# Patient Record
Sex: Male | Born: 1960
Health system: Southern US, Community
[De-identification: ages and names within clinical notes are randomized; demographics above are authoritative.]

## PROBLEM LIST (undated history)

## (undated) DIAGNOSIS — I639 Cerebral infarction, unspecified: Secondary | ICD-10-CM

## (undated) DIAGNOSIS — F329 Major depressive disorder, single episode, unspecified: Secondary | ICD-10-CM

## (undated) DIAGNOSIS — R079 Chest pain, unspecified: Secondary | ICD-10-CM

## (undated) DIAGNOSIS — F32A Depression, unspecified: Secondary | ICD-10-CM

## (undated) DIAGNOSIS — D751 Secondary polycythemia: Secondary | ICD-10-CM

## (undated) DIAGNOSIS — R972 Elevated prostate specific antigen [PSA]: Secondary | ICD-10-CM

## (undated) DIAGNOSIS — I729 Aneurysm of unspecified site: Secondary | ICD-10-CM

## (undated) DIAGNOSIS — E785 Hyperlipidemia, unspecified: Secondary | ICD-10-CM

## (undated) DIAGNOSIS — R945 Abnormal results of liver function studies: Secondary | ICD-10-CM

## (undated) DIAGNOSIS — E349 Endocrine disorder, unspecified: Secondary | ICD-10-CM

## (undated) DIAGNOSIS — Z8669 Personal history of other diseases of the nervous system and sense organs: Secondary | ICD-10-CM

## (undated) DIAGNOSIS — H409 Unspecified glaucoma: Secondary | ICD-10-CM

## (undated) DIAGNOSIS — I82409 Acute embolism and thrombosis of unspecified deep veins of unspecified lower extremity: Secondary | ICD-10-CM

## (undated) DIAGNOSIS — R51 Headache: Secondary | ICD-10-CM

## (undated) DIAGNOSIS — F419 Anxiety disorder, unspecified: Secondary | ICD-10-CM

## (undated) DIAGNOSIS — E119 Type 2 diabetes mellitus without complications: Secondary | ICD-10-CM

## (undated) DIAGNOSIS — F909 Attention-deficit hyperactivity disorder, unspecified type: Secondary | ICD-10-CM

## (undated) DIAGNOSIS — R519 Headache, unspecified: Secondary | ICD-10-CM

## (undated) DIAGNOSIS — R9082 White matter disease, unspecified: Secondary | ICD-10-CM

## (undated) DIAGNOSIS — I10 Essential (primary) hypertension: Secondary | ICD-10-CM

## (undated) DIAGNOSIS — C8370 Burkitt lymphoma, unspecified site: Secondary | ICD-10-CM

## (undated) HISTORY — DX: Hyperlipidemia, unspecified: E78.5

## (undated) HISTORY — PX: SHOULDER SURGERY: SHX246

## (undated) HISTORY — DX: Essential (primary) hypertension: I10

## (undated) HISTORY — PX: KNEE SURGERY: SHX244

## (undated) HISTORY — DX: Abnormal results of liver function studies: R94.5

## (undated) HISTORY — DX: Burkitt lymphoma, unspecified site: C83.70

## (undated) HISTORY — DX: Chest pain, unspecified: R07.9

## (undated) HISTORY — PX: HERNIA REPAIR: SHX51

## (undated) HISTORY — DX: Secondary polycythemia: D75.1

---

## 2005-06-21 ENCOUNTER — Emergency Department (HOSPITAL_COMMUNITY): Admission: EM | Admit: 2005-06-21 | Discharge: 2005-06-22 | Payer: Self-pay | Admitting: Emergency Medicine

## 2005-06-23 ENCOUNTER — Ambulatory Visit: Payer: Self-pay | Admitting: Oncology

## 2005-06-26 ENCOUNTER — Inpatient Hospital Stay (HOSPITAL_COMMUNITY): Admission: EM | Admit: 2005-06-26 | Discharge: 2005-07-30 | Payer: Self-pay | Admitting: Emergency Medicine

## 2005-06-26 ENCOUNTER — Ambulatory Visit: Payer: Self-pay | Admitting: Oncology

## 2005-06-29 ENCOUNTER — Encounter (INDEPENDENT_AMBULATORY_CARE_PROVIDER_SITE_OTHER): Payer: Self-pay | Admitting: *Deleted

## 2005-06-29 ENCOUNTER — Encounter: Payer: Self-pay | Admitting: Oncology

## 2005-06-30 ENCOUNTER — Encounter (INDEPENDENT_AMBULATORY_CARE_PROVIDER_SITE_OTHER): Payer: Self-pay | Admitting: Cardiology

## 2005-07-09 ENCOUNTER — Encounter (INDEPENDENT_AMBULATORY_CARE_PROVIDER_SITE_OTHER): Payer: Self-pay | Admitting: *Deleted

## 2005-08-03 ENCOUNTER — Ambulatory Visit: Payer: Self-pay | Admitting: Oncology

## 2005-08-03 ENCOUNTER — Inpatient Hospital Stay (HOSPITAL_COMMUNITY): Admission: AD | Admit: 2005-08-03 | Discharge: 2005-08-13 | Payer: Self-pay | Admitting: Oncology

## 2005-08-05 ENCOUNTER — Encounter: Payer: Self-pay | Admitting: Vascular Surgery

## 2005-08-07 ENCOUNTER — Encounter (INDEPENDENT_AMBULATORY_CARE_PROVIDER_SITE_OTHER): Payer: Self-pay | Admitting: *Deleted

## 2005-08-18 ENCOUNTER — Ambulatory Visit: Payer: Self-pay | Admitting: Oncology

## 2005-08-18 LAB — CBC WITH DIFFERENTIAL/PLATELET
BASO%: 0 % (ref 0.0–2.0)
EOS%: 0.3 % (ref 0.0–7.0)
MCH: 28.9 pg (ref 28.0–33.4)
MCHC: 34.7 g/dL (ref 32.0–35.9)
MONO#: 1.6 10*3/uL — ABNORMAL HIGH (ref 0.1–0.9)
RBC: 3.72 10*6/uL — ABNORMAL LOW (ref 4.20–5.71)
RDW: 15.4 % — ABNORMAL HIGH (ref 11.2–14.6)
WBC: 21 10*3/uL — ABNORMAL HIGH (ref 4.0–10.0)
lymph#: 0.9 10*3/uL (ref 0.9–3.3)

## 2005-08-18 LAB — COMPREHENSIVE METABOLIC PANEL
Albumin: 3.8 g/dL (ref 3.5–5.2)
Alkaline Phosphatase: 86 U/L (ref 39–117)
BUN: 8 mg/dL (ref 6–23)
CO2: 24 mEq/L (ref 19–32)
Glucose, Bld: 102 mg/dL — ABNORMAL HIGH (ref 70–99)
Potassium: 3 mEq/L — ABNORMAL LOW (ref 3.5–5.3)
Sodium: 142 mEq/L (ref 135–145)
Total Bilirubin: 0.6 mg/dL (ref 0.3–1.2)
Total Protein: 6.7 g/dL (ref 6.0–8.3)

## 2005-08-18 LAB — LACTATE DEHYDROGENASE: LDH: 527 U/L — ABNORMAL HIGH (ref 94–250)

## 2005-08-18 LAB — URIC ACID: Uric Acid, Serum: 6.6 mg/dL (ref 2.4–7.0)

## 2005-08-19 ENCOUNTER — Inpatient Hospital Stay (HOSPITAL_COMMUNITY): Admission: EM | Admit: 2005-08-19 | Discharge: 2005-09-10 | Payer: Self-pay | Admitting: Oncology

## 2005-08-19 ENCOUNTER — Encounter (INDEPENDENT_AMBULATORY_CARE_PROVIDER_SITE_OTHER): Payer: Self-pay | Admitting: Specialist

## 2005-08-19 LAB — COMPREHENSIVE METABOLIC PANEL
Albumin: 3.1 g/dL — ABNORMAL LOW (ref 3.5–5.2)
CO2: 26 mEq/L (ref 19–32)
Calcium: 8.8 mg/dL (ref 8.4–10.5)
Sodium: 143 mEq/L (ref 135–145)

## 2005-09-14 ENCOUNTER — Encounter (INDEPENDENT_AMBULATORY_CARE_PROVIDER_SITE_OTHER): Payer: Self-pay | Admitting: *Deleted

## 2005-09-14 ENCOUNTER — Ambulatory Visit: Payer: Self-pay | Admitting: Oncology

## 2005-09-14 ENCOUNTER — Inpatient Hospital Stay (HOSPITAL_COMMUNITY): Admission: EM | Admit: 2005-09-14 | Discharge: 2005-09-18 | Payer: Self-pay | Admitting: Oncology

## 2005-09-14 LAB — COMPREHENSIVE METABOLIC PANEL
AST: 24 U/L (ref 0–37)
Albumin: 4.2 g/dL (ref 3.5–5.2)
BUN: 13 mg/dL (ref 6–23)
Calcium: 10 mg/dL (ref 8.4–10.5)
Chloride: 103 mEq/L (ref 96–112)
Potassium: 3.7 mEq/L (ref 3.5–5.3)
Sodium: 138 mEq/L (ref 135–145)
Total Protein: 7.3 g/dL (ref 6.0–8.3)

## 2005-09-14 LAB — CBC WITH DIFFERENTIAL/PLATELET
Basophils Absolute: 0 10*3/uL (ref 0.0–0.1)
EOS%: 1.6 % (ref 0.0–7.0)
Eosinophils Absolute: 0.1 10*3/uL (ref 0.0–0.5)
HGB: 12.2 g/dL — ABNORMAL LOW (ref 13.0–17.1)
MCH: 29.4 pg (ref 28.0–33.4)
NEUT#: 5.8 10*3/uL (ref 1.5–6.5)
RDW: 15.8 % — ABNORMAL HIGH (ref 11.2–14.6)
lymph#: 1.3 10*3/uL (ref 0.9–3.3)

## 2005-09-20 ENCOUNTER — Inpatient Hospital Stay (HOSPITAL_COMMUNITY): Admit: 2005-09-20 | Discharge: 2005-10-01 | Payer: Self-pay | Admitting: Oncology

## 2005-09-28 ENCOUNTER — Encounter (INDEPENDENT_AMBULATORY_CARE_PROVIDER_SITE_OTHER): Payer: Self-pay | Admitting: Specialist

## 2005-09-28 ENCOUNTER — Encounter: Payer: Self-pay | Admitting: Oncology

## 2005-10-05 ENCOUNTER — Inpatient Hospital Stay (HOSPITAL_COMMUNITY): Admission: AD | Admit: 2005-10-05 | Discharge: 2005-10-14 | Payer: Self-pay | Admitting: Oncology

## 2005-10-06 ENCOUNTER — Encounter (INDEPENDENT_AMBULATORY_CARE_PROVIDER_SITE_OTHER): Payer: Self-pay | Admitting: *Deleted

## 2005-10-14 ENCOUNTER — Ambulatory Visit: Payer: Self-pay | Admitting: Oncology

## 2005-10-14 LAB — CBC WITH DIFFERENTIAL/PLATELET
BASO%: 0 % (ref 0.0–2.0)
EOS%: 0.5 % (ref 0.0–7.0)
LYMPH%: 4 % — ABNORMAL LOW (ref 14.0–48.0)
MCH: 30 pg (ref 28.0–33.4)
MCHC: 35.2 g/dL (ref 32.0–35.9)
MCV: 85.2 fL (ref 81.6–98.0)
MONO%: 0.3 % (ref 0.0–13.0)
NEUT%: 95.2 % — ABNORMAL HIGH (ref 40.0–75.0)
Platelets: 129 10*3/uL — ABNORMAL LOW (ref 145–400)
RBC: 2.85 10*6/uL — ABNORMAL LOW (ref 4.20–5.71)
WBC: 3 10*3/uL — ABNORMAL LOW (ref 4.0–10.0)

## 2005-10-14 LAB — MORPHOLOGY

## 2005-10-19 ENCOUNTER — Inpatient Hospital Stay (HOSPITAL_COMMUNITY): Admission: EM | Admit: 2005-10-19 | Discharge: 2005-10-25 | Payer: Self-pay | Admitting: Emergency Medicine

## 2005-10-27 LAB — CBC WITH DIFFERENTIAL/PLATELET
Eosinophils Absolute: 0 10*3/uL (ref 0.0–0.5)
HCT: 28 % — ABNORMAL LOW (ref 38.7–49.9)
LYMPH%: 4.1 % — ABNORMAL LOW (ref 14.0–48.0)
MCHC: 35.1 g/dL (ref 32.0–35.9)
MCV: 84.7 fL (ref 81.6–98.0)
MONO%: 6.2 % (ref 0.0–13.0)
NEUT#: 18.5 10*3/uL — ABNORMAL HIGH (ref 1.5–6.5)
NEUT%: 89.1 % — ABNORMAL HIGH (ref 40.0–75.0)
Platelets: 70 10*3/uL — ABNORMAL LOW (ref 145–400)
RBC: 3.3 10*6/uL — ABNORMAL LOW (ref 4.20–5.71)

## 2005-10-27 LAB — COMPREHENSIVE METABOLIC PANEL
Albumin: 3.7 g/dL (ref 3.5–5.2)
CO2: 26 mEq/L (ref 19–32)
Calcium: 8.4 mg/dL (ref 8.4–10.5)
Chloride: 109 mEq/L (ref 96–112)
Glucose, Bld: 100 mg/dL — ABNORMAL HIGH (ref 70–99)
Potassium: 3.4 mEq/L — ABNORMAL LOW (ref 3.5–5.3)
Sodium: 143 mEq/L (ref 135–145)
Total Bilirubin: 0.5 mg/dL (ref 0.3–1.2)
Total Protein: 5.5 g/dL — ABNORMAL LOW (ref 6.0–8.3)

## 2005-10-27 LAB — LACTATE DEHYDROGENASE: LDH: 410 U/L — ABNORMAL HIGH (ref 94–250)

## 2005-10-27 LAB — MORPHOLOGY

## 2005-10-28 ENCOUNTER — Inpatient Hospital Stay (HOSPITAL_COMMUNITY): Admission: EM | Admit: 2005-10-28 | Discharge: 2005-11-03 | Payer: Self-pay | Admitting: Oncology

## 2005-10-28 ENCOUNTER — Encounter (INDEPENDENT_AMBULATORY_CARE_PROVIDER_SITE_OTHER): Payer: Self-pay | Admitting: Specialist

## 2005-10-28 ENCOUNTER — Ambulatory Visit: Payer: Self-pay | Admitting: Internal Medicine

## 2005-11-04 ENCOUNTER — Inpatient Hospital Stay (HOSPITAL_COMMUNITY): Admission: EM | Admit: 2005-11-04 | Discharge: 2005-11-12 | Payer: Self-pay | Admitting: Oncology

## 2005-11-13 LAB — CBC WITH DIFFERENTIAL/PLATELET
BASO%: 0.4 % (ref 0.0–2.0)
Basophils Absolute: 0.1 10*3/uL (ref 0.0–0.1)
EOS%: 0 % (ref 0.0–7.0)
HCT: 30.7 % — ABNORMAL LOW (ref 38.7–49.9)
HGB: 10.8 g/dL — ABNORMAL LOW (ref 13.0–17.1)
LYMPH%: 2.9 % — ABNORMAL LOW (ref 14.0–48.0)
MCH: 29.7 pg (ref 28.0–33.4)
MCHC: 35.1 g/dL (ref 32.0–35.9)
NEUT%: 94.8 % — ABNORMAL HIGH (ref 40.0–75.0)
Platelets: 93 10*3/uL — ABNORMAL LOW (ref 145–400)

## 2005-11-13 LAB — PROTIME-INR
INR: 1.5 — ABNORMAL LOW (ref 2.00–3.50)
Protime: 18 Seconds — ABNORMAL HIGH (ref 10.6–13.4)

## 2005-11-13 LAB — BASIC METABOLIC PANEL
BUN: 13 mg/dL (ref 6–23)
Creatinine, Ser: 1.92 mg/dL — ABNORMAL HIGH (ref 0.40–1.50)

## 2005-11-17 LAB — CBC WITH DIFFERENTIAL/PLATELET
BASO%: 0.7 % (ref 0.0–2.0)
Eosinophils Absolute: 0.1 10*3/uL (ref 0.0–0.5)
MCHC: UNDETERMINED g/dL (ref 32.0–35.9)
MONO#: 0.8 10*3/uL (ref 0.1–0.9)
MONO%: 2.9 % (ref 0.0–13.0)
NEUT#: 24.1 10*3/uL — ABNORMAL HIGH (ref 1.5–6.5)
RBC: UNDETERMINED 10*6/uL (ref 4.20–5.71)
RDW: 12.9 % (ref 11.2–14.6)
WBC: 26.3 10*3/uL — ABNORMAL HIGH (ref 4.0–10.0)

## 2005-11-17 LAB — CHCC SMEAR

## 2005-11-17 LAB — PROTIME-INR: Protime: 21.6 Seconds — ABNORMAL HIGH (ref 10.6–13.4)

## 2005-11-27 ENCOUNTER — Ambulatory Visit: Admission: RE | Admit: 2005-11-27 | Discharge: 2006-01-10 | Payer: Self-pay | Admitting: Radiation Oncology

## 2005-11-30 ENCOUNTER — Ambulatory Visit: Payer: Self-pay | Admitting: Oncology

## 2005-12-01 LAB — PROTIME-INR: Protime: 15.6 Seconds — ABNORMAL HIGH (ref 10.6–13.4)

## 2005-12-02 ENCOUNTER — Ambulatory Visit (HOSPITAL_COMMUNITY): Admission: RE | Admit: 2005-12-02 | Discharge: 2005-12-02 | Payer: Self-pay | Admitting: Oncology

## 2005-12-07 LAB — CBC WITH DIFFERENTIAL/PLATELET
BASO%: 0.4 % (ref 0.0–2.0)
Basophils Absolute: 0.1 10*3/uL (ref 0.0–0.1)
EOS%: 0 % (ref 0.0–7.0)
HCT: 34.2 % — ABNORMAL LOW (ref 38.7–49.9)
HGB: 11.7 g/dL — ABNORMAL LOW (ref 13.0–17.1)
MCH: 33.5 pg — ABNORMAL HIGH (ref 28.0–33.4)
MONO#: 0.6 10*3/uL (ref 0.1–0.9)
NEUT%: 94.2 % — ABNORMAL HIGH (ref 40.0–75.0)
RDW: 28.7 % — ABNORMAL HIGH (ref 11.2–14.6)
WBC: 23.3 10*3/uL — ABNORMAL HIGH (ref 4.0–10.0)
lymph#: 0.7 10*3/uL — ABNORMAL LOW (ref 0.9–3.3)

## 2005-12-07 LAB — MORPHOLOGY: PLT EST: ADEQUATE

## 2005-12-15 LAB — COMPREHENSIVE METABOLIC PANEL
AST: 11 U/L (ref 0–37)
BUN: 24 mg/dL — ABNORMAL HIGH (ref 6–23)
Calcium: 9.3 mg/dL (ref 8.4–10.5)
Chloride: 105 mEq/L (ref 96–112)
Creatinine, Ser: 1.04 mg/dL (ref 0.40–1.50)

## 2005-12-15 LAB — CBC WITH DIFFERENTIAL/PLATELET
Basophils Absolute: 0 10*3/uL (ref 0.0–0.1)
Eosinophils Absolute: 0 10*3/uL (ref 0.0–0.5)
HGB: 11.9 g/dL — ABNORMAL LOW (ref 13.0–17.1)
LYMPH%: 5.3 % — ABNORMAL LOW (ref 14.0–48.0)
MCV: 99.8 fL — ABNORMAL HIGH (ref 81.6–98.0)
MONO%: 4.5 % (ref 0.0–13.0)
NEUT#: 12.4 10*3/uL — ABNORMAL HIGH (ref 1.5–6.5)
Platelets: 195 10*3/uL (ref 145–400)

## 2005-12-15 LAB — PROTIME-INR

## 2005-12-15 LAB — MORPHOLOGY: PLT EST: ADEQUATE

## 2005-12-15 LAB — PROTHROMBIN TIME
INR: 6.2 (ref 0.0–1.5)
Prothrombin Time: 59.5 seconds — ABNORMAL HIGH (ref 11.6–15.2)

## 2005-12-17 ENCOUNTER — Ambulatory Visit: Payer: Self-pay | Admitting: Oncology

## 2005-12-17 ENCOUNTER — Encounter (INDEPENDENT_AMBULATORY_CARE_PROVIDER_SITE_OTHER): Payer: Self-pay | Admitting: *Deleted

## 2005-12-17 ENCOUNTER — Ambulatory Visit (HOSPITAL_COMMUNITY): Admission: RE | Admit: 2005-12-17 | Discharge: 2005-12-17 | Payer: Self-pay | Admitting: Oncology

## 2005-12-17 ENCOUNTER — Encounter: Payer: Self-pay | Admitting: Oncology

## 2005-12-28 ENCOUNTER — Ambulatory Visit: Payer: Self-pay | Admitting: Oncology

## 2005-12-28 LAB — MORPHOLOGY: PLT EST: ADEQUATE

## 2005-12-28 LAB — CBC WITH DIFFERENTIAL/PLATELET
BASO%: 1.1 % (ref 0.0–2.0)
HCT: 33.6 % — ABNORMAL LOW (ref 38.7–49.9)
MCHC: 35.9 g/dL (ref 32.0–35.9)
MONO#: 0.5 10*3/uL (ref 0.1–0.9)
NEUT%: 52.7 % (ref 40.0–75.0)
RBC: 3.5 10*6/uL — ABNORMAL LOW (ref 4.20–5.71)
RDW: 18.5 % — ABNORMAL HIGH (ref 11.2–14.6)
WBC: 4.1 10*3/uL (ref 4.0–10.0)
lymph#: 1.3 10*3/uL (ref 0.9–3.3)

## 2006-01-04 LAB — CBC WITH DIFFERENTIAL/PLATELET
Basophils Absolute: 0 10*3/uL (ref 0.0–0.1)
EOS%: 1.8 % (ref 0.0–7.0)
HCT: 34.6 % — ABNORMAL LOW (ref 38.7–49.9)
HGB: 12.7 g/dL — ABNORMAL LOW (ref 13.0–17.1)
LYMPH%: 64.7 % — ABNORMAL HIGH (ref 14.0–48.0)
MCH: 35.4 pg — ABNORMAL HIGH (ref 28.0–33.4)
MCV: 96.7 fL (ref 81.6–98.0)
MONO%: 28.8 % — ABNORMAL HIGH (ref 0.0–13.0)
NEUT%: 3.8 % — ABNORMAL LOW (ref 40.0–75.0)

## 2006-01-07 LAB — CHCC SMEAR

## 2006-01-07 LAB — CBC WITH DIFFERENTIAL/PLATELET
BASO%: 0.6 % (ref 0.0–2.0)
Basophils Absolute: 0 10*3/uL (ref 0.0–0.1)
EOS%: 0.7 % (ref 0.0–7.0)
Eosinophils Absolute: 0 10*3/uL (ref 0.0–0.5)
HCT: 37.4 % — ABNORMAL LOW (ref 38.7–49.9)
HGB: 13.1 g/dL (ref 13.0–17.1)
LYMPH%: 71 % — ABNORMAL HIGH (ref 14.0–48.0)
MCH: 35.1 pg — ABNORMAL HIGH (ref 28.0–33.4)
MCHC: 35 g/dL (ref 32.0–35.9)
MCV: 100.4 fL — ABNORMAL HIGH (ref 81.6–98.0)
MONO#: 0.9 10*3/uL (ref 0.1–0.9)
MONO%: 24.6 % — ABNORMAL HIGH (ref 0.0–13.0)
NEUT#: 0.1 10*3/uL — CL (ref 1.5–6.5)
NEUT%: 3.1 % — ABNORMAL LOW (ref 40.0–75.0)
Platelets: 280 10*3/uL (ref 145–400)
RBC: 3.72 10*6/uL — ABNORMAL LOW (ref 4.20–5.71)
RDW: 20.9 % — ABNORMAL HIGH (ref 11.2–14.6)
WBC: 3.5 10*3/uL — ABNORMAL LOW (ref 4.0–10.0)
lymph#: 2.5 10*3/uL (ref 0.9–3.3)

## 2006-01-07 LAB — LACTATE DEHYDROGENASE: LDH: 203 U/L (ref 94–250)

## 2006-01-07 LAB — MORPHOLOGY
Blasts: 66 % (ref 0–0)
PLT EST: ADEQUATE

## 2006-01-11 LAB — PROTIME-INR

## 2006-01-18 LAB — CBC WITH DIFFERENTIAL/PLATELET
BASO%: 0.5 % (ref 0.0–2.0)
Eosinophils Absolute: 0 10*3/uL (ref 0.0–0.5)
LYMPH%: 27.6 % (ref 14.0–48.0)
MCHC: 34.8 g/dL (ref 32.0–35.9)
MONO#: 0.3 10*3/uL (ref 0.1–0.9)
NEUT#: 3.9 10*3/uL (ref 1.5–6.5)
Platelets: 210 10*3/uL (ref 145–400)
RBC: 3.47 10*6/uL — ABNORMAL LOW (ref 4.20–5.71)
RDW: 18.6 % — ABNORMAL HIGH (ref 11.2–14.6)
WBC: 6 10*3/uL (ref 4.0–10.0)
lymph#: 1.7 10*3/uL (ref 0.9–3.3)

## 2006-01-18 LAB — PROTIME-INR: Protime: 16.8 Seconds — ABNORMAL HIGH (ref 10.6–13.4)

## 2006-01-18 LAB — MORPHOLOGY
PLT EST: ADEQUATE
RBC Comments: NORMAL

## 2006-01-18 LAB — CHCC SMEAR

## 2006-01-21 LAB — CYTOMEGALOVIRUS PCR, QUALITATIVE: Cytomegalovirus DNA: NOT DETECTED

## 2006-02-01 LAB — PROTIME-INR: INR: 3.9 — ABNORMAL HIGH (ref 2.00–3.50)

## 2006-02-08 LAB — COMPREHENSIVE METABOLIC PANEL
Albumin: 4.6 g/dL (ref 3.5–5.2)
BUN: 24 mg/dL — ABNORMAL HIGH (ref 6–23)
CO2: 24 mEq/L (ref 19–32)
Calcium: 9.6 mg/dL (ref 8.4–10.5)
Chloride: 104 mEq/L (ref 96–112)
Creatinine, Ser: 1.07 mg/dL (ref 0.40–1.50)
Glucose, Bld: 91 mg/dL (ref 70–99)
Potassium: 4.1 mEq/L (ref 3.5–5.3)

## 2006-02-08 LAB — LACTATE DEHYDROGENASE: LDH: 167 U/L (ref 94–250)

## 2006-02-08 LAB — CBC WITH DIFFERENTIAL/PLATELET
Basophils Absolute: 0 10*3/uL (ref 0.0–0.1)
EOS%: 1.1 % (ref 0.0–7.0)
Eosinophils Absolute: 0.1 10*3/uL (ref 0.0–0.5)
HGB: 13 g/dL (ref 13.0–17.1)
LYMPH%: 20.4 % (ref 14.0–48.0)
MCH: 35.9 pg — ABNORMAL HIGH (ref 28.0–33.4)
MCV: 103 fL — ABNORMAL HIGH (ref 81.6–98.0)
MONO%: 6 % (ref 0.0–13.0)
NEUT#: 3.6 10*3/uL (ref 1.5–6.5)
Platelets: 225 10*3/uL (ref 145–400)

## 2006-02-11 ENCOUNTER — Ambulatory Visit: Payer: Self-pay | Admitting: Oncology

## 2006-02-15 LAB — CBC WITH DIFFERENTIAL/PLATELET
Eosinophils Absolute: 0.1 10*3/uL (ref 0.0–0.5)
MCV: 101.4 fL — ABNORMAL HIGH (ref 81.6–98.0)
MONO%: 6.9 % (ref 0.0–13.0)
NEUT#: 4.3 10*3/uL (ref 1.5–6.5)
RBC: 3.69 10*6/uL — ABNORMAL LOW (ref 4.20–5.71)
RDW: 13 % (ref 11.2–14.6)
WBC: 6 10*3/uL (ref 4.0–10.0)
lymph#: 1.1 10*3/uL (ref 0.9–3.3)

## 2006-02-15 LAB — MORPHOLOGY
PLT EST: ADEQUATE
RBC Comments: NORMAL

## 2006-03-08 LAB — CBC WITH DIFFERENTIAL/PLATELET
BASO%: 1.6 % (ref 0.0–2.0)
Eosinophils Absolute: 0.1 10*3/uL (ref 0.0–0.5)
MCHC: 36.1 g/dL — ABNORMAL HIGH (ref 32.0–35.9)
MONO#: 0.4 10*3/uL (ref 0.1–0.9)
NEUT#: 0.6 10*3/uL — ABNORMAL LOW (ref 1.5–6.5)
RBC: 3.98 10*6/uL — ABNORMAL LOW (ref 4.20–5.71)
RDW: 10.3 % — ABNORMAL LOW (ref 11.2–14.6)
WBC: 2.5 10*3/uL — ABNORMAL LOW (ref 4.0–10.0)
lymph#: 1.4 10*3/uL (ref 0.9–3.3)

## 2006-03-08 LAB — MORPHOLOGY: PLT EST: ADEQUATE

## 2006-03-15 LAB — CBC WITH DIFFERENTIAL/PLATELET
BASO%: 0 % (ref 0.0–2.0)
EOS%: 0.4 % (ref 0.0–7.0)
HCT: 39 % (ref 38.7–49.9)
MCH: 34.9 pg — ABNORMAL HIGH (ref 28.0–33.4)
MCHC: 35.9 g/dL (ref 32.0–35.9)
NEUT%: 65.1 % (ref 40.0–75.0)
RBC: 4.01 10*6/uL — ABNORMAL LOW (ref 4.20–5.71)
RDW: 12.7 % (ref 11.2–14.6)
lymph#: 1.3 10*3/uL (ref 0.9–3.3)

## 2006-04-06 ENCOUNTER — Ambulatory Visit: Payer: Self-pay | Admitting: Oncology

## 2006-04-12 LAB — CBC WITH DIFFERENTIAL/PLATELET
BASO%: 0.2 % (ref 0.0–2.0)
Basophils Absolute: 0 10*3/uL (ref 0.0–0.1)
EOS%: 0.9 % (ref 0.0–7.0)
HGB: 12.8 g/dL — ABNORMAL LOW (ref 13.0–17.1)
MCH: 33.9 pg — ABNORMAL HIGH (ref 28.0–33.4)
RDW: 12.9 % (ref 11.2–14.6)
WBC: 3.8 10*3/uL — ABNORMAL LOW (ref 4.0–10.0)
lymph#: 0.8 10*3/uL — ABNORMAL LOW (ref 0.9–3.3)

## 2006-04-12 LAB — COMPREHENSIVE METABOLIC PANEL
AST: 19 U/L (ref 0–37)
Alkaline Phosphatase: 48 U/L (ref 39–117)
BUN: 15 mg/dL (ref 6–23)
Glucose, Bld: 88 mg/dL (ref 70–99)
Potassium: 4.3 mEq/L (ref 3.5–5.3)
Sodium: 143 mEq/L (ref 135–145)
Total Bilirubin: 0.8 mg/dL (ref 0.3–1.2)

## 2006-04-12 LAB — MORPHOLOGY: RBC Comments: NORMAL

## 2006-04-30 LAB — CBC WITH DIFFERENTIAL/PLATELET
Basophils Absolute: 0 10*3/uL (ref 0.0–0.1)
Eosinophils Absolute: 0.1 10*3/uL (ref 0.0–0.5)
HCT: 40 % (ref 38.7–49.9)
LYMPH%: 27.9 % (ref 14.0–48.0)
MCV: 94.1 fL (ref 81.6–98.0)
MONO#: 0.5 10*3/uL (ref 0.1–0.9)
MONO%: 14.1 % — ABNORMAL HIGH (ref 0.0–13.0)
NEUT#: 2 10*3/uL (ref 1.5–6.5)
NEUT%: 54.8 % (ref 40.0–75.0)
Platelets: 212 10*3/uL (ref 145–400)
WBC: 3.6 10*3/uL — ABNORMAL LOW (ref 4.0–10.0)

## 2006-05-14 LAB — CBC WITH DIFFERENTIAL/PLATELET
Basophils Absolute: 0 10*3/uL (ref 0.0–0.1)
Eosinophils Absolute: 0.2 10*3/uL (ref 0.0–0.5)
HCT: 39.3 % (ref 38.7–49.9)
HGB: 14.1 g/dL (ref 13.0–17.1)
LYMPH%: 15.9 % (ref 14.0–48.0)
MONO#: 0.4 10*3/uL (ref 0.1–0.9)
NEUT#: 5.9 10*3/uL (ref 1.5–6.5)
Platelets: 196 10*3/uL (ref 145–400)
RBC: 4.17 10*6/uL — ABNORMAL LOW (ref 4.20–5.71)
WBC: 7.7 10*3/uL (ref 4.0–10.0)

## 2006-05-14 LAB — MORPHOLOGY

## 2006-05-24 ENCOUNTER — Ambulatory Visit: Payer: Self-pay | Admitting: Oncology

## 2006-05-26 LAB — CBC WITH DIFFERENTIAL/PLATELET
BASO%: 0.4 % (ref 0.0–2.0)
Basophils Absolute: 0 10*3/uL (ref 0.0–0.1)
HCT: 38.5 % — ABNORMAL LOW (ref 38.7–49.9)
HGB: 13.7 g/dL (ref 13.0–17.1)
LYMPH%: 13.5 % — ABNORMAL LOW (ref 14.0–48.0)
MCHC: 35.6 g/dL (ref 32.0–35.9)
MONO#: 0.4 10*3/uL (ref 0.1–0.9)
NEUT%: 78 % — ABNORMAL HIGH (ref 40.0–75.0)
Platelets: 178 10*3/uL (ref 145–400)
WBC: 7.4 10*3/uL (ref 4.0–10.0)

## 2006-05-26 LAB — MORPHOLOGY

## 2006-06-11 LAB — MORPHOLOGY: PLT EST: ADEQUATE

## 2006-06-11 LAB — COMPREHENSIVE METABOLIC PANEL
ALT: 21 U/L (ref 0–53)
AST: 20 U/L (ref 0–37)
Albumin: 4.3 g/dL (ref 3.5–5.2)
Calcium: 9.1 mg/dL (ref 8.4–10.5)
Chloride: 109 mEq/L (ref 96–112)
Potassium: 3.8 mEq/L (ref 3.5–5.3)
Sodium: 145 mEq/L (ref 135–145)

## 2006-06-11 LAB — CBC WITH DIFFERENTIAL/PLATELET
Basophils Absolute: 0 10*3/uL (ref 0.0–0.1)
Eosinophils Absolute: 0.1 10*3/uL (ref 0.0–0.5)
HCT: 36.8 % — ABNORMAL LOW (ref 38.7–49.9)
HGB: 13 g/dL (ref 13.0–17.1)
MCH: 33.7 pg — ABNORMAL HIGH (ref 28.0–33.4)
MONO#: 0.4 10*3/uL (ref 0.1–0.9)
NEUT#: 5.3 10*3/uL (ref 1.5–6.5)
NEUT%: 79.6 % — ABNORMAL HIGH (ref 40.0–75.0)
RDW: 13.6 % (ref 11.2–14.6)
WBC: 6.7 10*3/uL (ref 4.0–10.0)
lymph#: 0.8 10*3/uL — ABNORMAL LOW (ref 0.9–3.3)

## 2006-07-14 ENCOUNTER — Ambulatory Visit: Payer: Self-pay | Admitting: Oncology

## 2006-07-16 LAB — CBC WITH DIFFERENTIAL/PLATELET
Basophils Absolute: 0 10*3/uL (ref 0.0–0.1)
EOS%: 1.7 % (ref 0.0–7.0)
LYMPH%: 22.3 % (ref 14.0–48.0)
MCH: 33.8 pg — ABNORMAL HIGH (ref 28.0–33.4)
MCV: 94.3 fL (ref 81.6–98.0)
MONO%: 14.5 % — ABNORMAL HIGH (ref 0.0–13.0)
Platelets: 189 10*3/uL (ref 145–400)
RBC: 3.94 10*6/uL — ABNORMAL LOW (ref 4.20–5.71)
RDW: 13.4 % (ref 11.2–14.6)

## 2006-07-16 LAB — MORPHOLOGY

## 2006-08-10 LAB — CBC WITH DIFFERENTIAL/PLATELET
BASO%: 0.6 % (ref 0.0–2.0)
Basophils Absolute: 0 10*3/uL (ref 0.0–0.1)
HCT: 39.6 % (ref 38.7–49.9)
HGB: 13.9 g/dL (ref 13.0–17.1)
LYMPH%: 14 % (ref 14.0–48.0)
MCHC: 35.2 g/dL (ref 32.0–35.9)
MONO#: 0.3 10*3/uL (ref 0.1–0.9)
NEUT%: 79.6 % — ABNORMAL HIGH (ref 40.0–75.0)
Platelets: 182 10*3/uL (ref 145–400)
WBC: 6.2 10*3/uL (ref 4.0–10.0)
lymph#: 0.9 10*3/uL (ref 0.9–3.3)

## 2006-08-10 LAB — COMPREHENSIVE METABOLIC PANEL
ALT: 27 U/L (ref 0–53)
BUN: 17 mg/dL (ref 6–23)
CO2: 25 mEq/L (ref 19–32)
Calcium: 9.3 mg/dL (ref 8.4–10.5)
Chloride: 106 mEq/L (ref 96–112)
Creatinine, Ser: 1.1 mg/dL (ref 0.40–1.50)
Glucose, Bld: 105 mg/dL — ABNORMAL HIGH (ref 70–99)
Total Bilirubin: 0.9 mg/dL (ref 0.3–1.2)

## 2006-08-10 LAB — LACTATE DEHYDROGENASE: LDH: 171 U/L (ref 94–250)

## 2006-09-30 ENCOUNTER — Ambulatory Visit: Payer: Self-pay | Admitting: Oncology

## 2006-10-05 LAB — CBC WITH DIFFERENTIAL/PLATELET
BASO%: 0.7 % (ref 0.0–2.0)
Eosinophils Absolute: 0 10*3/uL (ref 0.0–0.5)
MCHC: 36.1 g/dL — ABNORMAL HIGH (ref 32.0–35.9)
MCV: 94.8 fL (ref 81.6–98.0)
MONO#: 0.4 10*3/uL (ref 0.1–0.9)
MONO%: 5.9 % (ref 0.0–13.0)
NEUT#: 5.1 10*3/uL (ref 1.5–6.5)
RBC: 3.93 10*6/uL — ABNORMAL LOW (ref 4.20–5.71)
RDW: 13.6 % (ref 11.2–14.6)
WBC: 6.6 10*3/uL (ref 4.0–10.0)

## 2006-10-05 LAB — MORPHOLOGY

## 2006-12-13 ENCOUNTER — Ambulatory Visit: Payer: Self-pay | Admitting: Oncology

## 2006-12-15 LAB — LACTATE DEHYDROGENASE: LDH: 168 U/L (ref 94–250)

## 2006-12-15 LAB — CBC WITH DIFFERENTIAL/PLATELET
BASO%: 0.9 % (ref 0.0–2.0)
EOS%: 0.8 % (ref 0.0–7.0)
Eosinophils Absolute: 0.1 10*3/uL (ref 0.0–0.5)
LYMPH%: 15.3 % (ref 14.0–48.0)
MCHC: 35.6 g/dL (ref 32.0–35.9)
MCV: 93.1 fL (ref 81.6–98.0)
MONO%: 2.5 % (ref 0.0–13.0)
NEUT#: 6.2 10*3/uL (ref 1.5–6.5)
RBC: 4.4 10*6/uL (ref 4.20–5.71)
RDW: 13.3 % (ref 11.2–14.6)

## 2006-12-15 LAB — COMPREHENSIVE METABOLIC PANEL
ALT: 28 U/L (ref 0–53)
Albumin: 4.7 g/dL (ref 3.5–5.2)
CO2: 26 mEq/L (ref 19–32)
Glucose, Bld: 90 mg/dL (ref 70–99)
Potassium: 4.3 mEq/L (ref 3.5–5.3)
Sodium: 142 mEq/L (ref 135–145)
Total Protein: 6.6 g/dL (ref 6.0–8.3)

## 2006-12-15 LAB — URIC ACID: Uric Acid, Serum: 7.1 mg/dL — ABNORMAL HIGH (ref 2.4–7.0)

## 2007-02-02 ENCOUNTER — Ambulatory Visit: Payer: Self-pay | Admitting: Oncology

## 2007-02-08 LAB — CBC WITH DIFFERENTIAL/PLATELET
BASO%: 0.3 % (ref 0.0–2.0)
EOS%: 0.9 % (ref 0.0–7.0)
HCT: 36.7 % — ABNORMAL LOW (ref 38.7–49.9)
MCH: 32.4 pg (ref 28.0–33.4)
MCHC: 34.9 g/dL (ref 32.0–35.9)
MCV: 92.7 fL (ref 81.6–98.0)
MONO%: 6.3 % (ref 0.0–13.0)
NEUT%: 74 % (ref 40.0–75.0)
RDW: 14.5 % (ref 11.2–14.6)
lymph#: 1.3 10*3/uL (ref 0.9–3.3)

## 2007-02-08 LAB — MORPHOLOGY

## 2007-03-31 ENCOUNTER — Ambulatory Visit: Payer: Self-pay | Admitting: Oncology

## 2007-04-04 LAB — COMPREHENSIVE METABOLIC PANEL
ALT: 28 U/L (ref 0–53)
AST: 21 U/L (ref 0–37)
Chloride: 106 mEq/L (ref 96–112)
Creatinine, Ser: 1.13 mg/dL (ref 0.40–1.50)
Sodium: 141 mEq/L (ref 135–145)
Total Bilirubin: 0.5 mg/dL (ref 0.3–1.2)

## 2007-04-04 LAB — URIC ACID: Uric Acid, Serum: 6.9 mg/dL (ref 4.0–7.8)

## 2007-04-04 LAB — CBC WITH DIFFERENTIAL/PLATELET
BASO%: 0.1 % (ref 0.0–2.0)
EOS%: 0.8 % (ref 0.0–7.0)
HCT: 40.1 % (ref 38.7–49.9)
LYMPH%: 17.4 % (ref 14.0–48.0)
MCH: 31.4 pg (ref 28.0–33.4)
MCHC: 34.1 g/dL (ref 32.0–35.9)
NEUT%: 75.9 % — ABNORMAL HIGH (ref 40.0–75.0)
RBC: 4.35 10*6/uL (ref 4.20–5.71)
lymph#: 1.4 10*3/uL (ref 0.9–3.3)

## 2007-04-05 LAB — SEDIMENTATION RATE: Sed Rate: 15 mm/h (ref 0–16)

## 2007-08-03 ENCOUNTER — Ambulatory Visit: Payer: Self-pay | Admitting: Oncology

## 2007-08-08 LAB — CBC WITH DIFFERENTIAL/PLATELET
BASO%: 0.4 % (ref 0.0–2.0)
EOS%: 0.6 % (ref 0.0–7.0)
HCT: 43.7 % (ref 38.7–49.9)
LYMPH%: 20 % (ref 14.0–48.0)
MCH: 30.2 pg (ref 28.0–33.4)
MCHC: 34.1 g/dL (ref 32.0–35.9)
MONO#: 0.5 10*3/uL (ref 0.1–0.9)
NEUT%: 73.6 % (ref 40.0–75.0)
Platelets: 174 10*3/uL (ref 145–400)
RBC: 4.95 10*6/uL (ref 4.20–5.71)
WBC: 9.3 10*3/uL (ref 4.0–10.0)
lymph#: 1.9 10*3/uL (ref 0.9–3.3)

## 2007-08-08 LAB — MORPHOLOGY: PLT EST: ADEQUATE

## 2007-11-13 IMAGING — RF DG FLUORO RM 1-60 MIN
6 series · 6 of 6 positions shown · non-contrast
Comparison: none

CLINICAL DATA: 45 year-old-male with Blaire’Yixin lymphoma.   Request for diagnostic fluoroscopic guided lumbar puncture as well as methotrexate chemotherapy injection. 
 FLUOROSCOPIC GUIDED LUMBAR PUNCTURE FOR BOTH DIAGNOSTIC AND INTRATHECAL CHEMOTHERAPY INJECTION 07/09/05:

[Series 1: run · 1 of 1 slices shown (1 of 6)]
[im 1/1]
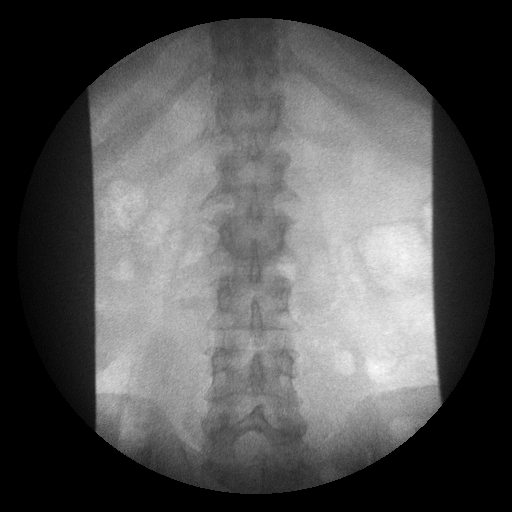

[Series 2: run · 1 of 1 slices shown (2 of 6)]
[im 1/1]
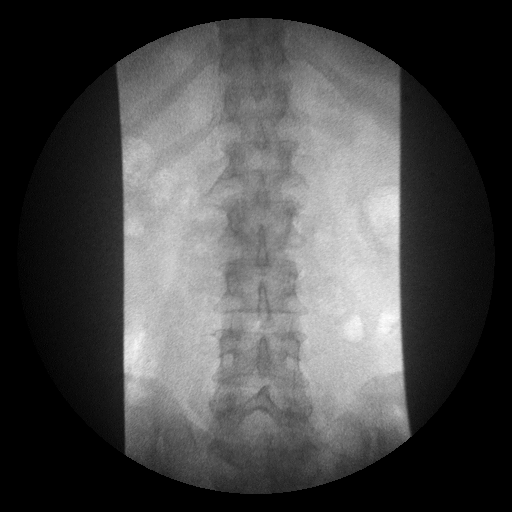

[Series 3: run · 1 of 1 slices shown (3 of 6)]
[im 1/1]
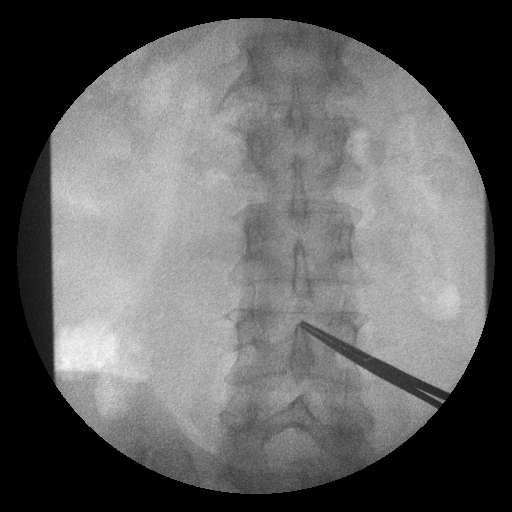

[Series 4: run · 1 of 1 slices shown (4 of 6)]
[im 1/1]
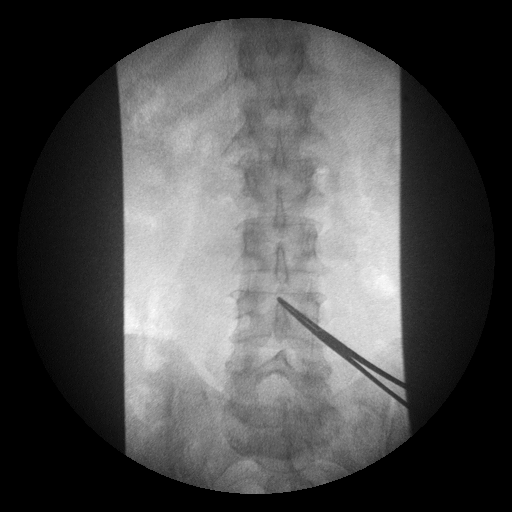

[Series 5: run · 1 of 1 slices shown (5 of 6)]
[im 1/1]
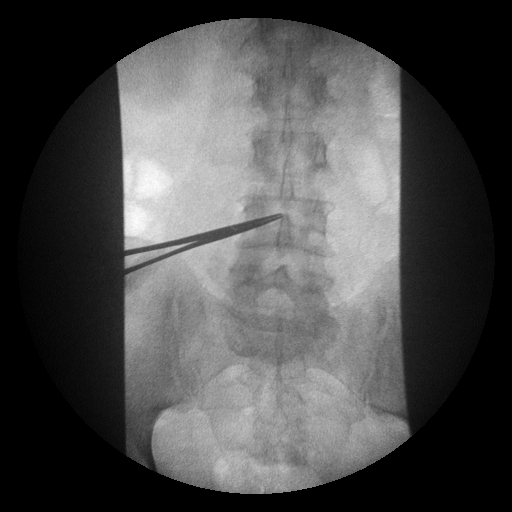

[Series 6: run · 1 of 1 slices shown (6 of 6)]
[im 1/1]
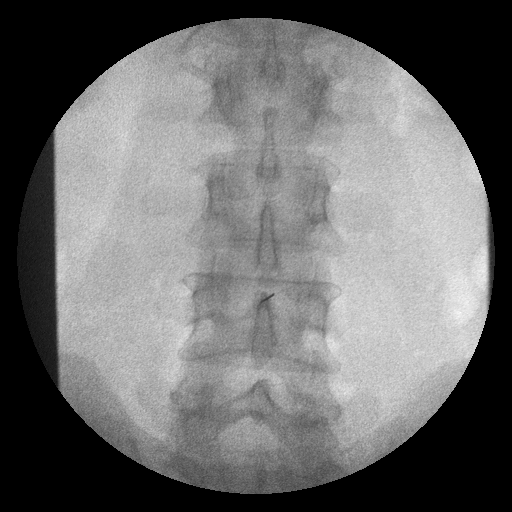

[6 of 6 positions shown; findings below may reference images not displayed]

FINDINGS: Written informed consent was obtained from the patient prior to the procedure.  
 In the prone position, the patient was prepped and draped in the normal sterile fashion, 1% lidocaine was used for local anesthesia.  A 22-gauge spinal needle was directed into the L3-4 subarachnoid space.  Opening pressure was noted to be 9 cm of water.  Approximately 7 cc of clear, colorless spinal fluid was removed.  At this time, chemotherapy was then injected slowly by Dr. Javii Dhir. Total volume of chemotherapy administered was 5cc. The patient tolerated the procedure well and there were no immediate complications.  The needle was removed without complication.  
 Dr. Javii Dhir personally assisted on this procedure.
IMPRESSION: Successful fluoroscopic guided lumbar puncture for both diagnostic purposes and intrathecal chemotherapy injection.

## 2007-11-22 ENCOUNTER — Encounter: Admission: RE | Admit: 2007-11-22 | Discharge: 2007-11-22 | Payer: Self-pay | Admitting: Internal Medicine

## 2007-11-22 ENCOUNTER — Emergency Department (HOSPITAL_COMMUNITY): Admission: EM | Admit: 2007-11-22 | Discharge: 2007-11-22 | Payer: Self-pay | Admitting: Emergency Medicine

## 2007-12-01 ENCOUNTER — Ambulatory Visit: Payer: Self-pay | Admitting: Oncology

## 2007-12-04 IMAGING — XA IR FLUORO GUIDE CV LINE*L*
1 series · 2 of 2 positions shown · non-contrast
Comparison: none

CLINICAL DATA: Lymphoma. 
ULTRASOUND GUIDANCE FOR VASCULAR ACCESS:
TUNNELED DOUBLE-LUMEN HICKMAN CATHETER INSERTION:
Radiologist:  Dr. Sushant Rajagopal. 
Guidance:  Ultrasound and fluoroscopic. 
Complications:  No immediate complications. 
Medications:  1 gram Ancef, 6 mg Versed, 100 mcg fentanyl. 
Conscious sedation time:  50 minutes. 
Procedure/Findings:  Informed consent was obtained from the patient. 
The patient has a superficial abscess and associated cellulitis in the posterior right neck, which is positive for MRSA.  He also had MRSA sepsis.  He has been afebrile for 72 hours and currently remains on vancomycin.  White count is elevated to [DATE].  After discussion with Dr. Arthalice, [place] IJ access will be performed for insertion of the tunneled catheter. 
Under sterile conditions and local anesthesia, micropuncture needle access was performed of the left internal jugular vein.  An 0.018 guidewire was advanced centrally.  A 4-French transitional dilator was inserted.  Measurements were obtained with a Bentson guidewire from the SVC/RA junction back to the venotomy site.  In the left infraclavicular chest, a subcutaneous tunnel was created over the left pectoralis major muscle.  The 12-French double-lumen Hickman catheter was pulled subcutaneously back to the venotomy site with the retention cuff at the chest incision.  The 12-French Hickman catheter was inserted through a peel-away sheath into the SVC/RA junction and confirmed with fluoroscopy.  Images were obtained for documentation.  The catheter was secured with a 0-Prolene suture and flushed with saline.  Venotomy site was closed with subcuticular 4-0 Vicryl suture and Dermabond. Dry, sterile dressing was applied to both sites. The patient tolerated the procedure well.  There were no immediate complications.

[Series 1000: run · 0.16mm/px · 2 of 2 slices shown]
[im 1/2]
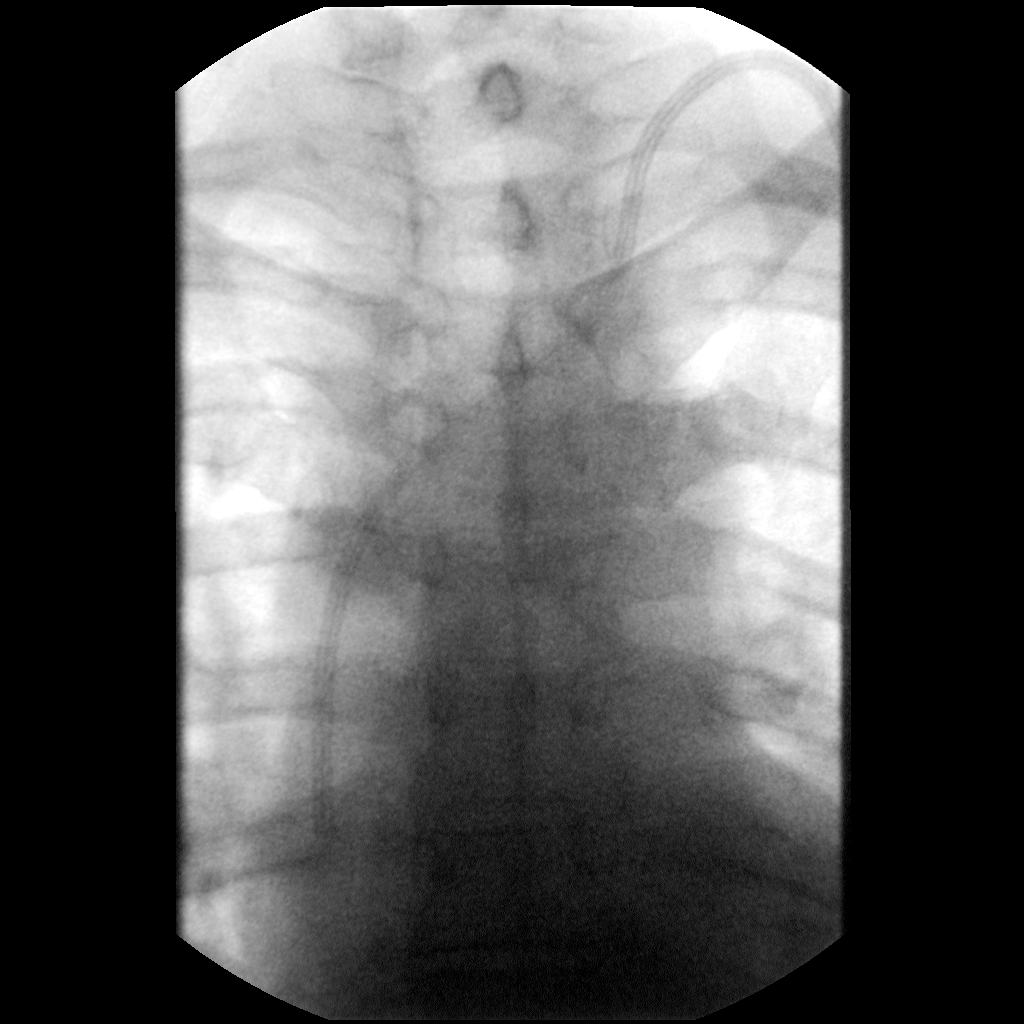
[im 2/2]
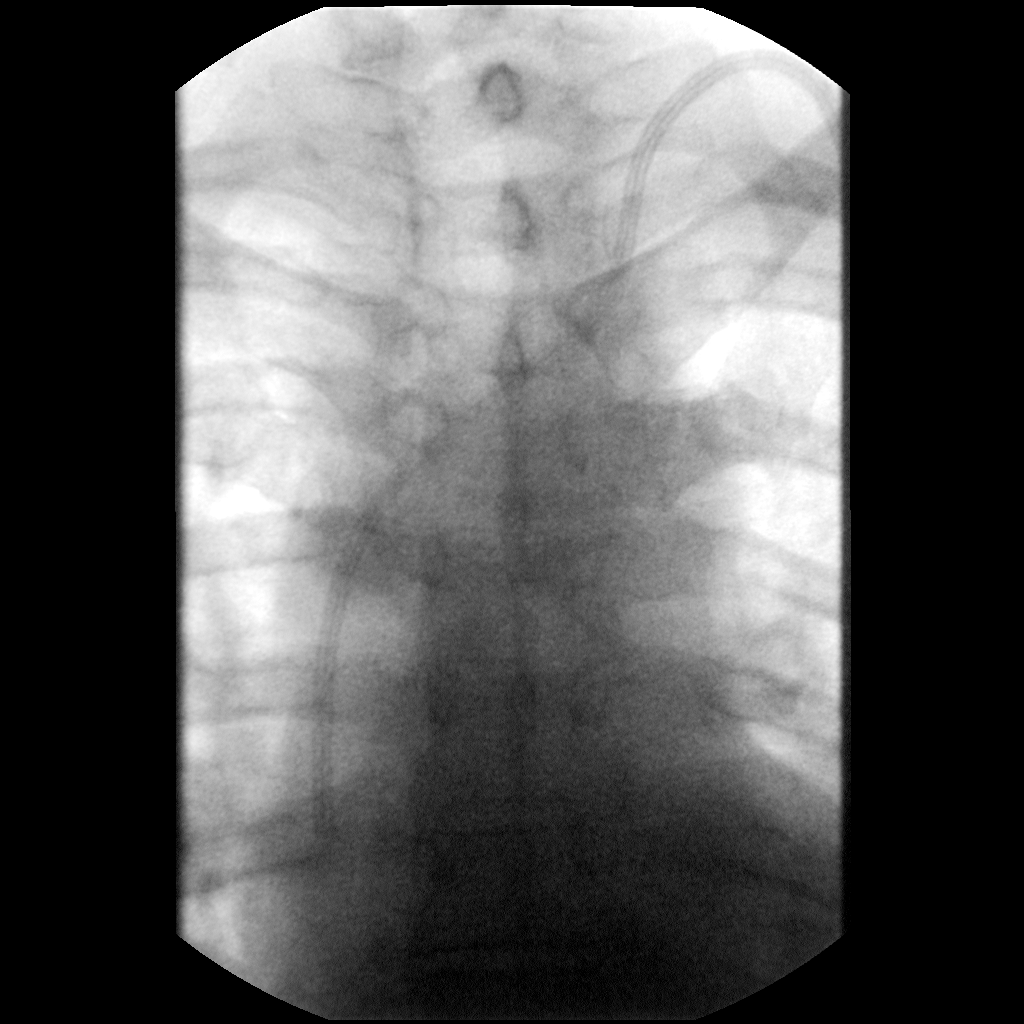

[2 of 2 positions shown; findings below may reference images not displayed]

IMPRESSION: Ultrasound and fluoroscopically guided left IJ double-lumen 12-French Hickman catheter insertion with the tip in the SVC/RA junction, ready for immediate use.

## 2007-12-06 LAB — COMPREHENSIVE METABOLIC PANEL
BUN: 21 mg/dL (ref 6–23)
CO2: 21 mEq/L (ref 19–32)
Calcium: 9.7 mg/dL (ref 8.4–10.5)
Chloride: 108 mEq/L (ref 96–112)
Creatinine, Ser: 1.03 mg/dL (ref 0.40–1.50)
Glucose, Bld: 121 mg/dL — ABNORMAL HIGH (ref 70–99)
Total Bilirubin: 0.6 mg/dL (ref 0.3–1.2)

## 2007-12-06 LAB — CBC WITH DIFFERENTIAL/PLATELET
Basophils Absolute: 0 10*3/uL (ref 0.0–0.1)
Eosinophils Absolute: 0.1 10*3/uL (ref 0.0–0.5)
HCT: 41 % (ref 38.7–49.9)
HGB: 13.9 g/dL (ref 13.0–17.1)
LYMPH%: 17.9 % (ref 14.0–48.0)
MCHC: 33.8 g/dL (ref 32.0–35.9)
MONO#: 0.3 10*3/uL (ref 0.1–0.9)
NEUT#: 5.8 10*3/uL (ref 1.5–6.5)
NEUT%: 76.5 % — ABNORMAL HIGH (ref 40.0–75.0)
Platelets: 211 10*3/uL (ref 145–400)
WBC: 7.5 10*3/uL (ref 4.0–10.0)
lymph#: 1.4 10*3/uL (ref 0.9–3.3)

## 2007-12-06 LAB — LACTATE DEHYDROGENASE: LDH: 166 U/L (ref 94–250)

## 2008-03-30 ENCOUNTER — Ambulatory Visit: Payer: Self-pay | Admitting: Oncology

## 2008-04-03 LAB — COMPREHENSIVE METABOLIC PANEL
Alkaline Phosphatase: 47 U/L (ref 39–117)
Creatinine, Ser: 1.11 mg/dL (ref 0.40–1.50)
Glucose, Bld: 108 mg/dL — ABNORMAL HIGH (ref 70–99)
Sodium: 142 mEq/L (ref 135–145)
Total Bilirubin: 0.7 mg/dL (ref 0.3–1.2)
Total Protein: 6.8 g/dL (ref 6.0–8.3)

## 2008-04-03 LAB — CBC WITH DIFFERENTIAL/PLATELET
BASO%: 0.3 % (ref 0.0–2.0)
EOS%: 0.5 % (ref 0.0–7.0)
LYMPH%: 15.2 % (ref 14.0–49.0)
MCHC: 33.3 g/dL (ref 32.0–36.0)
MONO#: 0.5 10*3/uL (ref 0.1–0.9)
Platelets: 197 10*3/uL (ref 140–400)
RBC: 4.4 10*6/uL (ref 4.20–5.82)
WBC: 9.3 10*3/uL (ref 4.0–10.3)
lymph#: 1.4 10*3/uL (ref 0.9–3.3)

## 2008-04-03 LAB — MORPHOLOGY: RBC Comments: NORMAL

## 2008-07-26 ENCOUNTER — Ambulatory Visit: Payer: Self-pay | Admitting: Oncology

## 2008-07-30 LAB — CBC WITH DIFFERENTIAL/PLATELET
Basophils Absolute: 0 10*3/uL (ref 0.0–0.1)
Eosinophils Absolute: 0.1 10*3/uL (ref 0.0–0.5)
HCT: 42.8 % (ref 38.4–49.9)
LYMPH%: 20.7 % (ref 14.0–49.0)
MCV: 86.1 fL (ref 79.3–98.0)
MONO%: 4.6 % (ref 0.0–14.0)
NEUT#: 5.2 10*3/uL (ref 1.5–6.5)
NEUT%: 73.5 % (ref 39.0–75.0)
Platelets: 156 10*3/uL (ref 140–400)
RBC: 4.96 10*6/uL (ref 4.20–5.82)

## 2008-07-30 LAB — COMPREHENSIVE METABOLIC PANEL
Alkaline Phosphatase: 46 U/L (ref 39–117)
BUN: 12 mg/dL (ref 6–23)
Creatinine, Ser: 1.19 mg/dL (ref 0.40–1.50)
Glucose, Bld: 143 mg/dL — ABNORMAL HIGH (ref 70–99)
Total Bilirubin: 1 mg/dL (ref 0.3–1.2)

## 2008-07-30 LAB — LACTATE DEHYDROGENASE: LDH: 166 U/L (ref 94–250)

## 2008-09-20 ENCOUNTER — Ambulatory Visit: Payer: Self-pay | Admitting: Oncology

## 2008-09-24 LAB — CBC WITH DIFFERENTIAL/PLATELET
Eosinophils Absolute: 0.1 10*3/uL (ref 0.0–0.5)
LYMPH%: 22.5 % (ref 14.0–49.0)
MCHC: 33.3 g/dL (ref 32.0–36.0)
MCV: 88.6 fL (ref 79.3–98.0)
MONO%: 7.5 % (ref 0.0–14.0)
NEUT#: 4.8 10*3/uL (ref 1.5–6.5)
Platelets: 168 10*3/uL (ref 140–400)
RBC: 4.8 10*6/uL (ref 4.20–5.82)

## 2008-09-24 LAB — MORPHOLOGY: RBC Comments: NORMAL

## 2008-11-22 ENCOUNTER — Ambulatory Visit (HOSPITAL_BASED_OUTPATIENT_CLINIC_OR_DEPARTMENT_OTHER): Admission: RE | Admit: 2008-11-22 | Discharge: 2008-11-22 | Payer: Self-pay | Admitting: General Surgery

## 2008-11-22 ENCOUNTER — Encounter (INDEPENDENT_AMBULATORY_CARE_PROVIDER_SITE_OTHER): Payer: Self-pay | Admitting: General Surgery

## 2008-12-19 ENCOUNTER — Ambulatory Visit: Payer: Self-pay | Admitting: Oncology

## 2008-12-21 LAB — CBC WITH DIFFERENTIAL/PLATELET
Eosinophils Absolute: 0.1 10*3/uL (ref 0.0–0.5)
HCT: 42.6 % (ref 38.4–49.9)
HGB: 14 g/dL (ref 13.0–17.1)
LYMPH%: 18.4 % (ref 14.0–49.0)
MONO#: 0.3 10*3/uL (ref 0.1–0.9)
NEUT#: 6.2 10*3/uL (ref 1.5–6.5)
NEUT%: 75.9 % — ABNORMAL HIGH (ref 39.0–75.0)
Platelets: 180 10*3/uL (ref 140–400)
WBC: 8.2 10*3/uL (ref 4.0–10.3)

## 2008-12-21 LAB — MORPHOLOGY: PLT EST: ADEQUATE

## 2008-12-21 LAB — COMPREHENSIVE METABOLIC PANEL
ALT: 17 U/L (ref 0–53)
AST: 20 U/L (ref 0–37)
Calcium: 9.1 mg/dL (ref 8.4–10.5)
Chloride: 105 mEq/L (ref 96–112)
Creatinine, Ser: 0.94 mg/dL (ref 0.40–1.50)
Potassium: 3.7 mEq/L (ref 3.5–5.3)

## 2009-01-21 ENCOUNTER — Ambulatory Visit: Payer: Self-pay | Admitting: Oncology

## 2009-01-21 LAB — CBC WITH DIFFERENTIAL/PLATELET
EOS%: 0.4 % (ref 0.0–7.0)
LYMPH%: 20.3 % (ref 14.0–49.0)
MCH: 30.7 pg (ref 27.2–33.4)
MCV: 90.4 fL (ref 79.3–98.0)
MONO%: 4.6 % (ref 0.0–14.0)
Platelets: 167 10*3/uL (ref 140–400)
RBC: 4.77 10*6/uL (ref 4.20–5.82)
RDW: 15.1 % — ABNORMAL HIGH (ref 11.0–14.6)

## 2009-01-21 LAB — MORPHOLOGY

## 2009-03-20 ENCOUNTER — Ambulatory Visit: Payer: Self-pay | Admitting: Oncology

## 2009-03-25 LAB — CBC WITH DIFFERENTIAL/PLATELET
BASO%: 0.2 % (ref 0.0–2.0)
LYMPH%: 22.2 % (ref 14.0–49.0)
MCHC: 34.2 g/dL (ref 32.0–36.0)
MONO#: 0.4 10*3/uL (ref 0.1–0.9)
NEUT#: 5.7 10*3/uL (ref 1.5–6.5)
Platelets: 163 10*3/uL (ref 140–400)
RBC: 4.93 10*6/uL (ref 4.20–5.82)
RDW: 14.8 % — ABNORMAL HIGH (ref 11.0–14.6)
WBC: 7.9 10*3/uL (ref 4.0–10.3)

## 2009-03-25 LAB — URIC ACID: Uric Acid, Serum: 6.8 mg/dL (ref 4.0–7.8)

## 2009-03-25 LAB — MORPHOLOGY: RBC Comments: NORMAL

## 2009-03-25 LAB — COMPREHENSIVE METABOLIC PANEL
AST: 27 U/L (ref 0–37)
Alkaline Phosphatase: 44 U/L (ref 39–117)
BUN: 13 mg/dL (ref 6–23)
Glucose, Bld: 119 mg/dL — ABNORMAL HIGH (ref 70–99)
Sodium: 141 mEq/L (ref 135–145)
Total Bilirubin: 1.1 mg/dL (ref 0.3–1.2)

## 2009-03-25 LAB — SEDIMENTATION RATE: Sed Rate: 7 mm/hr (ref 0–16)

## 2009-06-20 ENCOUNTER — Ambulatory Visit: Payer: Self-pay | Admitting: Oncology

## 2009-06-24 LAB — CBC WITH DIFFERENTIAL/PLATELET
Basophils Absolute: 0 10*3/uL (ref 0.0–0.1)
Eosinophils Absolute: 0.1 10*3/uL (ref 0.0–0.5)
HCT: 42.3 % (ref 38.4–49.9)
HGB: 14.6 g/dL (ref 13.0–17.1)
LYMPH%: 27.7 % (ref 14.0–49.0)
MCH: 30.9 pg (ref 27.2–33.4)
MCHC: 34.4 g/dL (ref 32.0–36.0)
MCV: 89.8 fL (ref 79.3–98.0)
NEUT#: 4 10*3/uL (ref 1.5–6.5)
NEUT%: 64.7 % (ref 39.0–75.0)
Platelets: 150 10*3/uL (ref 140–400)
RBC: 4.71 10*6/uL (ref 4.20–5.82)
lymph#: 1.7 10*3/uL (ref 0.9–3.3)

## 2009-06-24 LAB — COMPREHENSIVE METABOLIC PANEL
AST: 29 U/L (ref 0–37)
Albumin: 4.2 g/dL (ref 3.5–5.2)
BUN: 16 mg/dL (ref 6–23)
Calcium: 9.2 mg/dL (ref 8.4–10.5)
Chloride: 107 mEq/L (ref 96–112)
Sodium: 137 mEq/L (ref 135–145)
Total Protein: 6.8 g/dL (ref 6.0–8.3)

## 2009-06-24 LAB — MORPHOLOGY

## 2009-06-25 LAB — SEDIMENTATION RATE: Sed Rate: 2 mm/hr (ref 0–16)

## 2009-09-19 ENCOUNTER — Ambulatory Visit: Payer: Self-pay | Admitting: Oncology

## 2009-09-23 ENCOUNTER — Ambulatory Visit (HOSPITAL_COMMUNITY): Admission: RE | Admit: 2009-09-23 | Discharge: 2009-09-23 | Payer: Self-pay | Admitting: Oncology

## 2009-09-23 LAB — CBC WITH DIFFERENTIAL/PLATELET
Eosinophils Absolute: 0 10*3/uL (ref 0.0–0.5)
HGB: 15.9 g/dL (ref 13.0–17.1)
LYMPH%: 22.8 % (ref 14.0–49.0)
MCH: 31 pg (ref 27.2–33.4)
NEUT%: 71.3 % (ref 39.0–75.0)
RBC: 5.13 10*6/uL (ref 4.20–5.82)
lymph#: 1.7 10*3/uL (ref 0.9–3.3)

## 2009-09-23 LAB — COMPREHENSIVE METABOLIC PANEL
ALT: 26 U/L (ref 0–53)
AST: 30 U/L (ref 0–37)
CO2: 25 mEq/L (ref 19–32)
Creatinine, Ser: 1.25 mg/dL (ref 0.40–1.50)
Potassium: 3.8 mEq/L (ref 3.5–5.3)

## 2009-09-23 LAB — MORPHOLOGY

## 2009-09-24 LAB — SEDIMENTATION RATE: Sed Rate: 6 mm/hr (ref 0–16)

## 2009-12-17 ENCOUNTER — Ambulatory Visit: Payer: Self-pay | Admitting: Oncology

## 2009-12-19 LAB — CBC WITH DIFFERENTIAL/PLATELET
Basophils Absolute: 0 10*3/uL (ref 0.0–0.1)
Eosinophils Absolute: 0 10*3/uL (ref 0.0–0.5)
HGB: 16.9 g/dL (ref 13.0–17.1)
MONO#: 0.5 10*3/uL (ref 0.1–0.9)
NEUT#: 7.1 10*3/uL — ABNORMAL HIGH (ref 1.5–6.5)
RDW: 14.4 % (ref 11.0–14.6)
lymph#: 1.9 10*3/uL (ref 0.9–3.3)

## 2009-12-19 LAB — COMPREHENSIVE METABOLIC PANEL
AST: 36 U/L (ref 0–37)
Albumin: 4.2 g/dL (ref 3.5–5.2)
BUN: 17 mg/dL (ref 6–23)
CO2: 27 mEq/L (ref 19–32)
Calcium: 9.7 mg/dL (ref 8.4–10.5)
Chloride: 104 mEq/L (ref 96–112)
Glucose, Bld: 97 mg/dL (ref 70–99)
Potassium: 4.1 mEq/L (ref 3.5–5.3)

## 2009-12-19 LAB — SEDIMENTATION RATE: Sed Rate: 1 mm/hr (ref 0–16)

## 2010-02-23 ENCOUNTER — Encounter: Payer: Self-pay | Admitting: Oncology

## 2010-03-20 ENCOUNTER — Encounter (HOSPITAL_BASED_OUTPATIENT_CLINIC_OR_DEPARTMENT_OTHER): Payer: 59 | Admitting: Oncology

## 2010-03-20 ENCOUNTER — Other Ambulatory Visit: Payer: Self-pay | Admitting: Oncology

## 2010-03-20 DIAGNOSIS — C8379 Burkitt lymphoma, extranodal and solid organ sites: Secondary | ICD-10-CM

## 2010-03-20 LAB — CBC WITH DIFFERENTIAL/PLATELET
Basophils Absolute: 0.1 10*3/uL (ref 0.0–0.1)
EOS%: 0.5 % (ref 0.0–7.0)
Eosinophils Absolute: 0.1 10*3/uL (ref 0.0–0.5)
HCT: 49.1 % (ref 38.4–49.9)
HGB: 16.6 g/dL (ref 13.0–17.1)
LYMPH%: 19.8 % (ref 14.0–49.0)
MCHC: 33.7 g/dL (ref 32.0–36.0)
MONO#: 0.5 10*3/uL (ref 0.1–0.9)
RBC: 5.46 10*6/uL (ref 4.20–5.82)
WBC: 10.2 10*3/uL (ref 4.0–10.3)

## 2010-03-20 LAB — COMPREHENSIVE METABOLIC PANEL
AST: 33 U/L (ref 0–37)
Albumin: 4.1 g/dL (ref 3.5–5.2)
Alkaline Phosphatase: 36 U/L — ABNORMAL LOW (ref 39–117)
BUN: 13 mg/dL (ref 6–23)
Calcium: 9.7 mg/dL (ref 8.4–10.5)
Chloride: 104 mEq/L (ref 96–112)
Glucose, Bld: 77 mg/dL (ref 70–99)
Sodium: 140 mEq/L (ref 135–145)
Total Bilirubin: 1.6 mg/dL — ABNORMAL HIGH (ref 0.3–1.2)
Total Protein: 6.9 g/dL (ref 6.0–8.3)

## 2010-03-24 ENCOUNTER — Encounter (HOSPITAL_BASED_OUTPATIENT_CLINIC_OR_DEPARTMENT_OTHER): Payer: 59 | Admitting: Oncology

## 2010-03-24 DIAGNOSIS — C8379 Burkitt lymphoma, extranodal and solid organ sites: Secondary | ICD-10-CM

## 2010-03-25 ENCOUNTER — Encounter: Payer: Self-pay | Admitting: Vascular Surgery

## 2010-04-07 ENCOUNTER — Encounter: Payer: Self-pay | Admitting: Vascular Surgery

## 2010-05-08 LAB — CBC
MCV: 91.2 fL (ref 78.0–100.0)
RBC: 5.08 MIL/uL (ref 4.22–5.81)
WBC: 8.1 10*3/uL (ref 4.0–10.5)

## 2010-05-08 LAB — DIFFERENTIAL
Lymphs Abs: 1.9 10*3/uL (ref 0.7–4.0)
Monocytes Relative: 6 % (ref 3–12)
Neutro Abs: 5.7 10*3/uL (ref 1.7–7.7)
Neutrophils Relative %: 70 % (ref 43–77)

## 2010-05-08 LAB — BASIC METABOLIC PANEL
Calcium: 9.7 mg/dL (ref 8.4–10.5)
Chloride: 106 mEq/L (ref 96–112)
Creatinine, Ser: 1.45 mg/dL (ref 0.4–1.5)
GFR calc Af Amer: >60 mL/min (ref >60)
Sodium: 138 mEq/L (ref 135–145)

## 2010-05-12 ENCOUNTER — Encounter: Payer: Self-pay | Admitting: Vascular Surgery

## 2010-06-20 NOTE — Discharge Summary (Signed)
[name], BRITTAN                ACCOUNT NO.:  [id number]   MEDICAL RECORD NO.:  [id number]          PATIENT TYPE:  INP   LOCATION:  [place]:  [facility]   PHYSICIAN:  [name], M.D.DATE OF BIRTH:  [birth year]   DATE OF ADMISSION:  08/03/2005  DATE OF DISCHARGE:  08/13/2005                                 DISCHARGE SUMMARY   This is an elective admission for chemotherapy cycle #3 for this 50 year old  man recently diagnosed with  Burkitt's lymphoma.  Please see history and  physical for full details.  Complications of treatment to date have included  transient renal and hepatic failure, lactic acidosis, MRSA septicemia  with  the source being a neck abscess.  His initial induction regimen included  Cytoxan, Decadron ,  and vincristine.  followed 1 week later by high-dose  methotrexate, ara-C, etoposide with additional vincristine and Decadron.  Treatment program also includes intrathecal chemotherapy with methotrexate,  Ara-C and hydrocortisone.     He began his third cycle of treatment last week on July 27, 2005.  He is  admitted at this time for the completion of that cycle after a brief 72-hour  respite at home.   On initial exam, he is 6 feet 2 inches tall, 215 pounds, body surface area  2.35 m2, pulse 82 regular, blood pressure 148/84, respirations 28,  temperature 96.8.  Pertinent physical findings included total alopecia from  recent chemotherapy, healing abscess on the back of his neck.  Previous  cellulitis on the tip of his nose has resolved.  Clear lungs.  New Hickman  type catheter in the left subclavian position placed last week.  Previous  PICC catheter has been removed from the right antecubital fossa.  No focal  neurologic deficits.   HOSPITAL COURSE:  Initial chemotherapy for cycle three started last week,  included a second dose of intrathecal chemotherapy followed by parenteral  chemotherapy with Cytoxan 500 mg/m2 daily x2,  Adriamycin 50 mg/m2 on day  two, vincristine 2 mg IV day one, Decadron 10 mg p.o. b.i.d. times 5 days.  To complete the cycle he started during this admission, his second course of  high-dose methotrexate consisting of methotrexate 150 mg/m2, total dose 350  mg IV over 30 minutes followed by a continuous infusion the methotrexate  1.35 grams total dose, 3170 mg over 23.5 hours.  He received leucovorin  rescue 50 mg/m2 total dose 120 mg starting 36 hours after initiation of the  methotrexate.  He then received 15 mg/m2, total dose 35 mg IV q.6 h until  methotrexate levels were undetectable.   Overall he tolerated the treatment well.  He was given vigorous alkaline  hydration.  Urine pH was maintained above 7.  Electrolytes and renal  function monitored closely.  He did develop anticipated mucositis with pain  and erythema of his posterior pharynx but no ulceration.  He was given local  therapy plus pain medication to control this until it resolved.   CBC at time of admission on July 2 with hemoglobin 7.9, hematocrit 23, white  count 4700 with 98%  neutrophils and platelet count 347,000.  White blood  count fell as low as 0.5.  Platelets fell as low as 21,000.  Hemoglobin fell  as low as 7.1.  He required two blood transfusions during this admission but  no platelet transfusions.   Blood counts at the time of discharge on July 12 showed hemoglobin 8.8, 24  hours after a transfusion.  White blood count 12,000 with 88% neutrophils,  4% lymphocytes and platelet count 25,000.   Parenteral vancomycin was continued in view of recent MRSA sepsis and he has  now received 22 of a 28-day planned course of therapy.  The neck abscess  continued to heal despite transient fall in his white count.  I did give  Neupogen growth factor support during this cycle to accelerate white count  recovery.   On the day of admission, he complained of cramps in both of his calves which  had occurred over the  weekend while he was at home.  I initially thought  that these were nonspecific.  However, on hospital day #2 he complained of  persistent pain now localized just to the left calf.  A Doppler study was  obtained and unfortunately did show a deep venous thrombosis in his left  calf.  He was started on anticoagulation with Lovenox.  On the following  day, he began to complain of pain in his right elbow region radiating to his  medial proximal arm, site of previous PICC catheter.  Within 24 hours, there  was increased pain and swelling.  A Doppler study was done and showed  superficial phlebitis in the right arm.  Anticoagulation was continued.  Over the same interval, he developed diffuse erythema of his scalp which  looked to me like an acute cellulitis, again in a very unusual location.  This slowly resolved on its own.   On the second hospital day, he also complained of a rash in his inguinal  region.  He did develop a quite extensive rash in the right inguinal and  scrotal region which appeared to be a Candida infection.  He was put on a  course of Diflucan and completed 9 days at time of discharge.   On the third hospital day, he developed a low-grade nonproductive cough.  A  chest radiograph showed some bibasilar atelectasis and a small right pleural  effusion.  He was not febrile at that time and I opted for observation  alone.  When his white count fell to 0.5, I started an empiric course of  ciprofloxacin on the fourth hospital day.   His cough progressed.  He had a transient rise in his temperature which went  to 100.9 degrees on July10.  He attributed this to using a home digital  thermometer.  Subsequent temperatures were all normal.  He had decreased  breath sounds at his right lung base toward the end of the hospitalization.  I repeated another chest radiograph on July 11 and this showed no infiltrate or effusion.  I elected to start him on azithromycin in addition to the   vancomycin to cover any opportunistic bacterial infection, although my  clinical impression is that he has he had developed a viral bronchitis.   Platelet count got as low as 21,000.  His Lovenox was held for 1 day.  As  soon as platelet count appeared to be recovering, Lovenox was resumed and  will be continued as an outpatient.   Nutritional status was overall poor due to the recurrent  mucositis from the  chemotherapy.  Serum albumin 2.4 g%.   He developed mild hypokalemia with potassium as low as 3.1 which was  replaced both orally and parenterally.   Serum LDH which has been a very sensitive indicator of his disease was  normal on admission at 198 units and remained normal throughout the  admission with most recent value of 165 on August 06, 2005.  Liver functions  which have also been a sensitive indicator of progression of his disease  other than the albumin were normal at time of admission and other than a  transient elevation in bilirubin up to 1.6 remained normal with initial SGOT  19, SGPT 40, alkaline phosphatase 107, bilirubin 0.9.  Most recent chemistry  profile done on August 11, 2005 with BUN 3, creatinine 1, bilirubin down to 1,  SGOT 20, SGPT 33, alkaline phosphatase 79, albumin 2.4, total protein 5.4.   Both the left lower extremity calf tenderness and the right upper extremity  soft tissue pain and swelling had resolved by the time of discharge except  for some trace residual edema of the left lower extremity.   CONSULTATIONS:  None.   PROCEDURES.:  1. Ongoing chemotherapy for lymphoma.  2. Blood and platelet transfusion.   DIAGNOSES:  1. Burkitt lymphoma/leukemia.  2. Recent methicillin-resistant Staphylococcus aureus sepsis with source      being a neck abscess.  3. Mucositis, anticipated side effect of chemotherapy.  4. Pancytopenia, anticipated result of chemotherapy.  5. Acute left lower extremity deep venous thrombosis.  6. Acute superficial  thrombophlebitis right upper extremity.  7. Scalp cellulitis.  8. Candida dermatitis, right inguinal region.  9. Hypokalemia.  10.Likely viral upper respiratory tract infection.  11.Hypertension.  12.Chronic depression.   DISPOSITION:  Condition stable at time of discharge.  He will resumed  limited activity, regular diet.  Follow up in my office next week on  Tuesday, July 17.  Plan to readmit to the hospital on July 18 to begin cycle  number four of seven.  He will again get another intrathecal treatment prior  to the systemic chemotherapy.   MEDICATIONS:  1. To include Wellbutrin XL 300 mg daily.  2. Catapres TTS- 1 one patch weekly.  3. Peridex mouth wash q.i.d.  4. Vancomycin 2000 mg IV daily for 6 days.  5. Miconazole cream to the inguinal rash twice daily.  6. Lovenox 140 mg subcutaneous injection daily.  7. Tussionex twice daily for cough.  8. Z-Pak use as directed for 5 days.     Alyson Locket. Beryle Beams, [name]  Electronically Signed     JMG/MEDQ  D:  08/13/2005  T:  08/13/2005  Job:  [id number]   cc:   Tammy R. [name], [name]  Fax: [phone]

## 2010-06-20 NOTE — H&P (Signed)
NAMEKRISHIV, [name]                ACCOUNT NO.:  [id number]   MEDICAL RECORD NO.:  [id number]          PATIENT TYPE:  INP   LOCATION:  69                         FACILITY:  [facility]   PHYSICIAN:  [name], M.D.DATE OF BIRTH:  07-Sep-1960   DATE OF ADMISSION:  10/19/2005  DATE OF DISCHARGE:                                HISTORY & PHYSICAL   REASON FOR ADMISSION:  [age]-year-old gentleman with Burkitt  lymphoma/acute lymphocytic leukemia, now being admitted with increasing  weakness, fatigue, pancytopenia and dehydration.   HISTORY OF PRESENT ILLNESS:  The patient is a very pleasant [age] year old  gentleman from [place], who originally, in May 2007, presented with  pancytopenia.  He was finally diagnosed with Burkitt lymphoma/acute  lymphocytic leukemia.  He has been treated with intensive systemic  chemotherapy.  Most recently, he was admitted on October 05, 2005 for his  cycle #6 of chemotherapy consisting of intrathecal methotrexate with 50 mg,  with Ara-C 50 mg and hydrocortisone 50 mg.  On day #2 of his  hospitalization, he received methotrexate 150 mg/sq m.  Also included in his  chemotherapy was leucovorin rescue, ifosfamide and mesna, Ara-C, etoposide,  vincristine.  The patient has also been receiving Rituxan.  The patient was  discharged from the hospital on October 13, 2005.  On October 14, 2005,  he received Neulasta in the [facility].  Over  the past 24-48 hours, the patient states that he started having increasing  weakness, fatigue, barely able to move around in the house.  He has had very  minimal oral fluid intake.  He has noticed that his urine has become  increasingly concentrated with only small amounts of urine when he voids.  He, however, has not had any fevers, no chills, no nausea, no vomiting, no  diarrhea.  No headaches.  He did have 1 episode of a nosebleed at home prior  to admission.  However, after  pressure, the nosebleed did stop.  He is  therefore being admitted to Medical Oncology for transfusion, IV fluids and  supportive care.   PAST MEDICAL HISTORY:  Significant for pancytopenia, hypertension,  hyperlipidemia, depression, hemorrhoids.  The patient also has a history of  MRSA sepsis which has been treated with vancomycin, history of acute left  lower extremity DVT and superficial right upper extremity thrombophlebitis,  on chronic Lovenox, history of renal insufficiency and possibly related to  vancomycin.   ALLERGIES:  VANCOMYCIN.   SOCIAL HISTORY:  The patient is married.  He has no children.  He does not  smoke, does not drink.  He is a heavy Glass blower/designer and works for the  [employer]   FAMILY HISTORY:  Noncontributory.   CURRENT MEDICATIONS:  1. Catapres-TTS-3.  2. Lovenox 140 mg daily.  3. Prilosec 30 mg daily.  4. Toprol-XL 50 mg daily.  5. Wellbutrin 300 mg.  6. Cipro 500 mg twice a day.   PHYSICAL EXAMINATION:  GENERAL:  The patient is awake, pale, tired, fatigued-  appearing gentleman.  VITAL SIGNS:  Temperature 97.8, pulse 110, blood pressure 117/85, O2  SATs on  room air are 97% and respiratory rate is 20.  HEENT:  Sclerae are anicteric.  There is conjunctival pallor.  Oral mucosa  is dry.  No mucositis or thrush.  NECK:  Supple.  No palpable adenopathy.  LUNGS:  Clear bilaterally.  CARDIOVASCULAR:  Regular rate and rhythm with 2/6 systolic murmur and  tachycardiac.  ABDOMEN:  Soft, nontender and nondistended.  Bowel sounds are present.  No  HSM.  EXTREMITIES:  No edema.  Warm to touch.  Delayed capillary refill.  SKIN:  No rashes.  NEUROLOGIC:  The patient is alert, oriented, otherwise nonfocal.   LABORATORY DATA:  WBC 0.7, hemoglobin 7.1, hematocrit 19.9 and platelet  16,000, absolute neutrophil count 0.  Sodium 137, potassium 3.3, chloride  105, bicarb 22, glucose 143, BUN 27, creatinine 1.5, calcium 9.6.  AST 27,  ALT 50, alk phos  50, total bilirubin 2.2.   ASSESSMENT/PLAN:  [age]-year-old gentleman Burkitt lymphoma/acute  lymphocytic leukemia, status post cycle #6 of his planned chemotherapy given  between October 05, 2005 to October 13, 2005, now being admitted with  pancytopenia, weakness, fatigue and dehydration.  We will admit the patient  to the oncology floor under Dr. Azucena Freed service.  He will begin  intravenous fluids consisting of D5 normal saline at 125 mL an hour.  He  will be typed and crossed and transfused 2 units of packed red cells  tonight.  He will also receive 1 unit of platelets, as his platelet count is  only 16,000.  He will continue his Cipro 500 mg twice a day.  The patient  has been complaining of a sore throat and he will begin Mycelex Troches as  well as [employer].  All questions were answered.  The patient's  care will be taken over by Dr. Beryle Beams in the morning.      Marcy Panning, MD  Electronically Signed     ______________________________  Alyson Locket. Beryle Beams, [name]    KK/MEDQ  D:  10/19/2005  T:  10/19/2005  Job:  [id number]   cc:   Alyson Locket. Beryle Beams, [name]  Fax: [phone]

## 2010-06-20 NOTE — Discharge Summary (Signed)
NAMERAYYAN, MELLGREN                ACCOUNT NO.:  [id number]   MEDICAL RECORD NO.:  [id number]          PATIENT TYPE:  INP   LOCATION:  27                         FACILITY:  [facility]   PHYSICIAN:  [name], M.D.DATE OF BIRTH:  November 19, 1960   DATE OF ADMISSION:  10/05/2005  DATE OF DISCHARGE:  10/13/2005                                 DISCHARGE SUMMARY   This was an elective admission to continue chemotherapy for this 50 year old  man diagnosed with Burkitt-like lymphoma/leukemia in April of this year.  Please see history and physical for full details.   INITIAL EXAM:  VITAL SIGNS:  Afebrile 97.9, pulse 77 regular, respirations  20, blood pressure 138/90.  Total alopecia from previous chemotherapy.  LUNGS:  Clear.  CARDIAC:  No murmurs.  Hickman catheter right subclavian position.  ABDOMEN:  Soft, no masses, no organomegaly, no lymphadenopathy.  NEUROLOGIC:  No focal neurologic deficits.   BASELINE LAB DATA:  Hemoglobin 8.9, white count 15,000, ANC 14,000,  platelets 191,000, BUN 19, creatinine 1.   HOSPITAL COURSE:  Based on height 6 feet 1 inch, weight 200 pounds, body  surface area of 2 sq m, he began cycle 6 of 7 on his chemotherapy program.  Prior to initiating the intravenous chemotherapy, he received dose #5 of  intrathecal methotrexate 15 mg with ara-C 50 mg and hydrocortisone 50 mg  with the assistance of interventional radiology.  On the second hospital  day, he began his systemic chemotherapy with methotrexate 150 mg/sq m, total  dose 300 mg IV over 30 minutes followed by a continuous IV infusion 1.35 g  per sq m, total dose 2.7 g IV over 23.5 hours.  Leucovorin rescue given as  per previous cycles.  Additional chemo included ifosfamide 800 mg per sq m,  total dose 1600 mg mixed with equivalent amount of Mesna given IV over 1  hour days 1-5, ara-C 150 mg per sq m, total dose 300 mg by continuous IV  infusion on days 4 and day 5, etoposide 80 mg per sq m, total  dose 160 mg IV  over 1 hour days 4 and 5.  Vincristine 2 mg IV was given on August 27, near  time of recent hospital discharge.  Decadron 10 mg b.i.d. x5 days given  between August 26 and August 31.  Rituxan 375 mg per sq m, total dose 750 mg  to be given by IV infusion on day 6.   He was given vigorous alkaline hydration.  Methotrexate levels were followed  following leucovorin rescue until methotrexate level was less than 0.01.  Overall, he tolerated the program well.  On day of discharge, September 11,  he was starting to get low grade mucositis with erythema but no ulcers in  the posterior pharynx.  Blood counts were starting to fall already with a  hemoglobin down to 7.9, white count 1700, with 95% neutrophils, platelet  count 130,000, BUN and creatinine remained stable at 10 and 1.0,  respectively.   Additional data obtained at time of this admission included spinal fluid  analysis which showed clear fluid with  1 red cell, zero white cells, protein  41, and cytology negative for malignant cells.  A random glucose was 128 on  admission.  Subsequent values are within normal range and repeat value near  time of discharge on September 10, 87.  Liver functions with albumin 3.4,  SGOT 20 SGPT 21, alkaline phosphatase 41, bilirubin 0.8, LDH 258 on  October 06, 2005.  Repeat chemistry profile done on September 11, showed  normalization of the LDH down to 177, BUN 10, creatinine 1.0, liver  functions normal.   There were no complications.   CONSULTATIONS:  Interventional radiology.   PROCEDURES:  1. Lumbar puncture with administration of intrathecal chemotherapy.  2. Systemic chemotherapy.  3. Systemic immunotherapy with Rituxan.   DIAGNOSES:  1. Burkitt-like lymphoma/leukemia.  2. Essential hypertension.  3. History of methicillin-resistant Staphylococcus aureus sepsis from a      neck abscess occurring at time of initial chemotherapy course in May      2007.  4. Right lower  extremity deep venous thrombosis occurring following the      first chemotherapy in May 2007.  5. History of depression.  6. History of transient renal dysfunction, possibly related to prolonged      course of IV vancomycin for methicillin-resistant Staphylococcus aureus      sepsis.   DISPOSITION:  Condition stable for discharge.  He will resume limited  activity, regular diet, report to my office tomorrow to receive Neulasta  growth factor injection to support his white blood count.  Routine Hickman  catheter care will be provided by his nurse, who is an Therapist, sports.   MEDICATIONS TO INCLUDE:  1. Clonidine TTS - 1 patch once weekly.  2. Toprol XL 50 mg daily.  3. Wellbutrin XL 300 mg daily.  4. Prilosec OTC 1 daily.  5. Compazine 10 mg q.4 h p.r.n. nausea.  6. Lovenox 140 mg subcutaneous injection once daily.  7. Cipro 500 mg p.o. b.i.d. until white count recovers.      Alyson Locket. Beryle Beams, [name]  Electronically Signed     JMG/MEDQ  D:  10/13/2005  T:  10/13/2005  Job:  [id number]

## 2010-06-20 NOTE — H&P (Signed)
NAMEZACKREY, SCHLICHER                ACCOUNT NO.:  [id number]   MEDICAL RECORD NO.:  [id number]            PATIENT TYPE:   LOCATION:                                 FACILITY:   PHYSICIAN:  [name], M.D.DATE OF BIRTH:  Jul 29, 1960   DATE OF ADMISSION:  08/19/2005  DATE OF DISCHARGE:                                HISTORY & PHYSICAL   REASON FOR ADMISSION:  Burkitt lymphoma for next cycle of chemotherapy.   HISTORY OF PRESENT ILLNESS:  [name] is a 50 year old gentleman who was  diagnosed with Burkitt lymphoma/leukemia April 2007 at which time he  presented with migratory pain in his back and extremities as well as  weakness, fatigue, and fever.  He rapidly developed anemia and  thrombocytopenia.  He also had initial liver function abnormalities.  An  outpatient evaluation for an infectious process was unrevealing with a  normal hepatitis profile and negative HIV.  Initial Lyme disease titer was  elevated, and he was started on tetracycline.   Due to progressive pain and laboratory abnormalities, he was admitted on Jun 26, 2005, for further evaluation.  Review of the peripheral blood smear  showed greater than 30% blasts.  Bone marrow aspirate biopsy on Jun 29, 2005, was 100% cellular with over 80% blasts.  The blasts were typical of  those seen with Burkitt lymphoma/leukemia, although chromosome studies  failed to reveal the typical 814 translocation.  He had extremely aggressive  disease with a rapid rise in serum LDH up to 18,000, rapid deterioration in  his liver function with development of a profound hypoglycemia and lactic  acidosis.  Emergency treatment was required. Initial chemotherapy consisted  of a 5-day induction course with low-dose Cytoxan in addition to steroids  and vincristine.  He then received consolidation chemotherapy with a  combination of chemotherapy drugs including ifosfamide, high-dose  methotrexate, Ara-C, etoposide, additional vincristine,  and steroids.  He  also began a program of intrathecal chemotherapy with methotrexate, Ara-C,  and hydrocortisone.   He began the third cycle of chemotherapy on July 29, 2005, with cytoxan,  Adriamycin, vincristine, and Decadron.  He was readmitted to [facility] August 03, 2005, for cycle #3 with methotrexate, leucovorin rescue.   His course has been complicated by MRSA sepsis, source posterior neck  abscess; grade II mucositis; nasal soft tissue cellulitis; scalp cellulitis;  right inguinal candida infection; acute lower extremity DVT; superficial  right upper extremity thrombocystis; and removal of a PICC line due to MRSA.   [name] presents today for elective admission to proceed with the fourth of  an anticipated seven cycles of chemotherapy.   PAST MEDICAL HISTORY:  1.  Hypertension.  2.  Hyperlipidemia.  3.  Depression.  4.  Hemorrhoids.  5.  Status post Hickman catheter placement July 30, 2005.  6.  MRSA sepsis June 2007, source posterior neck abscess; status post      removal of a PICC line.  7.  Acute left lower extremity DVT and superficial right upper extremity      thrombophlebitis, now maintained  on Lovenox.   MEDICATIONS:  1.  Wellbutrin XL 300 mg daily.  2.  Catapres TTS patch weekly.  3.  Peridex mouthwash 4 times daily.  4.  Vancomycin 2000 mg IV daily.  5.  Miconazole cream to groin rash twice daily.  6.  Lovenox 140 mg daily.  7.  Tussionex 1 teaspoon twice daily for cough.   ALLERGIES:  No known drug allergies.   FAMILY HISTORY:  [name].   SOCIAL HISTORY:  [name] is married.  He lives in [place].  He does not  have children.  Prior to the lymphoma diagnosis, he worked as a English as a second language teacher for [employer].  No alcohol or tobacco use.   REVIEW OF SYSTEMS:  [name] reports that he feels well.  His appetite is  good.  No fever or chills.  He denies any bleeding.  No unusual headaches or  vision changes.  He denies any  pain.  No shortness of breath. Cough has  markedly improved. Denies any chest pain.  Left leg edema continues to  improve.  He has had no nausea or vomiting.  He denies any diarrhea.  No  abdominal pain.  Mouth discomfort has resolved.  No hematuria or dysuria.  He denies any bleeding.   PHYSICAL EXAMINATION:  VITAL SIGNS: Temperature 97.6, heart rate 99,  respirations 18, blood pressure 146/107.  Height 73 inches.  Weight 203.3  pounds.  GENERAL:  Pleasant Caucasian male in no acute distress.  HEENT:  Scalp alopecia.  Normocephalic and atraumatic.  Pupils equal, round,  and reactive to light.  Extraocular movements intact.  Sclerae anicteric.  Vessels normal.  Oropharynx is without thrush or ulcerations.  Mucous  membranes are pink and moist.  NECK:  Posterior neck abscess continue to heal.  LYMPH: No palpable cervical, supraclavicular, or axillary lymph nodes.  LUNGS: Clear bilaterally.  Breath sounds are diminished at the right base.  No wheezes or rales.  Hickman catheter site is nontender and without  erythema.  CARDIOVASCULAR:  Regular rate and rhythm.  No murmur.  ABDOMEN: Soft, nontender.  No mass, no organomegaly.  Areas of ecchymosis at  the right abdominal wall at a previous Lovenox injection site.  EXTREMITIES:  Trace edema of the left lower leg.  Calf soft and nontender.  NEUROLOGIC:  Alert and oriented.  Motor strength 5/5.  DTRs absent  symmetrically.  Finger-to-nose and rapid alternating movement intact.  Gait  is normal.   LABORATORY DATA:  Hemoglobin 10.8, white count 21, absolute neutrophil count  18.4, platelet count 235,000.  CMET, LDH, and uric acid pending.   IMPRESSION:  1.  Burkitt lymphoma/leukemia.  2.  Recent left lower extremity deep vein thrombosis and superficial right      upper lobe thrombophlebitis, now maintained on Lovenox. 3.  Methicillin-resistant Staphylococcus aureus sepsis, source posterior      neck abscess status post one-month course of  vancomycin.  4.  Hypertension.  5.  Depression.   PLAN:  [name] is being admitted to begin the fourth of an anticipated  seven cycles of chemotherapy.  Chemotherapy dosages will be based on a  height of 6 feet 2 inches, weight 215 pounds, and body surface area of 2.35  meters squared.  Chemotherapy will be given as follows.  Decadron 10 mg  twice daily for 5 days; vincristine 2 mg IV day #1; ifosfamide/ mesna (800  mg per meter2) 1880 mg IV over 1 hours day #1  through #5; methotrexate (150  mg per meter2) 350 mg IV day #1; methotrexate (1.35 g) 3178 mg by continuous  intravenous infusion over 23.5 hours beginning day #1; leucovorin (50 mg per  meter2) 120 mg 36 hours after initiation of the methotrexate (15 mg per  meter2) 35 mg IV every 6 hours until level is less than 0.01; Ara-C (150 mg  per meter2) 350 mg by continuous intravenous infusion days #4 and #5; and  etoposide 80 mg per meter2 (190 mg IV over 1 hour days #4 and #5).   He will also receive the third dose of intrathecal chemotherapy consisting  of methotrexate 15 mg, cytosine arabinoside 40 mg, and hydrocortisone 50 mg  per interventional radiology on August 19, 2005.   Patient interviewed and examined by Dr. Beryle Beams; plan reviewed.      Ned Card, N.P.      Alyson Locket. Beryle Beams, [name]  Electronically Signed    LT/MEDQ  D:  08/18/2005  T:  08/18/2005  Job:  [id number]   cc:   Tammy R. [name], [name]  Fax: [phone]

## 2010-06-20 NOTE — H&P (Signed)
[name], [name]                ACCOUNT NO.:  [id number]   MEDICAL RECORD NO.:  [id number]          PATIENT TYPE:  INP   LOCATION:  25                         FACILITY:  [facility]   PHYSICIAN:  [name], M.D.DATE OF BIRTH:  05-14-1960   DATE OF ADMISSION:  11/04/2005  DATE OF DISCHARGE:                                HISTORY & PHYSICAL   CHIEF COMPLAINT:  Mouth pain, nausea and weakness.   HISTORY OF PRESENT ILLNESS:  [name] is a 50 year old gentleman who  initially presented in April 2007 with pain in his back and extremities as  well as weakness, fatigue and fever.  He rapidly developed anemia and  thrombocytopenia and also had liver function abnormalities.  Outpatient  evaluation for an infectious process was unrevealing with a normal hepatitis  profile and negative HIV.  Initial Lyme titer was elevated and he was  started on a course of tetracycline.   Due to progressive pain and laboratory abnormalities, [name] was admitted  to the hospital Jun 26, 2005.  The peripheral blood smear showed greater  than 30% blasts.  Bone marrow aspirate/biopsy on Jun 29, 2005, was 100%  cellular with over 80% blasts.  The blasts were typical of those seen with  Burkitt's lymphoma/leukemia, although the chromosome studies failed to  reveal the typical of 814 translocation.  Serum LDH rapidly rose to 18,000  and he had rapid deterioration in his liver function with development of  profound hypoglycemia and lactic acidosis.  Emergency treatment was  initiated.  Initial chemotherapy consisted of a five day induction course  with low dose Cytoxan, steroids and vincristine.  This was followed by  consolidation chemotherapy with a combination of chemotherapy drugs  including ifosfamide, high dose methotrexate, Ara-C, etoposide, additional  vincristine and steroids.  He also began a program of intrathecal  chemotherapy with methotrexate, Ara-C and hydrocortisone.  He has completed  seven of a planned seven cycles of chemotherapy.  He began the seventh cycle  on October 29, 2005.  The chemotherapy consisted of vincristine, Cytoxan,  Adriamycin and methotrexate with leucovorin rescue.  He received Rituxan  November 02, 2005.  He was discharged home on November 03, 2005.   [name] presents to the office today for a Neulasta injection.  He reports  increased weakness, poor appetite, nausea, taste alteration, and persistent  mouth discomfort.  He has been unable to maintain adequate oral hydration.  He denies fever.  He occasionally feels chills.  He has had a frontal  headache since yesterday evening.  He denies any blurred or double vision.  No shortness of breath.  He he has had two small volume loose stools thus  far today.  No hematochezia or melena.  No abdominal pain.  No hematuria or  dysuria.  He has noted a decrease in his urine output.   PAST MEDICAL HISTORY:  1. Burkitt-like lymphoma versus adult ALL.  2. Recent coag negative Staph bacteremia currently on vancomycin.  3. History of MRSA sepsis.  4. History of left lower extremity DVT maintained on Lovenox.  5. History of vancomycin  related renal toxicity.  6. Hypertension.  7. Hyperlipidemia.  8. Depression.  9. Hemorrhoids   MEDICATIONS:  Wellbutrin SR 300 mg daily, Toprol XL 100 mg daily, Maxzide  37.5/25, 1 tablet daily, Prilosec 20 mg daily, Lovenox 140 mg subcu daily,  vancomycin twice daily, Compazine 10 mg every 4 hours as needed for nausea,  Marinol 5 mg twice daily, artificial tears as needed.   ALLERGIES:  History of VANCOMYCIN renal toxicity, resolved.   FAMILY HISTORY:  Noncontributory.   SOCIAL HISTORY:  [name] is married.  He lives in [place].  He does not  have children.  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  No alcohol or tobacco use.   REVIEW OF SYSTEMS:  Per HPI.   PHYSICAL EXAMINATION:  VITAL SIGNS:  Temperature  98, heart rate 83, respirations 16, blood pressure  139/82.  GENERAL:  Caucasian male in no acute distress.  HEENT:  Alopecia.  Pupils equal, round, and reactive to light.  Extraocular  movements intact.  Sclerae anicteric.  Oropharynx is without thrush.  Posterior palate is erythematous.  CHEST/LUNGS:  Clear bilaterally.  Breath sounds diminished at the right  base.  Hickman catheter site is nontender without erythema.  CARDIOVASCULAR:  Regular rate and rhythm.  No murmur.  ABDOMEN:  Soft.  Slightly tender at the right lower quadrant.  No guarding,  rebound or rigidity.  No organomegaly.  EXTREMITIES:  No cyanosis, clubbing or edema.  NEUROLOGICAL:  Alert and oriented.  Motor strength 5/5.   LABORATORY DATA:  Hemoglobin 10.9, white count 11, absolute neutrophil count  10.8, platelet count 156,000.   IMPRESSION:  1. Burkitt like lymphoma versus adult ALL status post cycle seven      chemotherapy day six.  2. Mucositis with inability to maintain adequate hydration orally.  3. Progressive weakness.  4. Recent coag negative Staph bacteremia day 13 vancomycin.  5. History of left lower extremity DVT maintained on Lovenox.  6. History of MRSA sepsis.  7. Hypertension.  8. History of vancomycin related renal toxicity.   PLAN:  1. Intravenous hydration.  2. Pain medication.  3. Monitor counts through NADIR.   The patient interviewed and examined by Dr. Beryle Beams; plan reviewed.      Ned Card, N.P.      Alyson Locket. Beryle Beams, [name]  Electronically Signed    LT/MEDQ  D:  11/04/2005  T:  11/05/2005  Job:  [id number]   cc:   Tammy R. [name], [name]  Fax: [phone]

## 2010-06-20 NOTE — Discharge Summary (Signed)
NAMELYNDALL, COUNTERMAN                ACCOUNT NO.:  [id number]   MEDICAL RECORD NO.:  [id number]          PATIENT TYPE:  INP   LOCATION:  71                         [facility]ACILITY:  [facility]   PHYSICIAN:  [name], M.D.DATE OF BIRTH:  1960/03/07   DATE OF ADMISSION:  10/19/2005  DATE OF DISCHARGE:  10/25/2005                                 DISCHARGE SUMMARY   REASON FOR HOSPITALIZATION:  Pancytopenia, weakness, fatigue and  dehydration.   HISTORY OF PRESENT ILLNESS:  Mr. Paley is a 50 year old gentleman who  initially presented April 2007 with back and extremity pain as well as  weakness, fatigue and fever.  He rapidly developed anemia and  thrombocytopenia and also had initial liver function abnormalities.  Outpatient evaluation for an infectious process was unrevealing with a  normal hepatitis profile and negative HIV.  Initial Lyme disease titer was  elevated, and he started on a course of tetracycline.   He was admitted to the hospital Jun 26, 2005 due to progressive pain and  laboratory abnormalities.  Review of the peripheral blood smear showed  greater than 30% blasts.  Bone marrow aspirate/biopsy, Jun 29, 2005, was  100% cellular with over 80% blasts.  The blasts were typical of those seen  with Burkitt lymphoma/leukemia, although chromosome studies failed to reveal  the typical 814 translocation.  The serum LDH rapidly increased, and he had  a rapid deterioration in his liver function with development of profound  hypoglycemia and lactic acidosis.   Emergency treatment was initiated.  Initial chemotherapy consisted of a 5-  day induction course with low-dose Cytoxan, steroids and vincristine.  He  then received consolidation chemotherapy with a combination of chemotherapy  drugs, including ifosfamide, high-dose methotrexate, Ara-C, etoposide,  additional vincristine and steroids.  He also began a program of intrathecal  chemotherapy with methotrexate, Ara-C and  hydrocortisone.  At the time of  this admission, Mr. Pursley had completed six of a planned seven cycles of  chemotherapy.  He received intrathecal methotrexate, Ara-C and  hydrocortisone on 10/06/2005 and began the systemic chemotherapy on  10/07/2005.   He was discharged home on 10/13/2005.  On 10/14/2005, he received a Neulasta  injection.  On 10/19/2005 Mr. Qualley called to report a 24-48 hour history of  increasing weakness, fatigue and poor oral fluid intake.  He was  subsequently admitted to the Oncology Unit at [facility] for  further evaluation.   Admission vital signs showed temperature 97.8, heart rate 110, blood  pressure 117/85, respiratory rate 20.  Oxygen saturation 97% on room air.   ADMISSION PHYSICAL EXAMINATION:  Per Dr. Humphrey Rolls:  HEENT:  Sclerae anicteric.  Conjunctival pallor.  Oral mucosa was dry.  There was no mucositis or thrush.  NECK:  Supple.  No palpable adenopathy.  LUNGS:  Clear bilaterally.  CARDIOVASCULAR:  Regular rate and rhythm with a 2/6 systolic murmur and  tachycardia.  ABDOMEN:  Soft, nontender and nondistended.  Bowel sounds  present.  No organomegaly.  EXTREMITIES:  No edema.  Warm to touch.  There  was delayed capillary refill.  SKIN:  No rashes.  NEUROLOGIC:  The patient was alert, oriented and otherwise nonfocal.   HOSPITAL COURSE:  Mr. Imel was admitted to the Oncology Unit at [facility] 10/19/2005 for further evaluation of weakness and fatigue.  Admission lab work showed hemoglobin 7.1, white count 0.7, absolute  neutrophil count zero, platelet count 16,000, sodium 137, potassium 3.3,  chloride 105, bicarb 22, glucose 143, BUN 27, creatinine 1.5, calcium 9.6,  SGOT 27, SGPT 50, alkaline phosphatase 50, total bilirubin 2.2.   Intravenous hydration was initiated with D5 normal saline at 125 cc per  hour.  He was transfused with 2 units of packed red blood cells upon  admission for hemoglobin of 7.1.  Following the blood  transfusion, he  received a platelet transfusion due to a platelet count of 16,000.  Hemoglobin on 10/20/2005 returned at 6.  He was subsequently transfused an  additional 2 units of packed red blood cells.  Hemoglobin the following day  had increased to 8.4.  The platelet count returned at 25,000 on 10/20/2005.   Evidence of recovery of the white blood cell count was initially noted on  10/21/2005 when it returned at 1.1.  The total white count at discharge  (10/25/2005) was 17.4 with an absolute neutrophil count of 15.5.   On 10/20/2005, Mr. Pertile reported severe low back pain.  Dr. Beryle Beams was  concerned for a possible retroperitoneal/intra-abdominal hemorrhage.  Platelet count earlier that day had returned at 25,000.  He was subsequently  transfused a unit of platelets.  Stat abdominal CT and ultrasound showed no  retroperitoneal or intra-abdominal bleed.  His back pain resolved and did  not recur.   On 10/21/2005, Mr. Belman was noted to have a temperature 100.7.  At that  time, he was on oral prophylactic Cipro.  Two sets of blood cultures were  obtained.  The oral Cipro was discontinued.  Fortaz 2 grams IV every 8 hours  was initiated.  A portable chest x-ray showed minimal bronchitic changes.  There was a question of minimal bibasilar atelectasis.  There was no  definite infiltrate.  On 10/22/2005, both sets of blood cultures returned  with gram positive cocci in clusters.  Intravenous vancomycin was initiated.  The final blood culture report returned with a coag-negative staph in both  sets of blood cultures.  The vancomycin was continued as well as clindamycin  and erythromycin.  Mr. Pichon remained afebrile for greater than 72 hours  prior to discharge.  Arrangements were made for continuation of vancomycin  as an outpatient.   On 10/23/2005, the fifth dose of Rituxan was given.  Dosages based on a height of 6 feet 1 inch, weight 191 pounds, and body surface area 2.0 meters   squared.  He was premedicated with Tylenol 650 mg, Benadryl 50 mg, and  lorazepam 2 mg.  He received Rituxan (375 mg per meter squared) 750 mg IV.  He tolerated the infusion well.   On 10/25/2005, Mr. Tollis was felt to be stable for discharge home.  CBC that  day showed a hemoglobin of 9.5, platelet count 28,000, white count 17.4 an  absolute neutrophil count 15.5.  He was afebrile.  Arrangements were made  for continuation of the vancomycin as an outpatient.   CONSULTATIONS:  None.   PROCEDURES:  1 .  Rituxan immunotherapy, 10/23/2005.  1. Blood and platelet transfusions.  2. IV antibiotics.   DISCHARGE DIAGNOSES:  1. Burkitt-like lymphoma versus ALL, day 18, cycle 6 chemotherapy.  2.  Coag negative staph sepsis, to be discharged home on IV vancomycin.  3. Back pain secondary to DJD and spasm, resolved.  4. Pancytopenia, secondary to chemotherapy - improved.  5. Left lower left lower extremity DVT, maintained on Lovenox.  6. Hypertension.  7. History of the MRSA sepsis.  8. History of transient renal failure, likely related to vancomycin,      resolved.   LABORATORY DATA:  10/25/2005:  Hemoglobin 9.5, white count 17.4, absolute  neutrophil count 15.5, platelet count 28,000, sodium 144, potassium 3.2,  chloride 109, CO2 26, glucose 93, BUN 3, creatinine 0.9, calcium 8.8.   DISCHARGE DISPOSITION:  1. Condition:  Stable for discharge home with close outpatient follow-up.  2. Diet:  No restrictions.  3. Wound care:  Routine care of Hickman catheter.  4. Follow-up:  Office will call with appointment time.   DISCHARGE MEDICATIONS:  1. Wellbutrin XL 300 mg daily.  2. Lovenox 140 mg subcutaneously daily.  3. Toprol XL 50 mg daily.  4. Clonidine TTS patch every 7 days.  5. Compazine 10 mg every 4 hours as needed for nausea.  6. IV vancomycin per [place].      Ned Card, N.P.      Alyson Locket. Beryle Beams, [name]  Electronically Signed    LT/MEDQ  D:   11/01/2005  T:  11/02/2005  Job:  [id number]   cc:   Tammy R. [name], [name]  Fax: [phone]   Donato Heinz, MD  [facility]

## 2010-06-20 NOTE — Discharge Summary (Signed)
NAMECALVION, DELEE                ACCOUNT NO.:  [id number]   MEDICAL RECORD NO.:  [id number]          PATIENT TYPE:  INP   LOCATION:  59                         FACILITY:  [facility]   PHYSICIAN:  [name], M.D.DATE OF BIRTH:  1960/10/21   DATE OF ADMISSION:  11/04/2005  DATE OF DISCHARGE:  11/12/2005                                 DISCHARGE SUMMARY   REASON FOR ADMISSION:  Mouth pain, nausea and weakness.   HISTORY OF PRESENT ILLNESS:  Mr. Carnevale is 50 year old gentleman who initially  presented in April2007 with migratory pain in the back and extremities as  well as weakness, fatigue and fever.  He rapidly developed anemia and  thrombocytopenia and also had liver function abnormalities.  Outpatient  evaluation for an infectious process was unrevealing with a normal hepatitis  profile and negative HIV.  Initial Lyme titer was elevated and he was  started on a course of tetracycline.   Due to progressive pain and laboratory abnormalities, Mr. Ehresmann was admitted  to [id number].  Peripheral blood smear showed greater than 30% blasts.  Bone  marrow aspirate/biopsy on [id number] was 100% cellular with over 80% blasts.  The blasts were typical of those seen with Burkitt's lymphoma/leukemia  although the chromosome studies failed to reveal the typical of 8;14  translocation.  His serum LDH rapidly rose to 18,000 and he had significant  deterioration in liver function with development of profound hypoglycemia  and lactic acidosis.  Emergency treatment was initiated.  Initial  chemotherapy consisted of a 5-day induction course with low-dose Cytoxan,  steroids and vincristine.  This was followed by consolidation chemotherapy  with a combination of chemotherapy drugs including ifosfamide, high-dose  methotrexate, Ara-C, etoposide, additional vincristine and steroids.  He  also began a program of intrathecal chemotherapy with methotrexate, Ara-C  and hydrocortisone.  At the time of  this admission, he had completed seven  of a planned seven cycles of chemotherapy.  He began the seventh and final  cycle on September27,2007 and was discharged from the hospital on November 03, 2005.   Mr. Kolk presented to the office on November 04, 2005 for a Neulasta  injection.  At that time, he reported increased weakness, anorexia, nausea,  alteration in taste and persistent mouth discomfort.  He was unable to  maintain adequate oral hydration.  He was admitted for further evaluation.   PHYSICAL EXAMINATION:  VITAL SIGNS:  Admission vital signs showed a  temperature of 98, heart rate 83, respirations 16, blood pressure 139/82.  GENERAL:  Admission physical examination showed a Caucasian male in no acute  distress.  HEENT:  Alopecia.  Pupils equal, round and reactive to light.  Extraocular  movements intact.  Sclerae anicteric.  Oropharynx was without thrush.  Posterior palate was erythematous.  CHEST/LUNGS:  Clear bilaterally.  Breath sounds diminished at the right  base.  Hickman catheter site was nontender and without erythema.  CARDIOVASCULAR:  Regular rate and rhythm.  No murmur.  ABDOMEN:  Soft.  He was slightly tender at the right lower quadrant.  There  was no guarding, rebound or  rigidity.  No organomegaly.  EXTREMITIES:  No cyanosis, clubbing or edema.  NEURO:  Alert and oriented.  Motor strength 5/5.   HOSPITAL COURSE:  Mr. Hazell was admitted to the oncology unit at [facility] on November 04, 2005.  Admission lab work showed hemoglobin 10.9,  white count 11, absolute neutrophil count 10.8, platelet count 156,000,  sodium 141, potassium 3.8, chloride 106, CO2 26, glucose 223, BUN 27,  creatinine 1.1, bilirubin 1.7, alkaline phosphatase 59, SGOT 21, SGPT 79,  total protein 5.6, albumin 3.3, calcium 9.4, LDH 193.  Intravenous hydration  was initiated.  Local/supportive care was given for the mucositis.  On  November 05, 2005, Mr. Knoedler developed diarrhea.  This was of  felt to be  secondary to mucositis related to the chemotherapy.  The diarrhea improved  over the next 24 hours and had completely resolved at discharge.   Lab work on November 06, 2005 showed blood count falling as expected.  The  total white count was 400 with an absolute neutrophil count of 200.  The  platelet count was 79,000.  Prophylactic Cipro was added.  Mr. Estime was  already on vancomycin (day 68) for a recent coag-negative staph bacteremia.  Evidence of recovery of the white cell count was noted on November 09, 2005  when it returned at 3.3 with an absolute neutrophil count of 2.8.  The  platelet count declined to a low of 27,000 on November 09, 2005.  On November 12, 2005, the platelet count returned at 48,000.  Transfusion support was  provided in the form of packed red blood cells on November 08, 2005 for a  hemoglobin of 7.5.  Hemoglobin on November 09, 2005 returned at 10.1.   On November 08, 2005, Mr. Buhrman spiked a temperature to 102.3 degrees.  The  temperature spike occurred before the blood transfusion.  The oral Cipro was  changed to IV, vancomycin was continued and oral Diflucan was added.  Blood  cultures, a urine culture and chest x-ray were obtained.  The blood cultures  and urine culture remained sterile.  The chest x-ray was negative.  He had  no further temperature elevations throughout the remainder of the  hospitalization.   On November 10, 2005, the vancomycin was discontinued and oral clindamycin 600  mg twice daily was started for an anticipated additional 9 days of  antibiotics to complete a 28-day course.  November 11, 2005,  he developed  nausea with vomiting and a headache.  The discharge was held.  He was having  some difficulty tolerating the clindamycin.  It was subsequently  discontinued and vancomycin resumed.  He remained in the hospital overnight  for monitoring.  The nausea/vomiting and headache all resolved.  On November 12, 2005, the vancomycin was again  discontinued.  He will be given a dose of  clindamycin oral solution (75 mg per 5 mL) 20 mL to assess tolerance prior  to being discharged home.   On she November 10, 2005, Mr. Bozman's creatinine returned at 1.4.  On November 12, 2005, the creatinine was 1.8.  Dr. Beryle Beams was concerned for  possible vancomycin related renal toxicity as this had occurred in the past  when Mr. Kyger was on a prolonged course of vancomycin.  As noted above, the  vancomycin was discontinued on November 12, 2005 and clindamycin resumed.  We  will repeat his renal function as an outpatient November 12, 2005.   Mr. Koepp has a history  of a left lower extremity DVT.  He has been  maintained on Lovenox throughout his treatment due to difficulty in managing  Coumadin in his situation.  With completion of the final course of  chemotherapy, Coumadin was initiated November 10, 2005.  The Lovenox will be  continued until Coumadin is therapeutic.  Anticoagulation will be continued  through [place].   Mr. Lazzara Hickman catheter will be removed prior to his discharge November 12, 2005.   The mucositis resolved with recovery of the blood counts.  Mr. Grosjean  continued to experience anorexia.  He received Marinol throughout the  hospitalization.  Some improvement in his appetite was noted just prior to  discharge.   LABORATORY DATA:  November 12, 2005 hemoglobin 9.8, white count 24, absolute  neutrophil count 22, platelet count 48,000, sodium 142, potassium 3.5, BUN  5, creatinine 1.8, glucose 93, calcium 9.5.   PROCEDURES:  1. Blood transfusion November 08, 2005.  2. IV antibiotics.   CONSULTATIONS:  Interventional radiology for removal of Hickman catheter.   DISCHARGE DIAGNOSES:  1. Burkitt-like lymphoma versus adult ALL status post cycle seven of      chemotherapy.  Prophylactic cranial radiation is planned as an      outpatient.  2. Pancytopenia secondary to chemotherapy - white count is recovering.  3.  Recurrent MRSA sepsis status post approximate 3-week course of      vancomycin with an additional 6 days of oral clindamycin planned as an      outpatient.  4. Mucositis/diarrhea secondary to chemotherapy - resolved.  5. Elevated creatinine - question vancomycin related renal toxicity.  6. Nausea/vomiting/headache - resolved.  7. History of left lower extremity DVT on Lovenox/Coumadin.  8. Hypertension   DISCHARGE DISPOSITION:  1. Condition - stable for discharge home.  2. Diet - no restrictions.  3. Activity - increase activity slowly.  4. Follow-up:  5. Dr. Beryle Beams - call office to schedule.  6. Lab work November 12, 2005.   DISCHARGE MEDICATIONS:  1. Wellbutrin XL 300 mg daily.  2. Toprol XL 100 mg daily.  3. Maxzide 37.5/25 1 tablet daily.  4. Coumadin 5 mg daily.  5. Lovenox 140 mg daily until Coumadin level is therapeutic.  6. Clindamycin liquid (75 mg per 5 mL) 20 mL 4 times a day for 6 days.      Ned Card, N.P.     Alyson Locket. Beryle Beams, [name]  Electronically Signed    LT/MEDQ  D:  11/12/2005  T:  11/13/2005  Job:  [id number]   cc:   Tammy R. [name], [name]  Fax: [phone]

## 2010-06-20 NOTE — Discharge Summary (Signed)
NAMEZAMON, NOVEMBER                ACCOUNT NO.:  [id number]   MEDICAL RECORD NO.:  [id number]          PATIENT TYPE:  INP   LOCATION:  [place]                         FACILITY:  [facility]   PHYSICIAN:  [name], M.D.DATE OF BIRTH:  09-28-1960   DATE OF ADMISSION:  08/19/2005  DATE OF DISCHARGE:  09/10/2005                                 DISCHARGE SUMMARY   REASON FOR HOSPITALIZATION:  Burkitt's lymphoma for chemotherapy.   HISTORY OF PRESENT ILLNESS:  Mr. Lapuma is a 50 year old gentleman who was  diagnosed with Burkitt's lymphoma/leukemia April2007.  He presented at that  time with migratory pain in his back and extremities as well as weakness,  fatigue and fever.  He rapidly developed anemia and thrombocytopenia.  He  also had initial liver function abnormalities.  Outpatient evaluation for  infectious process was unrevealing with a normal hepatitis profile and  negative HIV.   Due to progressive pain and laboratory abnormalities, he was admitted to the  hospital on May25,2007, for further evaluation.  Review of the peripheral  blood smear showed greater than 30% blasts.  Bone marrow aspirate and biopsy  on May28,2007, was 100% cellular with over 80% blasts.  The blasts were  typical of those seen with Burkitt lymphoma/leukemia though the chromosome  studies failed to reveal the typical 8;14 translocation.  He had extremely  aggressive disease with a rapid rise in the serum LDH to 18,000, rapid  deterioration in his liver function with development of a profound  hypoglycemia and lactic acidosis.  Emergency treatment was required.  Initial chemotherapy consisted of a 5-day induction course with low-dose  Cytoxan in addition to steroids and vincristine.  He then received  consolidation chemotherapy with a combination of  ifosfamide, high-dose  methotrexate, Ara-C, etoposide, additional vincristine and steroids.  He was  also started on a program of intrathecal chemotherapy with  methotrexate, Ara-  C and hydrocortisone.  At the time of this admission, he had completed three  cycles of systemic chemotherapy and two doses of intrathecal chemotherapy.   His course has been complicated by MRSA sepsis, source posterior neck  abscess; grade II mucositis; nasal soft tissue cellulitis; scalp cellulitis;  right inguinal Candida infection; acute left lower extremity DVT;  superficial right upper extremity thrombophlebitis.  A PICC line was removed  due to the MRSA sepsis.   Mr. Angst presented on August 19, 2005, for elective admission to proceed with  the fourth of an anticipated seven cycles of chemotherapy.   ADMISSION VITAL SIGNS:  A temperature of 97.6, heart rate 99, respirations  18, blood pressure 146/107, height 73 inches, weight 203.3 pounds.   ADMISSION PHYSICAL EXAMINATION:  GENERAL APPEARANCE:  A pleasant Caucasian  male in no acute distress.  HEENT:  Scalp alopecia.  Pupils equal, round and reactive to light.  Extraocular movements intact.  Sclerae anicteric.  Vessels normal.  Oropharynx was without thrush or ulceration.  Mucous membranes were pink and  moist.  NECK:  Posterior neck abscess continued to show signs of healing.  LYMPHS:  No palpable cervical, supraclavicular or axillary lymph nodes.  CHEST:  Lungs were clear bilaterally.  Breath sounds were diminished at the  right base.  There were no wheezes or rales.  Hickman catheter site was  unremarkable.  CARDIOVASCULAR:  Regular rate and rhythm.  No murmur.  ABDOMEN:  Soft, nontender.  No mass.  No organomegaly.  There was several  areas of ecchymosis at the right abdominal wall at previous Lovenox  injection site.  EXTREMITIES:  Trace edema of the left lower leg.  Calves were soft and  nontender.  NEUROLOGIC:  Alert and oriented.  Motor strength 5/5.  DTRs absent  symmetrically.  Gait was normal.  Finger-to-nose and rapid alternating  movement intact.   HOSPITAL COURSE:  Mr. Schubbe was admitted to  the oncology unit at [facility] on August 19, 2005, to proceed with chemotherapy.  Lab work obtained  the day prior showed hemoglobin 10.8, white count 21, absolute neutrophil  count 18.4, platelet count 235,000. Sodium 142, potassium 3, chloride 104,  CO2 24, glucose 102, BUN 8, creatinine 1.85, bilirubin 0.6, alkaline  phosphatase 86, SGOT 15, SGPT 18, total protein 6.7, albumin 3.8, calcium  8.8.  Lab work was repeated on August 19, 2005, due to the unexpected change  in renal function the day prior.  Repeat lab work on August 19, 2005, showed  creatinine 2.3, BUN 8, chloride 108, CO2 26, potassium 3.2.  Dr. Beryle Beams  felt that the renal dysfunction was most likely drug induced, possibly  secondary to the recent 4-week course of vancomycin.  There was no evidence  of a developing acidosis.  Lab work was repeated on [phone], with the  creatinine returning at 2.2, BUN 14. ultrasound showed no evidence for  obstruction.  Over the next several days, his renal function improved with  subsequent normalization.  Chemotherapy doses were adjusted for the renal  dysfunction and methotrexate was held until renal function improved.   On the day of admission, August 19, 2005, the third cycle of intrathecal  chemotherapy was administered via lumbar puncture per interventional  radiology.  Mr. Finucane was premedicated with Decadron 10 mg IV, Zofran 16 mg  IV and Ativan 2 mg IV.  He did received methotrexate 15 mg, cytosine  arabinoside 40 mg and hydrocortisone 50 mg mixed together in 10 mL total  volume of preservative free saline via the intrathecal route.  Cerebral  spinal fluid studies showed a total protein 33, white blood cell count 1,  red blood cell count 6.  The cytology on the cerebrospinal fluid showed a  few lymphocytes.  Evidence of a high-grade lymphoproliferative process was  not identified.  The systemic chemotherapy regimen was altered due to the renal dysfunction  noted on  admission.  The methotrexate was held until the end of the cycle.  Beginning on August 20, 2005, Mr. Pitre received ifosfamide 1410 mg mixed with  mesna 1410 mg IV over 1 hour daily days one through five; vincristine 2 mg  IV push day one only; cytosine arabinoside 350 mg by continuous IV infusion  over 24 hours days four and five and etoposide 143 mg IV over 1 hour days  four and five.   Dr. Beryle Beams elected to proceed with the methotrexate portion of this  cycle as Mr. Labrake's renal function improved.  Beginning on August 24, 2005,  intravenous hydration was initiated with D5W with 2 ampules of sodium bicarb  per liter 150 cc/hour.  Sodium bicarbonate tablets were also started three  times daily.  Adequate  urine pH and urine output were obtained prior to  beginning the methotrexate.  On August 25, 2005, Mr. Garr received  methotrexate (150 mg/m2) 330 mg IV over 30 minutes followed by methotrexate  (1.3 g/m2) 2970 mg IV over 23.5 hours.  Of note, chemotherapy doses were  based on a height of 6 feet 2 inches, weight 211 pounds and body surface  area 2.2 m2.  Leucovorin rescue (50 mg/m2) 110 mg IV was given 36 hours  after initiation of the first dose of methotrexate and again repeated in 6  hours.  He then received leucovorin 35 mg IV every 6 hours.  Methotrexate  levels were followed closely.  The methotrexate level on August 27, 2005,  returned at 1.47, August 28, 2005, returned at 0.26, August 29, 2005, 0.14, August 30, 2005, 0.03 and on August 31, 2005, the methotrexate level returned at  0.01.  The leucovorin was discontinued on August 31, 2005.  Adequate urine  output and urine pH were maintained throughout the hospitalization.  Mr.  Ochsner's renal function remained stable/normalized following the methotrexate.   His blood counts fell as expected following the chemotherapy.  The platelet  count declined to a low of 14,000 on September 03, 2005.  He did not require a  platelet transfusion during this  hospitalization.  The total white count  fell to a low of 1.1 on September 03, 2005.  Transfusion support in the form of  packed red blood cells was provided on September 01, 2005, for a hemoglobin of  7.5 and on August 26, 2005, for hemoglobin of 6.6.  Evidence of recovery of  the blood counts was noted around August04,2007.  At the time of discharge  the platelet count had normalized at a level of 362,000; the total white  count was 1.8 with an absolute neutrophil count of 0.4.  Hemoglobin at  discharge was 10.5.   Mr. Nissim developed a low grade fever of 100 on September 07, 2005.  His  temperature never increased above this level.  He was afebrile throughout  the remainder of the hospitalization.   Following the chemotherapy Mr. Shaak developed grade I-II mucositis.  Supportive care was provided with mouth rinses including Magic mouth wash,  viscous lidocaine and Peridex mouth rinse.  He developed oral candidiasis treated with Diflucan and Nystatin.  He maintained adequate oral intake  despite the mucositis.  The mucositis had resolved by discharge.   On August 25, 2005, Mr. Arnhold reported a headache and slightly blurred vision.  Blood pressure at that time was 153/98.  At that time, he was on a clonidine  TTS-1 mg patch.  Toprol XL 50 mg daily was initiated.  His blood pressure  improved/normalized.  His blood pressure again began to increase round September 01, 2005, with persistent elevations beginning September 06, 2005.  On September 09, 2005, hydrochlorothiazide 50 mg daily was initiated and the Toprol XL  was increased to 75 mg daily.  His blood pressure remained elevated with a  maximum reading of 159/110 the night of September 09, 2005.  On September 10, 2005, the Toprol was increased to 100 mg daily.  Mr. Putney will continue the  hydrochlorothiazide 50 mg daily, Toprol XL 100 mg daily and clonidine TTS  0.3 mg patch at discharge.   Mr. Nash had intermittent positional headaches beginning August 25, 2005.   He  received Percocet as needed.  The headaches tended to occur when he was  sitting  upright.  The etiology of the headaches was unclear.  CT scan of the  sinuses on August 31, 2005, showed minimal chronic mucosal changes in the left  maxillary sinus.  There was no evidence of acute sinusitis.  The headaches  were felt to possibly be secondary to the lumbar puncture performed on  admission versus a low grade arachnoiditis secondary to the intrathecal  chemotherapy.  He had no focal neurologic deficits.  Dr. Beryle Beams doubted  that the headaches were related to his blood pressure.   Mr. Perillo was diagnosed with a left lower extremity DVT during a previous  admission.  He was maintained on Lovenox during this hospitalization.  The  Lovenox was discontinued for a short time period when his platelet count  declined.  With recovery of the platelet count, the Lovenox was resumed.  He  will continue Lovenox 140 mg daily as an outpatient.   On September 10, 2005, Mr. Gerhardt was felt to be stable for discharge home.  Lab  work showed a hemoglobin of 10.5, white count 1.8, absolute neutrophil count  0.4, platelet count 362,000. LDH 209.  Creatinine 1, potassium 3.  The white  blood cell count differential showed 24% polys and 40% monos.  Dr.  Beryle Beams will continue to make adjustments to the antihypertensive  regimen as an outpatient.   Unfortunately, Mr. Werley Hickman catheter became dislodged prior to his  discharge on September 10, 2005.  A stat portable chest x-ray showed no  evidence for retained catheter fragments and was otherwise normal.   We have scheduled follow-up visit in the office on September 14, 2005, prior to  readmission to proceed with the next cycle of chemotherapy.  He has also  been scheduled for the fourth intrathecal chemotherapy on September 14, 2005,  in the afternoon.   CONSULTATION:  Interventional radiology.   PROCEDURES:  1. Systemic chemotherapy. 2. Intrathecal  chemotherapy.  3. Blood transfusion.   DISCHARGE DIAGNOSES:  1. Burkitt's lymphoma/leukemia.  2. Pancytopenia secondary to chemotherapy.  3. Positional headache, improved at discharge.  4. Hypertension.  5. Mucositis, resolved.  6. Left lower extremity deep venous thrombosis maintained on Lovenox.  7. History of renal dysfunction.  8. Lower extremity rash, improved at discharge.  9. History of methicillin resistant Staphylococcus aureus sepsis status      post 95-month course of vancomycin prior to this admission.   DISCHARGE DISPOSITION:  1. Condition - stable for discharge home.  2. Diet - no restrictions.  3. Activity - increase activity slowly.  4. Wound care - keep neck wound clean and dry.  5. Follow-up - Dr. Beryle Beams September 14, 2005, a.m..   DISCHARGE MEDICATIONS:  1. Wellbutrin XL 300 mg daily.  2. Mycelex [place] dissolved in mouth three times daily.  3. Peridex mouth rinse gargle four times daily.  4. Clonidine TTS patch 0.3 mg change weekly.  5. K-Dur 1 tablet twice daily.  6. Lovenox 140 mg daily.  7. Toprol XL 100 mg daily.  8. Hydrochlorothiazide 50 mg daily.  9. Compazine 10 mg every 4 hours as needed for nausea.      Ned Card, N.P.      Alyson Locket. Beryle Beams, [name]  Electronically Signed    LT/MEDQ  D:  09/10/2005  T:  09/10/2005  Job:  [id number]   cc:   Tammy R. [name], [name]  Fax: [phone]

## 2010-06-20 NOTE — H&P (Signed)
NAMECAYDN, CZAPLA                ACCOUNT NO.:  [id number]   MEDICAL RECORD NO.:  [id number]          PATIENT TYPE:  INP   LOCATION:  [place]                         FACILITY:  [facility]   PHYSICIAN:  [name], M.D.DATE OF BIRTH:  [birth year]   DATE OF ADMISSION:  09/20/2005  DATE OF DISCHARGE:                                HISTORY & PHYSICAL   HISTORY OF PRESENT ILLNESS:  [name] is a 50 year old [place] man  followed by Dr. Beryle [name] for history of Burkitt's lymphoma/acute  lymphocytic leukemia initially diagnosed in May of 2007.  The patient has  had 5 cycles of chemotherapy, most recently started September 14, 2005 and he  received his latest cycle of intrathecal methotrexate and ara-C on September 18, 2005.   The patient called me this morning to report that he had a funny smell like  burning.  He smelled it, but his wife smelled it, as well.  He also felt  like he was burning.  He was prescribed some Tagamet and his reflux symptoms  improved, but this evening he called to state that he was having worsening  headaches, nausea and vomiting and could not keep any of his medications  down.  Accordingly, he is being admitted for hydration, nausea control and  pain control.   PAST MEDICAL HISTORY:  Otherwise significant for:  1. Pancytopenia.  2. Hypertension.  3. Hyperlipidemia.  4. Depression.  5  Hemorrhoids.  1. History of a MRSA sepsis treated with vancomycin.  2. History of acute left lower extremity DVT and superficial right upper      extremity thrombophlebitis on chronic Lovenox.  3. History of renal insufficiency, possibly related to the vancomycin.   ALLERGIES:  He is said to be allergic to [name] in the sense that it  seems to develop renal toxicity.   REVIEW OF SYSTEMS:  Aside from the headaches and the vomiting, he does have  a sore throat; this is not in the oropharynx but below that in the upper  chest but is not clearly related to eating or not  eating.  He denies any  visual changes, although he says his eyes get a little bit dry in the  evening.  He has a little bit of a cough, not productive of any phlegm, but  denies shortness of breath, pleurisy or hemoptysis.  He had a normal bowel  movement today and has no problems with his urine.  He denies any bleeding.  He is not aware of any fever or rash.  A detailed review of systems was  otherwise unremarkable.   SOCIAL HISTORY:  The patient is married, childless.  Nonsmoker, nondrinker.  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   LABORATORY DATA:  Pending at this point.   PHYSICAL EXAMINATION:  GENERAL:  A middle-aged white male who appears  somewhat anxious but otherwise not in acute distress.  VITAL SIGNS:  His temperature is 97.4, respiratory rate 22, heart rate 85  and blood pressure 121/78.  His room air saturation is 78%.  I do not see  any thrush in the oropharynx.  NECK:  There is no peripheral adenopathy.  LUNGS:  Clear to auscultation.  HEART:  Regular rate and rhythm.  ABDOMEN:  Benign.  MUSCULOSKELETAL:  No peripheral edema.   LABORATORY DATA:  Lab work this evening is pending.  On September 18, 2005, the  patient's methotrexate level was 0.2 micromoles per liter his urinalysis  showed a pH of 8.5.  His creatinine was 1.2.  Albumin was 2.8 with a calcium  of 8.9.  LDH was 215, and a CBC showed a white cell count of 12.6,  hemoglobin 9 and platelets 212,000.   IMPRESSION AND PLAN:  A52 year old [place] man with a history of  Burkitt's lymphoma/acute lymphocytic leukemia status post 5 cycles of  chemotherapy, currently day 7 from the start of cycle 5 and day 3 from his  latest intrathecal chemotherapy dose.  Admitted with uncontrolled headache,  nausea and vomiting, as well as dehydration.   The plan is chiefly for supportive care, control of the pain and control of  the nausea.  We will repeat a methotrexate level in the morning.  We  will  correct any lab abnormalities as they become apparent.  Further treatment of  the leukemia as per Dr. Beryle [name], and I believe cycle number 6 is planned  for approximately 2 weeks from now.      [name], [name]  Electronically Signed     GCM/MEDQ  D:  09/20/2005  T:  09/21/2005  Job:  [id number]

## 2010-06-20 NOTE — H&P (Signed)
NAMEBRAYTEN, [name]                ACCOUNT NO.:  [id number]   MEDICAL RECORD NO.:  [id number]          PATIENT TYPE:  INP   LOCATION:  66                         FACILITY:  [facility]   PHYSICIAN:  [name], M.D.DATE OF BIRTH:  02-21-1960   DATE OF ADMISSION:  09/14/2005  DATE OF DISCHARGE:                                HISTORY & PHYSICAL   REASON FOR ADMISSION:  Burkitt lymphoma for next cycle of chemotherapy.   HISTORY OF PRESENT ILLNESS:  [name] is a 50 year old gentleman who was  diagnosed with Burkitt lymphoma/leukemia in April 2007.  He presented at  that time with migratory pain in his back and extremities as well as  weakness, fatigue and fever.  He rapidly developed anemia and  thrombocytopenia.  He also had initial liver function abnormalities.  Outpatient evaluation for an infectious process was unrevealing with a  normal hepatitis profile and negative HIV.  Initial Lyme disease titer was  elevated and he was started on a course of tetracycline.   He was admitted on Jun 26, 2005, due to progressive pain and laboratory  abnormalities.  Review of the peripheral blood smear showed greater than 30%  blasts.  Bone marrow aspirate/biopsy on Jun 29, 2005, was 100% cellular with  over 80% blasts.  The blasts were typical of those seen with Burkitt  lymphoma/leukemia, although chromosome studies failed to reveal the typical  8-14 translocation.  He had extremely aggressive disease with a rapid rise  in the serum LDH up to 18,000, rapid deterioration in his liver function  with development of profound hypoglycemia and lactic acidosis.  Emergency  treatment was initiated.  Initial chemotherapy consisted of a 5-day  induction course with low-dose Cytoxan, steroids and vincristine.  He then  received consolidation chemotherapy with a combination of chemotherapy drugs  including ifosfamide, high-dose methotrexate, Ara-C, etoposide, additional  vincristine and steroids.  He  also began a program of intrathecal  chemotherapy with methotrexate, Ara-C and hydrocortisone.   [name] was admitted on August 18, 2005, to proceed with fourth cycle of  chemotherapy and the third cycle of intrathecal chemotherapy.  There was an  initial delay in initiating chemotherapy due to renal dysfunction felt to  possibly be secondary to the vancomycin that he had been on for MRSA sepsis.  He did receive the third cycle of intrathecal chemotherapy as scheduled on  August 19, 2005.  The systemic chemotherapy was initiated on August 20, 2005,  following improvement in his renal function.  Overall, he tolerated the most  recent cycle of chemotherapy well.  He did develop some mild mucositis, but  was able to tolerate a regular diet throughout the hospitalization.  He  developed positional headaches of unclear etiology during that admission.  CT of the sinuses was negative.  The headache was felt to possibly be  secondary to the lumbar puncture performed on admission versus a low-grade  arachnoiditis related to the intrathecal chemotherapy.   [name] was discharged home on September 10, 2005.  Just prior to his  discharge, his Hickman catheter was accidentally dislodged/removed.  [name] presents today for elective admission to proceed with the fifth of  an anticipated seven cycles of chemotherapy.   PAST MEDICAL HISTORY:  1. Hypertension.  2. Hyperlipidemia.  3. Depression.  4. Hemorrhoids.  5. MRSA sepsis June 2007, source posterior neck abscess, status post 1-      month course of vancomycin.  6. Acute left lower extremity DVT and superficial right upper extremity      thrombophlebitis maintained on Lovenox.  7. History of renal dysfunction, possibly related to vancomycin.   MEDICATIONS:  1. Wellbutrin XL 300 mg daily.  2. Mycelex troche three times daily.  3. Peridex mouth rinse four times daily.  4. Clonidine TTS patch 0.3 mg transdermal, change weekly.  5. K-Dur 20 mEq  twice daily.  6. Lovenox 141 mg daily.  7. Toprol XL 100 mg daily.  8. Hydrochlorothiazide 50 mg daily.  9. Compazine 10 mg every 4 hours as needed for nausea.   ALLERGIES:  VANCOMYCIN, possible renal toxicity.   FAMILY HISTORY:  Noncontributory.   SOCIAL HISTORY:  [name] is married.  He lives in [place].  He does not  have children.  Prior to the lymphoma diagnosis, he worked as a English as a second language teacher for [employer].  No alcohol or tobacco use.   REVIEW OF SYSTEMS:  CONSTITUTIONAL:  [name] reports that overall he feels  well.  His appetite is good.  He denies any fever or chills.  He has had no  bleeding.  He continues to have intermittent headaches, typically occurring  when he is up and ambulating.  No visual disturbance.  CARDIOPULMONARY:  No  shortness of breath or cough.  No chest pain.  No leg swelling.  GASTROINTESTINAL:  No nausea or vomiting.  Bowels moving regularly.  No  diarrhea.  HEENT:  Mouth/throat discomfort has resolved.  GU:  No hematuria  or dysuria.  INTEGUMENTARY:  Lower extremity rash continues to improve.   PHYSICAL EXAMINATION:  VITAL SIGNS:  Temperature 97.1, heart rate 76,  respirations 18, blood pressure 124/82, weight 208.1 pounds.  GENERAL:  Pleasant, Caucasian male in no acute distress.  HEENT:  Alopecia.  Pupils equal, round and reactive to light.  Extraocular  is intact.  Vessels normal.  Sclerae anicteric.  Oropharynx is without  thrush or ulceration.  CHEST/LUNGS:  Clear bilaterally.  Breath sounds slightly diminished at the  right base.  CARDIAC:  Regular rate and rhythm.  ABDOMEN:  Abdomen is soft.  No organomegaly.  EXTREMITIES:  No edema.  NEUROLOGIC:  Alert and oriented.  Motor strength 5/5.  DTRs absent  symmetrically.  Finger-to-nose and rapid alternating movement intact.  Mild  decrease and vibratory sense over fingertips.  Gait is normal.   LABORATORY DATA:  Hemoglobin 12.2, white count 8.7, absolute  neutrophil count 5.8, platelet count 470,000.  Sodium 138, potassium 3.7, BUN 13,  creatinine 1.4, glucose 129.  Bilirubin 1.2, Alkaline phosphatase 54, SGOT  24, SGPT 23, total protein 7.3, albumin 4.2, calcium 10.   IMPRESSION AND PLAN:  1. Burkitt lymphoma/leukemia.  Proceed with elective readmission to begin      cycle number 5/7 chemotherapy for very aggressive Burkitt-like      leukemia/lymphoma.  Mr. Marg has rapid tumor doubling time.  The LDH      and uric acid already starting to rise, now day 25 since initiation of      the last cycle of chemotherapy.  He will receive the fourth cycle  of      intrathecal chemotherapy today with plans to proceed with the fifth      cycle of chemotherapy on September 15, 2005, following prehydration.  2. History of renal dysfunction, possibly related to vancomycin.  3. Intravenous access.  Hickman catheter placement today per      interventional radiology.  4. Intermittent positional headaches.  He has no focal neurologic deficits      on exam.  CSF cytology is all negative to date.  The headaches are      possibly related to a low-grade arachnoiditis.  The dose of intrathecal      methotrexate will be decreased to 10 mg this cycle.  5. Left lower extremity deep venous thrombosis, continue Lovenox.  6. Hypertension.  Blood pressure improved.  Current antihypertensive      regimen consists of Toprol XL, clonidine patch and hydrochlorothiazide.  7. History of methicillin-resistant Staphylococcus aureus sepsis, source      posterior neck abscess, status post 50-month course of vancomycin.   The chemotherapy doses will be based on a height of 6 feet 2 inches, weight  208 pounds and body surface area of 2 sq m.  The chemotherapy will be given  as follows:  Decadron 10 mg IV every 12 hours x5 days; vincristine 2 mg IV  push; methotrexate (150 mg/sq m) 300 mg IV over 30 minutes followed by  methotrexate (1.35 g/sq m) 2700 mg IV over 23.5 hours; Cytoxan  (500 mg/sq m)  1000 mg IV daily x2 after completion of the methotrexate IV infusion;  Adriamycin (50 mg/sq m) 100 mg IV x1 after completion of the methotrexate  infusion; leucovorin (50 mg/sq m) 100 mg IV 36 hours after initiation of  methotrexate and leucovorin (15 mg/sq m) 30 mg IV every 6 hours until serum  methotrexate level is less than 0.05 micromolar.  The chemotherapy is  scheduled to begin on September 15, 2005.   On the day of admission, September 14, 2005, he will receive the fourth dose of  intrathecal chemotherapy consisting of methotrexate 10 mg, cytosine  arabinoside 40 mg and hydrocortisone 50 mg per interventional radiology.   The patient interviewed and examined by Dr. Beryle Beams; plan reviewed.      Ned Card, N.P.      Alyson Locket. Beryle Beams, [name]  Electronically Signed    LT/MEDQ  D:  09/15/2005  T:  09/15/2005  Job:  [id number]   cc:   Tammy R. [name], [name]  Fax: [phone]

## 2010-06-20 NOTE — Discharge Summary (Signed)
NAMEJOANDRY, [name]                ACCOUNT NO.:  [id number]   MEDICAL RECORD NO.:  [id number]          PATIENT TYPE:  INP   LOCATION:  [place]:  [facility]   PHYSICIAN:  [name], M.D.DATE OF BIRTH:  1960/07/06   DATE OF ADMISSION:  06/26/2005  DATE OF DISCHARGE:                                 DISCHARGE SUMMARY   I am repeating this discharge summary on this man.  The initial dictation  got interrupted in the middle of the dictation due to equipment failure.   [name] is a 50 year old Caucasian man who had been in overall good health  except for treated hypertension, hyperlipidemia and depression.  He  presented with a 68-month history of migratory severe and progressive bone  pain, initially starting in his back, and then radiating to his legs and  shoulders.  An outpatient evaluation including MRI of the brain and spine,  chest x-ray, and CT scan of the abdomen and pelvis were unremarkable.  Initial laboratory studies showed transaminase elevations and he was  initially felt to have a hepatitis or another infectious problem.  Hepatitis  profile was unremarkable.  HIV serology was negative.  Initial results were  positive for Lyme disease.  He was started on a course of tetracycline 2  days prior to admission.  His CBC which initially was normal began to change  rapidly, with rising white cells and falling hemoglobin and platelet count.  A CBC done on Jun 21, 2005, through the emergency department prior to  admission showed a hemoglobin of 14; hematocrit 42; white count 17,500; and  platelet count of 66,000.   CBC on admission May 25 showed a white count of 19,000; hemoglobin down to  11.5; and platelets 17,000.  Hematology consultation was requested at that  point.  A repeat CBC was ordered for the next morning with a differential.  On my review of the blood there were a large percentage of blasts.  The  total white count was up to 28,000 with  28% blasts; 15% neutrophils; 57%  lymphocytes; platelet count down to 20,000; hemoglobin 11.5.   Findings were highly suspicious for acute leukemia.  The patient was  transferred to the hematology service.  He was started on vigorous hydration  and allopurinol.  An echocardiogram was done to assess baseline cardiac  function and showed an ejection fraction of 65-75% with normal wall motion.   On my initial physical examination he was afebrile.  There were no  ecchymosis, petechiae or rash on the skin, no erythema or exudate in the  pharynx, no cardiac murmurs, no lymphadenopathy, no palpable organomegaly,  no focal neurologic deficits.   HOSPITAL COURSE:  Bone marrow aspiration and biopsy were done on Jun 29, 2005.  On my review of the bone marrow aspirate the predominant cell was a  medium-size blast with an immature nucleus with multiple nucleoli and  prominent cytoplasmic vacuoles typical of what is seen with Burkitt's  lymphoma/leukemia.  Differential count on the aspirate showed 93% blasts.  The core biopsy was 100% cellular and the predominant cell  was a blast with  only a rare myeloid precursor and rare megakaryocyte.   Initial serum uric acid was significantly elevated at 11.2.  Serum  creatinine increased at 1.3, bilirubin 1.7.  Serum LDH rose rapidly from  initial value of 3600 on May 25  up to [place] by May 27.  I felt there was an  urgent need to initiate chemotherapy.  Risks and benefits of treatment  discussed at length with the patient and his wife.  Options to be treated  and [facility] a [facility] was given to them.  They elected to stay in [place].   Based on height 6 feet 2 inches, weight 239 pounds, body surface area 2.4  sqm he began an aggressive treatment program.  He received Cytoxan 200  mg/sqm over 30 minutes daily x5 doses with vincristine 2 mg IV push and  prednisone 100 mg p.o. daily x5 days in divided doses as initial  induction  therapy.  He was given very aggressive fluid support.  His uric acid level  went up despite allopurinol and his LDH rapidly rose to 18,000.  He was  given a dose of parenteral rasburicase to rapidly lower the uric acid.  His  liver functions began to deteriorate rapidly.  His creatinine began to rise.  A serum lactic acid was markedly elevated.  I was concerned that the  leukemia was overwhelming his system.  He became profoundly hypoglycemic.  I  felt that he was going into shock liver.  He was transferred to the  intensive care unit.  He was given D-10 glucose solution to maintain his  blood sugars.  He received a platelet transfusion.  A percutaneous central  venous catheter was placed in the left arm with the assistance of  interventional radiology for the chemotherapy and for anticipated need for  blood transfusions and antibiotics.   Fortunately, his condition stabilized with the initiation of the  chemotherapy and steroids.  Uric acid began to fall.  Renal function  normalized.  LDH began to fall.  Within 48 hours, LDH down to 5000, uric  acid down to 5.7.  He developed steroid-induced diabetes.  Insulin was given  as needed to control his sugars.  There was then a rapid and dramatic fall  in his blood counts.  By day #4 total white count was down from 48,000 to  0.9.  Platelets remained low and transfusion support was given as needed.  Plan was to continue with the chemotherapy with additional drugs beginning  on day #8.  There was a 2-day delay in starting the additional drugs.  In  this brief period of time his serum LDH began to rapidly rise again just 11  days from initial treatment.  Renal function again began to deteriorate and  liver functions to rise.  Immediate treatment was reinitiated.  On July 09, 2005, with the assistance of interventional radiologist a lumbar puncture was done and received his first dose of intrathecal chemotherapy with 15 mg  of  methotrexate, 40 mg of ara-C, and 50 mg of hydrocortisone.  He tolerated  this well.  He went on to receive his second induction course of  chemotherapy consisting of ifosfamide 800 mg/sqm, total dose 1900 mg, mixed  with an equivalent amount of mesna uroprotectant given daily x5 days;  methotrexate 150 mg/sqm, total dose 360 mg IV over 30 minutes followed by a  continuous infusion over 23.5 hours of 1.35 g/sqm, total dose 3240  mg.  He  received an additional dose of vincristine on day #1, and then on day #4 and  #5 an infusion of ara-C 150 mg/sqm, total dose 360 mg over 24 hours x2 days  and etoposide 80 mg/sqm, total dose 190 mg IV over 1 hour on days #4 and #5.  Once again he tolerated the chemotherapy well.   Due to my concern over his rising creatinine and inability to adequately  alkalinize his urine despite the use of parenteral and oral bicarbonate, I  abbreviated his methotrexate infusion.  He received approximately 12 of  planned 23 hours of the infusion.   Once again, with aggressive alkaline hydration and close monitoring his  metabolic profile and hematology profile stabilized.  There was a slow but  steady normalization of his LDH which did reach the normal range for 2 days  by June 25 with value down to 231.  Almost as soon as his blood count  started to recover again from the chemotherapy, LDH once again started to  rise to a peak value of 311 by June 27.  Around the same time there was also  rise in his transaminases and serum alkaline phosphatase.  I was concerned  that once again the leukemia was beginning to grow.  The third induction  cycle was therefore initiated.  He received another dose of triple  intrathecal chemotherapy with the assistance of interventional radiologist  on July 29, 2005.  He received the initial planned portion of the third  chemotherapy cycle also starting on June 27 with Cytoxan 500 mg/sqm over 1  hour given daily x2, vincristine 2 mg IV push  day #1, then Adriamycin 50  mg/sqm IV day #2.  I elected to modify the program so that he had some  treatment working on him and allow a brief discharge from the hospital over  72 hours and then to complete the planned cycle on July 2 with readmission  to the hospital for an infusion of methotrexate in the same doses as the  previous cycle.   Lab at time of discharge on June 28 showed hemoglobin 8.4; hematocrit 25;  white count 11,500 with 91% neutrophils; 4 lymphocytes; 5 monocytes; and  platelet count 164,000.  Serum LDH of 264, upper normal 250.  BUN 6,  creatinine 0.7, SGOT 22, SGPT 62, alkaline phosphatase 157, albumin 1.8.   He developed a number of infectious complications.  He had an initial  vesicular rash in the posterior pharynx.  He was started on a course of  Valtrex.  Cultures ultimately did not grow herpes.   He developed a spontaneous abscess on the back of his neck.  He developed a concomitant cellulitis of the soft tissues of the nasal cartilage.  He was  started on broad-spectrum parenteral antibiotics, initially with  ceftazidime.  However, within 24 hours of starting antibiotics he spiked to  over 102 degrees.  Single blood culture was obtained and within 12 hours was  growing methicillin-resistant Staphylococcus aureus.  This organism was also  cultured from his neck wound.   Vancomycin was added to his regimen.  He had intermittent high fever to 103  degrees for the next 5 days.  Fortunately, his blood counts started to  recover.  Temperature curve began to improve.  However, I was still  concerned that the PICC catheter was infected and therefore it was removed  on July 27, 2005.  After he was afebrile for greater than 48 hours, with the  assistance of interventional radiology, on the day of discharge a double-  lumen Hickman-type catheter was placed in the left subclavian position  without complication.   He developed some transient diarrhea initially during  the admission which  resolved spontaneously and was negative for C difficile.  He developed some  transient hemorrhoidal bleeding.  Fortunately, this was self-limited.   He had one episode of significant epistaxis at time when platelets were very  low.  This responded to use of Amicar and platelet transfusions.   The leukemia, the treatment, and the infection and antibiotics suppressed  his appetite so that he was not able to eat.  He was put on parenteral  nutritional support for the majority of the hospitalization.   He developed grade II-III oral mucositis from the methotrexate chemotherapy  which slowly resolved as his counts recovered.  Leucovorin was given as  prescribed beginning 24 hours after the conclusion of the methotrexate  infusion until levels were in a safe range.   CONSULTATIONS:  1.  Hematology/oncology.  2.  Interventional radiology.   DIAGNOSES:  1.  Burkitt's lymphoma/leukemia.  2.  Leukocytosis, anemia and thrombocytopenia secondary to #1.  3.  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  4.  Acute liver failure with associated lactic acidosis due to rapidly      growing leukemia.  5.  Hypoglycemia due to acute liver failure.  6.  Methicillin-resistant staphylococcal neck abscess.  7.  Methicillin-resistant Staphylococcus aureus septicemia.  8.  Cellulitis cartilage of the nose.  9.  Chemotherapy-induced mucositis.  10. Steroid-induced hyperglycemia.  11. Hemorrhoidal bleeding.  12. Epistaxis secondary to thrombocytopenia.  13. Treated hypertension.  14. Hyperlipidemia.  15. History of depression.   DISPOSITION:  Condition is stable at time of discharge.  He is afebrile.  Blood pressure is 140/90, pulse 85 and regular.  The neck abscess is  healing.  The nasal cellulitis has healed completely.  He is not having any  hemorrhoidal bleeding at this time.  His oral mucositis has resolved.  He will return home.  Light activity over  the weekend.  Arrangements will be  made to administer of vancomycin 1750 mg IV daily until readmission to the  hospital on Monday, August 03, 2005.  He is instructed to keep his neck wound  clean and dry and change dressing twice a day.   Medications to include:  1.  Wellbutrin XL 300 mg daily.  2.  Claritin 10 mg daily.  3.  Clonidine-TTS -1 patch changed weekly.  4.  Decadron 4 mg three tablets twice daily x3 days.  5.  Compazine 10 mg q.4h. p.r.n. nausea or vomiting.   PROCEDURES:  1.  Bone marrow aspiration and biopsy.  2.  Placement of PIC central catheter.  3.  Removal of PIC catheter.  4.  IV Chemotherapy for leukemia/lymphoma x 3  5.  Intrathecal chemo for leukemia/lymphoma x 2  6.  Echocardiogram.  7.  Placement of Hickman-type central catheter.  8.  Blood and platelet transfusions.      Alyson Locket. Beryle Beams, [name]  Electronically Signed     JMG/MEDQ  D:  07/30/2005  T:  07/30/2005  Job:  [id number]   cc:   Tammy R. [name], [name]  Fax: [phone]   Sherryl Manges, [name]

## 2010-06-20 NOTE — H&P (Signed)
NAMEREDMOND, CLARE                ACCOUNT NO.:  [id number]   MEDICAL RECORD NO.:  [id number]          PATIENT TYPE:  INP   LOCATION:  25                         FACILITY:  [facility]   PHYSICIAN:  Izola Price. Benay Spice, [name] DATE OF BIRTH:  [birth year]   DATE OF ADMISSION:  10/05/2005  DATE OF DISCHARGE:                                HISTORY & PHYSICAL   REASON FOR HOSPITALIZATION:  Burkitt-like lymphoma versus adult ALL, to  proceed with the sixth of a planned seven cycles of chemotherapy.   HISTORY OF PRESENT ILLNESS:  Mr. Konda is a 50 year old gentleman who  presented in April 2007 with migratory pain in his back and extremities as  well as weakness, fatigue and fever.  He rapidly developed anemia and  thrombocytopenia.  He had a initial liver function abnormalities as well.  Outpatient evaluation for infectious process was unrevealing with a normal  hepatitis profile and negative HIV.  Initial Lyme disease titer was elevated  and he was started course of tetracycline.   Mr. Alejandro was admitted on Jun 26, 2005 due to progressive pain and laboratory  abnormalities.  Review of the peripheral blood smear showed greater than 30%  blasts.  Bone marrow aspirate/biopsy of Jun 29, 2005 was 100% cellular with  over 80% blasts.  The blasts were typical of those seen with Burkitt  lymphoma/leukemia although chromosome studies failed to reveal the typical  of 814 translocation.  He had extremely aggressive disease with a rapid rise  in the serum LDH up to 18,000 and rapid deterioration in his liver function  with development of profound hypoglycemia and lactic acidosis.  Emergency  treatment was initiated.  Initial chemotherapy consisted of a 5-day  induction course with low-dose Cytoxan, steroids and vincristine.  He then  received consolidation chemotherapy with a combination of chemotherapy drugs  including of Ifosfamide, high-dose methotrexate, Ara-C, etoposide,  additional vincristine and  steroids.  He also began a program of intrathecal  chemotherapy with methotrexate, Ara-C and hydrocortisone.  Mr. Perelli has  completed five of a planned seven cycles of chemotherapy at the time of this  admission.   Mr. Guglielmo course has been complicated by MRSA sepsis, source posterior neck  abscess; grade to be decided; nasal soft tissue cellulitis; scalp  cellulitis; right inguinal candida infection; acute left lower extremity DVT  and a superficial right upper extremity thrombophlebitis now maintained on  Lovenox; renal dysfunction July 2007, possibly vancomycin related renal  toxicity with subsequent normalization of renal function; accidental  dislodgement/removal of the Hickman catheter August 2007 with subsequent  replacement.  He most recently required admission at [facility]  September 20, 2005 through October 01, 2005 with delayed nausea and vomiting  secondary to chemotherapy and headaches.  Symptoms were resolved at  discharge.   During that admission, with recovery of the blood counts, the serum LDH  began to rise.  The etiology of this rise was unclear.  Bone marrow biopsy  on September 28, 2005 showed that he is in remission.  He was evaluated by Dr.  Tomasa Hosteller at [facility]  on September 30, 2005 with the  recommendation to continue the current program of treatment.   Mr. Wallen presents today for elective admission to proceed with the sixth of  a planned seven cycles of chemotherapy.  He will receive the fifth dose of  intrathecal chemotherapy with methotrexate, Ara-C and hydrocortisone as  well.   PAST MEDICAL HISTORY:  1. Burkitt like lymphoma versus adult ALL.  2. Hypertension.  3. Hyperlipidemia.  4. Depression.  5. Hemorrhoids.  6. MRSA sepsis June 2007, source posterior neck abscess, status post 1      month course of vancomycin.  7. Acute left lower extremity DVT and superficial right upper extremity      thrombophlebitis maintained on  Lovenox.  8. History of renal dysfunction, possibly vancomycin related renal      toxicity.   MEDICATIONS:  Wellbutrin XL 300 mg daily, clonidine TTS one patch changed  weekly, Toprol XL 50 mg daily.  Compazine 10 mg every 4 hours as needed for  nausea, Protonix 40 mg daily.   ALLERGIES:  VANCOMYCIN, POSSIBLE RENAL TOXICITY.   FAMILY HISTORY:  Noncontributory.   SOCIAL HISTORY:  Mr. Krus is married.  He lives in [place].  He does not  have children.  He previously was employed as a Building surveyor for  the [employer]  No alcohol or tobacco use.   REVIEW OF SYSTEMS:  Mr. Jacquot denies any fever.  Over the weekend he did note  that his head felt cold with associated sweats.  Oral intake is good.  No  headaches.  No vision change.  No nausea or vomiting.  No shortness of  breath or cough.  Bowels moving regularly.  No hematochezia or melena.  No  hematuria or dysuria.  Left leg swelling has resolved.  No mouth sores.   PHYSICAL EXAM:  VITAL SIGNS:  Temperature 97.9, heart rate 77, respirations  20, blood pressure 138/90, oxygen saturation 97% on room air.  GENERAL:  Pleasant Caucasian male in no acute distress.  HEENT:  Alopecia.  Pupils equal round and reactive to light.  Extraocular is  intact.  Sclerae anicteric.  Oropharynx is without thrush or ulceration.  Right posterior neck wound has healed.  There is some erythema at the  previous wound site.  CHEST/LUNGS:  Clear bilaterally.  Breath sounds slightly diminished at the  right base.  No wheezes or rales.  CARDIOVASCULAR:  Regular rate and rhythm.  ABDOMEN:  Soft.  No organomegaly.  EXTREMITIES:  No edema.  NEURO:  Alert and oriented.  Motor strength 5/5.   LABORATORY DATA:  Hemoglobin 8.9, white count 15, absolute neutrophil count  14, platelets 191,000.  Sodium 139, potassium 3.6, chloride 106, CO2 27, glucose 128, BUN 19, creatinine 1, bilirubin 0.8, alkaline phosphatase 41,  SGOT 20, SGPT 21, total  protein 5.3, albumin 3.4, calcium 9, LDH 258.   IMPRESSION:  1. Burkitt-like lymphoma versus adult ALL.  2. Hypertension.  3. History of left lower extremity deep venous thrombosis, maintained on      Lovenox.  4. History of source of sepsis, source posterior neck abscess, status post      1 month course of vancomycin.  5. History of renal failure, possibly vancomycin related renal toxicity.   PLAN:  Mr. Winner appears stable for chemotherapy as scheduled.  Chemotherapy  dosages will be based on a height of 6 feet 1 inch, weight 200 pounds, body  surface area 2 m2.  He will receive the  intrathecal methotrexate, Ara-C and  hydrocortisone today.  The systemic chemotherapy will begin on October 07, 2005 as follows:  Methotrexate (150 mg/m2) 300 mg over 30 minutes then  methotrexate (1.35 grams per meter squared) 2700 mg IV over 23.5 hours with  leucovorin rescue; Ifosfamide (800 mg/m2) 1600 mg IV days one through five;  Ara-C (150 mg/m2) 300 mg by continuous IV infusion days four and five;  etoposide (80 mg meter squared) 160 mg IV over 1 hour days four and five;  vincristine was given on September 28, 2005 and Decadron was given September 27, 2005 through October 02, 2005.  He will receive Rituxan (375 mg/ m2) 750 mg  IV on day 6.   The patient interviewed and examined by Dr. Benay Spice; plan reviewed.      Ned Card, N.P.    ______________________________  Izola Price Benay Spice, [name]    LT/MEDQ  D:  10/07/2005  T:  10/07/2005  Job:  [id number]   cc:   Tammy R. [name], [name]  Fax: [phone]   Donato Heinz, [name]  [facility]

## 2010-06-20 NOTE — H&P (Signed)
[name], [name]                ACCOUNT NO.:  [id number]   MEDICAL RECORD NO.:  [id number]          PATIENT TYPE:  INP   LOCATION:  0103                         FACILITY:  [facility]   PHYSICIAN:  Sherryl Manges, [name]  DATE OF BIRTH:  [birth year]   DATE OF ADMISSION:  06/26/2005  DATE OF DISCHARGE:                                HISTORY & PHYSICAL   PMD:  Tammy R. [name], [name]   CHIEF COMPLAINT:  Recurrent fever, myalgia, arthralgia, weakness for several  weeks.   HISTORY OF PRESENT ILLNESS:  This is a 50 year old male.  For past medical  history, see below.  Patient is a very good historian.  According to him, at  the end of April, 2007, he suddenly developed low back pain, with  excruciating bilateral leg pain.  Denies antecedent illness or trauma.  This  appeared to subside after a while.  One week later, he traveled to [place] where he spent three days.  Upon return, he was  quite well for about one week, and then symptoms of back pain and leg pain  recurred.  This continued off and on.  As the weeks continued, he continued  to have pain, recurrent low-grade fevers, sweats, myalgias, and arthralgias.  He also felt very fatigued and weak.  Denies any rash.  Denies cough.  Denies shortness of breath.  Has not observed any ticks during his sojourn  in the [place], or just after.  He went to see his primary MD in the  second week in May and was given analgesics, initially, oxycodone, which  according to him, dried me out and worsened my sweats.  Subsequently,  hydromorphone.  There were no real changes in symptoms.  He was started on a  prednisone taper approximately one week ago, which he has now completed.  On  March 24, 2005, he went to the emergency department because of severe leg  pains, felt that his legs were difficult to move, but there was no  objective weakness.  He was treated at the time with analgesics, started on  another prednisone  taper and subsequently allowed home.  He saw his PMD on  Jun 22, 2005, and was noted to have an elevated white cell count.  He saw  his PMD again on Jun 24, 2005.  White cell count had increased further and  patient now had thrombocytopenia, as well as buccal hemorrhagic blisters.  White cell differential, showed monocytes and atypical cells.  Based on  this, his PMD referred him to be seen by Dr. Ralene Ok on July 07, 2005.  He  was started on doxycycline on Jun 24, 2005, presumably due to positive  screening test for Lyme disease.  The patient is still symptomatic and was  referred for admission on Jun 26, 2005.   PAST MEDICAL HISTORY:  1.  Hypertension.  2.  Dyslipidemia.  3.  Erectile dysfunction, likely secondary to ACE inhibitor.   MEDICATIONS:  1.  Catapres-TTS patch 0.3 mg 1 patch applied to skin daily.  2.  Doxycycline 100 mg p.o. b.i.d. since Jun 24, 2005.  3.  Wellbutrin XL 300 mg p.o. daily.  4.  Zyrtec 10 mg p.o. p.r.n. daily for allergies.  5.  Hydromorphone 4 mg p.o. p.r.n. q.6h.  6.  Prednisone taper.   ALLERGIES:  No known drug allergies.   REVIEW OF SYSTEMS:  Essentially as per HPI and chief complaint, otherwise  negative.   SOCIAL HISTORY:  The patient is married.  Has no offspring.  Works as a  Building control surveyor at [employer].  Nonsmoker.  Drinks  alcohol only seldom.  Denies alcohol abuse.   He has no siblings.  His mother is alive and well.  His father died from  COPD, was hypertensive, and had cerebrovascular disease.  Family history is  otherwise noncontributory.   PHYSICAL EXAMINATION:  VITAL SIGNS:  Temperature 98.1, pulse 96 per minute  and regular, respiratory 18, BP 155/99 mmHg.  Pulse oximetry 99% on room  air.  GENERAL:  Patient does not appear to be in obvious acute distress.  Alert,  communicative.  Not short of breath at rest.  HEENT:  No clinical pallor.  No jaundice.  No conjunctival injection.  Throat is quite clear.   No buccal lesions are seen.  NECK:  Supple.  JVP not seen.  No palpable lymphadenopathy.  No palpable  goiter.  No axillary or inguinal lymphadenopathy.  CHEST:  Clinically clear to auscultation.  No wheezes or crackles.  HEART:  Heart sounds S1 and S2 heard.  Normal.  Regular.  No murmurs.  ABDOMEN:  Moderately obese.  Soft and nontender.  There is no palpable  organomegaly.  No palpable masses.  Normal bowel sounds.  EXTREMITIES:  No pitting edema.  Palpable peripheral pulses.  MUSCULOSKELETAL:  Patient has full range of motion of all joints.  He has  localized tenderness in the mid thoracic spine without paraspinal muscle  spasm.  Straight leg raising test is negative bilaterally.  SKIN:  No rashes are observed.  CENTRAL NERVOUS SYSTEM:  No focal neurological deficits on gross  examination.   INVESTIGATIONS:  CT of the abdomen/pelvis dated Jun 21, 2005 performed at  [facility].  This showed fatty infiltration of the  liver/adenopathy in the hepatorenal ligament.  Query gallstones.  Query 8 cm  left adrenal nodule.   Pelvic CT scan showed no acute findings, although the patient did have  sigmoid diverticulosis.   CBC on Jun 24, 2005:  WBC 19.1, hemoglobin 13.6, hematocrit 40.8, platelets  43.  Differential showed monocytes and atypical lymphocytes.  LFTs on Jun 22, 2005 showed AST 95, ALT 44, alkaline phosphatase 100.  Hepatitis A and B  serology was negative.  EBV serology showed the following findings:  VCA IgG  5.43, elevated.  NA IgG 5.72, elevated.  VCA IgM 0.07, normal.  EA IgG 0.62  normal.  Findings indicative of past infection.  [facility] spotted  fever IgM was 0.07, i.e., negative, on Jun 24, 2005.   ASSESSMENT/PLAN:  1.  Pyrexia of unknown origin:  Etiology is rather murky at present.  The      patient has already been started on doxycycline, based on presumptive     evidence of possible Lyme disease.  We will continue this for now.      Acute  hepatitis has been effectively ruled out, with appropriate      serology, as has [facility] spotted fever.  A septic workup is, of      course, in order.  We will  resend blood cultures, do urinalysis, and      chest x-ray, and arrange 2D echocardiogram.  We will also do HIV      test/HIV viral load, provide the patient consents to this.  It is clear      that ID input will be useful here.  We shall consult accordingly.   1.  Back pain:  The patient describes low back pain, however, on physical      examination, he has mid thoracic tenderness and negative straight leg      raising.  In view of pyrexial illness, we shall attempt to rule out      spinal pathology.  We have therefore arranged MRI, thoracic, and      lumbosacral spine.  Meanwhile, we will continue analgesics and physical      therapy/occupational therapy.   1.  Hypertension:  We shall continue pre-admission antihypertensives.   1.  Dyslipidemia:  Patient was previously on Crestor; however, this has been      stopped, likely because of patient's myalgias and arthralgias.  For      completeness, we shall do creatinine kinase levels as well as TSH.   Further management will depend on clinical course.      Sherryl Manges, [name]  Electronically Signed     CO/MEDQ  D:  06/26/2005  T:  06/26/2005  Job:  [id number]   cc:   Tammy R. [name], [name]  Fax: [phone]

## 2010-06-20 NOTE — Consult Note (Signed)
[name], [name]                ACCOUNT NO.:  [id number]   MEDICAL RECORD NO.:  [id number]          PATIENT TYPE:  INP   LOCATION:  [id number]                         FACILITY:  [facility]   PHYSICIAN:  [name], M.D.DATE OF BIRTH:  Jan 19, 1961   DATE OF CONSULTATION:  06/26/2005  DATE OF DISCHARGE:                                   CONSULTATION   The patient is a very pleasant 50 year old white male seen at the request of  the hospitalist service for blood count abnormalities in the setting of  three to four weeks of intermittent severe arthralgias, low-grade fever, and  sweats. He has been evaluated prior to admission by  his primary care  physician, Dr. Jae [name]. [name], and once in the [facility] emergency  department. He is scheduled to see Dr. Laurey [name] on Jun 30, 2005, at the  [facility].   The patient had been in his usual state of good health until the onset of  low back pain in April, mild initially, and then progressively more severe.  The pain has been intermittent since then in his low back, buttocks, lower  extremities including knees and feet, and in his scapular areas, right  greater than left. He has had no known injury and has not been aware of any  joint swelling, erythema, or heat. His fever has been intermittently as high  as 100 degrees, though in the past 24 hours only up to 99 degrees. He has  had perhaps three episodes of shaking chills, though these are not followed  by fever. He has had very mild and very occasional headaches and one episode  of heaviness in his lower extremities, though he was ambulatory despite  that. He has had no other neurologic symptoms, and no respiratory, GI, or  urinary symptoms. His only bleeding has been some subcutaneous hematomas in  his buccal mucosa and some minimal bleeding once from the left nares. He has  not seen any rash. He had had no new medications or known exposures  including tick bites prior to onset  of illness. The pain is abrupt in onset  and extremely severe at times, lasting up to 15 to 30 minutes. He has used  Vicodin and the Percocet, and is now using Dilaudid for the pain.  Workup  has included labs by Dr. Modena [name] and in the emergency department, blood  cultures by Dr. Modena [name] which have been negative, MRI of the head and possibly  spine done at [facility] about 10 days ago, that report not available, but  per the patient spine narrowed and we will try to get a copy of that  report. He had CT of the abdomen and pelvis done on May 20th which showed  some adenopathy in the hepatoduodenal ligament up to 1.4 cm lymph nodes,  unremarkable spleen, some fatty infiltrate in the right side of his liver,  and some diverticular disease without diverticulitis. He has had negative  hepatitis, [facility] Spotted Fever, and Epstein-Barr titers. Per  history, Lyme titers were positive and he was started on doxycycline on May  23rd. A chest x-ray done this admission revealed no acute findings on  preliminary report. He has not been on any other antibiotics. He has had  prednisone which may have been 60 mg initially on a taper for a week from  Dr. Modena [name], that completed about May 15th and again has been on prednisone at  60 mg daily since May 21st.   REVIEW OF SYSTEMS:  Otherwise, weight has been stable other than about one  or two pounds of weight loss.   FAMILY HISTORY:  Noncontributory.   SOCIAL HISTORY:  Married, no tobacco or alcohol. He does not have children.  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 No family or co-  workers have had similar symptoms.   LABORATORY DATA:  Baseline bloodwork from  November 2006 with white count of  5.9, hemoglobin 15.7, platelet count 221,000, and CMET then normal including  BUN of 13, creatinine 0.9, and normal liver chemistries. From May 21st,  white count  of 18,000, hemoglobin 13.4, platelet count 49,000, ANC 5.6.  Pathology review with left shift with monocytosis, but no definite blast and  a few nucleated red cells noted. BUN 19, creatinine 1.2, SGOT 95, SGPT 44.  ESR was 56 on May 20th. Labs from May 23rd with white count of 19.1,  hemoglobin 13.9, and platelet count 43,000. On admission white count of  19,400 on 60  mg of prednisone this morning, hemoglobin 11.5, platelet count  17,000. PT 14.6, INR 1.1, PTT 25. D-dimer 2.39. Sodium 135, potassium 3.7,  chloride 103, CO2 23, glucose 122, BUN 30, creatinine 1.6, total bilirubin  1.1, alkaline phosphatase 247, SGOT 152, SGPT 138, total protein 6.1,  albumin 3.1, calcium 9.2. CK 177.   PHYSICAL EXAMINATION:  GENERAL: On exam, he is a very pleasant, well-  developed, well-nourished appearing gentleman who looks his stated age. He  is diaphoretic, but not in acute distress in bed. Wife present and very  supportive.  VITAL SIGNS: Temperature 97.9, heart rate 80 and regular, respirations 20  and unlabored on room air, blood pressure 138/88, 95% saturation on room  air.  HEENT: Pupils equal and reactive. He is nonicteric. Mouth is clear, other  than subcutaneous hemorrhages on the buccal mucosa and oral mucosa is  slightly dry.  NECK: Supple.  LUNGS: Clear. He does not have palpable peripheral adenopathy.  HEART: Regular rate and rhythm. No gallop.  ABDOMEN: Soft and nontender. Active bowel sounds. No hepatosplenomegaly.  EXTREMITIES: Lower extremity with no edema, cords, or tenderness. He has no  petechia. He has no bleeding in his IV site. He has very minimal bruising at  the IV punctures. IV fluid presently half-normal saline at 100.   Admission orders have continued the doxycycline, but he is not presently on  steroids.   Peripheral smear reviewed. Some fragmented red cells, a platelets, left  shift, though no obvious blasts.   IMPRESSION AND PLAN:  Severe arthralgias, low fever,  sweats,  thrombocytopenia, mild anemia, and elevated white count: He does not appear  overtly septic or in frank DIC. LDH is pending, but he does not appear  clearly TTP because of just low fevers, the mild increase in BUN and  creatinine which are likely some dehydration secondary to sweating, and no  very clear neurologic symptoms. He does not appear to be ITP (and note no  response of the platelets  to steroids). Question infectious etiology or  possibly primary bone marrow process. I would agree with asking infectious  disease to see. Follow-up labs pending. Will transfuse platelets tonight if  bleeding, otherwise if they drop further. May need bone marrow examination  when possible with the holiday weekend. Would continue his IV fluids and  p.r.n. pain medication. Our service will follow through the weekend.   Thank you for the consultation.      [name], [name]  Electronically Signed     LPL/MEDQ  D:  06/26/2005  T:  06/28/2005  Job:  [id number]   cc:   Tammy R. [name], [name]  Fax: [phone]

## 2010-06-20 NOTE — Discharge Summary (Signed)
[name], [name]                ACCOUNT NO.:  [id number]   MEDICAL RECORD NO.:  [id number]          PATIENT TYPE:  INP   LOCATION:  [place]                         FACILITY:  [facility]   PHYSICIAN:  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   HISTORY OF PRESENT ILLNESS:  [name] is a 50 year old gentleman who  initially presented April 2007 with pain in his back and extremities as well  as weakness, fatigue and fever.  He rapidly developed anemia and  thrombocytopenia and also had initial liver function abnormalities.  Outpatient evaluation for an infectious process was unrevealing with a  normal hepatitis profile and negative HIV.  Initial Lyme disease titer was  elevated and he was started on a course of tetracycline.   Due to progressive pain and laboratory abnormalities, [name] was admitted  to the hospital Jun 26, 2005.  The peripheral blood smear showed greater  than 30% blasts.  Bone marrow aspirate/biopsy Jun 29, 2005, was 100%  cellular with over 80% blasts.  The blasts were typical of those seen with  Burkitt's lymphoma/leukemia, though the chromosome studies failed to reveal  the typical of 8;14 translocation.  The serum LDH rapidly rose to 18,000 and  he had rapid deterioration in his liver function with development of  profound hypoglycemia and lactic acidosis.  Emergency treatment was  initiated.  Initial chemotherapy consisted of a 5-day infection course with  low-dose Cytoxan, steroids and vincristine.  This was followed by  consolidation chemotherapy with a combination of chemotherapy drugs  including ifosfamide, high-dose methotrexate, Ara-C, etoposide, additional  vincristine and steroids.  He also began a program of intrathecal  chemotherapy with methotrexate, Ara-C and hydrocortisone.  At the time of  this admission [name] had completed six cycles of chemotherapy.   His course has been complicated by MRSA sepsis with a posterior neck  abscess; nasal soft tissue cellulitis; scalp cellulitis and small right  inguinal Candida infection; acute left lower extremity DVT and superficial  right upper extremity thrombophlebitis now maintained on Lovenox; renal  dysfunction July 2007, possibly vancomycin related renal toxicity with  subsequent normalization; accidental dislodgement/removal of Hickman  catheter August 2007, with subsequent replacement and most recently a coag-  negative staph bacteremia September 2007, currently on IV vancomycin.   [name] presented on October 28, 2005, for elective admission to proceed  with the seventh cycle of chemotherapy as well as the sixth dose of  intrathecal chemotherapy.   ADMISSION VITAL SIGNS:  Temperature 97.5 heart rate 73, respirations 18,  blood pressure 157/96, weight 218.5 pounds.   ADMISSION PHYSICAL EXAMINATION:  GENERAL APPEARANCE:  A pleasant Caucasian  male in no acute distress.  HEENT:  Normocephalic, atraumatic.  Alopecia.  Pupils equal, round and  reactive to light.  Extraocular movements intact.  Discs sharp.  Vessels  normal.  Oropharynx without thrush.  Buccal region mildly  erythematous.  No  ulcerations.  CHEST/LUNGS:  Clear bilaterally.  Breath sounds slightly diminished at the  right lung base.  No wheezes or rales.  CARDIOVASCULAR:  Regular rate and rhythm.  No murmur.  ABDOMEN:  Soft.  No organomegaly.  EXTREMITIES:  No edema.  Hickman catheter site with stable mild erythema.  NEUROLOGIC:  Alert and oriented.  Motor strength 5/5.  Knee DTRs absent  symmetrically.  Finger-to-nose and rapid alternating movement intact.  Gait  normal.   HOSPITAL COURSE:  [name] was admitted to the oncology unit at [facility] October 28, 2005, to  proceed with the sixth dose of intrathecal  chemotherapy and the seventh cycle of systemic chemotherapy.  Admission lab  work showed hemoglobin 9.8, white count 20.7, absolute neutrophil count  18.5, platelet count 70,000.  Sodium 143, potassium 3.4, chloride 109,  bicarb 26, glucose 100, BUN 8, creatinine 0.8, bilirubin 0.5, alkaline  phosphatase 62, SGOT 12, SGPT 11, total protein 5.5, albumin 3.7, calcium  8.4. LDH 410.   Intravenous hydration was initiated with D5W with 2 ampules of sodium  bicarbonate per liter at 150 mL per hour.  He was also started on sodium  bicarbonate tablets (2) three times daily.  He completed the sixth dose of  intrathecal chemotherapy per interventional radiology on October 28, 2005.  He was premedicated with Decadron 20 mg IV, Zofran 16 mg IV and Ativan 2 mg  IV, followed by methotrexate 15 mg mixed with cytosine arabinoside 40 mg and  hydrocortisone 50 mg in a total volume of 10 mL preservative free saline via  lumbar puncture per interventional radiology.  The CSF cytology showed rare  lymphocytes.  White blood cell count on the CSF was normal at 2; red blood  cell count was elevated at 24 and total protein was 44 (normal 15-45).   On October 29, 2005, the seventh cycle of chemotherapy was initiated.  Dosages were based on a body surface area of 2 m2.  Cycle seven was given as  follows:  Decadron 10 mg IV every 12 hours for 5 days; Zofran 16 mg IV every  12 hours for 5 days; lorazepam 1 mg IV every 12 hours for 5 days;  vincristine 2 mg IV; followed by methotrexate (150 mg/m2) 300 mg IV over 30  minutes then methotrexate (1.35 g/m2) 2700 mg IV infusion over 23-1/2 hours;  Cytoxan (500 mg/m2) 1000 mg IV over 1 hour daily for two doses beginning  after completion of the methotrexate infusion; Adriamycin (50 mg/m2) 100 mg  IV x1 dose to be given after completion of the methotrexate infusion; leucovorin (50 mg/m2) 100 mg IV 36 hours after initiation of  the  methotrexate and then leucovorin (15 mg/m2) 30 mg IV every 6 hours until the  serum methotrexate level was less than 0.05 micromolars.  Urine pH and urine  output were monitored closely with adequate levels achieved.  The  methotrexate level returned at 0.05 on October 22, 2005, and at 0.02 on  November 02, 2005.  On November 01, 2005, [name] developed some early  mucositis.  Supportive care was provided.  He remained able to tolerate a  regular diet throughout the hospitalization.  Otherwise, he tolerated the  chemotherapy without significant acute toxicity.   [name] course prior to this admission was complicated by a coag-negative  staph bacteremia September 2007.  The IV vancomycin was continued throughout  this hospitalization.  He had completed approximately 12 days of  the  vancomycin at the time of discharge.  He remained afebrile throughout the  hospitalization.  The vancomycin will be continued through November 06, 2005,  at which time he will begin clindamycin 600 mg twice daily.   [name]'s antihypertensive regimen was adjusted during this  hospitalization.  The Catapres patch was discontinued.  Toprol XL was  increased to 100 mg daily and he was started on  triamterene/hydrochlorothiazide 37.5/12.5 mg daily.  Blood pressure  initially increased.  This was felt to likely be a rebound hypertension  related to discontinuation of the clonidine patch.  His blood pressure  subsequently normalized.  He will continue the Toprol XL and Maxzide at  discharge.   [name] has a history of a left lower extremity DVT.  He is maintained on  Lovenox injections.  The Lovenox will be continued throughout nadir/recovery  of counts at which time he will be transitioned to Coumadin.   On October 30, 2005, [name] reported some discomfort related to the  Hickman catheter.  He was evaluated by interventional radiology.  A suture  was removed utilizing sterile technique.  The  catheter cuff was intact and  no further suture was needed.   On November 02, 2005, [name] received the sixth of a planned six doses of  Rituxan.  The dose of the Rituxan was based on a body surface area of 2 m2.  He was premedicated with Tylenol 650 mg and Benadryl 50 mg followed by  Rituxan (375 mg/m2) 750 mg IV.  He tolerated the infusion well.   On November 03, 2005, [name] was felt to be stable for discharge home.  He  will be followed closely as an outpatient.  He will receive a Neulasta  injection in the office following his discharge home.   LABORATORY DATA:  November 03, 2005, hemoglobin 9.4, white count 26, platelet  count were 156,000.  Sodium 138, potassium 4.2, BUN 16, creatinine 0.8,  glucose 145, bilirubin 1.5, alkaline phosphatase 53, SGOT 37, SGPT 98, total  protein 5.4, albumin 3.1, calcium 9.6.   PROCEDURES:  1. Lumbar puncture for administration of intrathecal chemotherapy. 2. Chemotherapy/immunotherapy.   CONSULTATION:  Interventional radiology.   DISCHARGE DIAGNOSES:  1. Burkitt-like lymphoma versus adult acute lymphocytic leukemia day five,      cycle seven chemotherapy; Rituxan November 02, 2005.  2. Low grade mucositis secondary to chemotherapy.  3. Recent coag-negative staph bacteremia, day 12 of vancomycin.  4. Left lower extremity deep venous thrombosis maintained on Lovenox.  5. Hypertension controlled on Toprol and Maxzide.  6. History of methicillin resistant Staphylococcus aureus sepsis  7. History of transient renal failure, likely related to vancomycin,      resolved.   DISCHARGE DISPOSITION:  1. Condition:  Stable for discharge home with close outpatient follow-up.  2. Diet:  No restrictions.  3. Wound care:  Routine care of Hickman catheter.  4. Follow-up: Dr. Beryle Beams November 06, 2005.   DISCHARGE MEDICATIONS:  1. Wellbutrin XR 300 mg daily.  2. Toprol XL 100 mg daily.  3. Maxzide 37.5/25 one tablet daily.  4. Prilosec 20 mg daily.   5. Lovenox 140 mg subcu daily.  6. Vancomycin November 06, 2005.  7. Compazine 10 mg every 4 hours as needed for nausea.  8. Marinol 5 mg twice daily for appetite.  9. Artificial tears as needed.  10.Clindamycin 600 mg twice daily beginning Friday evening November 06, 2005.  11.Percocet 1-2 tablets  every 4 hours as needed for pain.  12.Discontinued clonidine patch.      Ned Card, N.P.      Alyson Locket. Beryle Beams, [name]  Electronically Signed    LT/MEDQ  D:  11/03/2005  T:  11/04/2005  Job:  [id number]   cc:   Tammy R. [name], [name]  Fax: [place]. Tomasa Hosteller, [name]  [facility]

## 2010-06-20 NOTE — Discharge Summary (Signed)
NAMEJOHNSE, ROMANO                ACCOUNT NO.:  [id number]   MEDICAL RECORD NO.:  [id number]          PATIENT TYPE:  INP   LOCATION:  18                         FACILITY:  [facility]   PHYSICIAN:  [name], M.D.DATE OF BIRTH:  11-28-1960   DATE OF ADMISSION:  09/20/2005  DATE OF DISCHARGE:  10/01/2005                                 DISCHARGE SUMMARY   REASON FOR HOSPITALIZATION:  Headaches, nausea and vomiting.   HISTORY OF PRESENT ILLNESS:  Mr. Bogacki is a 50 year old gentleman who was  diagnosed with Burkitt-like lymphoma/leukemia April2007 at which time he  presented with migratory pain in his back and extremities as well as  weakness, fatigue and fever.  He rapidly developed anemia and  thrombocytopenia and had initial liver function abnormalities.  Outpatient  evaluation for an infectious process was unrevealing with a normal hepatitis  profile and negative HIV.   He was admitted [id number], for further evaluation.  Review of the  peripheral blood smear showed greater than 30% blasts.  Bone marrow aspirate  and biopsy [id number], was 100% cellular with 80% blasts.  The blasts were  typical of those seen with Burkitt's lymphoma/leukemia though the chromosome  studies failed to reveal the typical 8;14 translocation.  He had a rapid  rise in the serum LDH to approximately 18,000 and rapid deterioration in his  liver function with development of  profound hypoglycemia and lactic  acidosis.  Emergency treatment was implemented.  Initial chemotherapy  consisted of a 5-day induction course with low-dose Cytoxan in addition to  steroids and vincristine.  He then received consolidation chemotherapy with  a combination of ifosfamide, high-dose methotrexate, Ara-C, etoposide,  additional vincristine and steroids.  He was also started on a program of  intrathecal chemotherapy with methotrexate, Ara-C and hydrocortisone.  To  date he has completed five of an anticipated seven  cycles of chemotherapy  and four doses of intrathecal chemotherapy.  Mr. Collingwood was discharged home  September 18, 2005, following completion of cycle #5.   His initial course was complicated by MRSA sepsis with source of infection  an acute posterior neck abscess; grade 2 mucositis; nasal soft tissue  cellulitis; scalp cellulitis; right inguinal Candida infection; acute left  lower extremity DVT; superficial right upper extremity thrombophlebitis;  renal dysfunction [id number], possibly vancomycin-related renal toxicity;  intermittent positional headaches of unclear etiology, possibly a low-grade  arachnoiditis with the dose of intrathecal methotrexate decreased to 10 mg  with cycle #4; accidental dislodgement of the Hickman catheter September 10, 2005, with subsequent replacement September 14, 2005.   Mr. Schramm presented on September 20, 2005, with worsening headaches,  nausea/vomiting and inability to tolerate any of his medications.  He was  subsequently admitted by Dr. Jana Hakim for hydration, nausea control and pain  control.   Admission vital signs showed temperature 97.4, heart rate 85, respirations  22, blood pressure 121/78.  Admission physical examination per Dr. Jana Hakim  was unremarkable.  There was no peripheral adenopathy.  Lungs clear to  auscultation.  Regular cardiac rhythm.  Abdomen was benign.  There was no  peripheral  edema.   HOSPITAL COURSE:  Mr. Kham was readmitted to the oncology unit at [facility] September 20, 2005.  Admission lab work showed hemoglobin 10.5;  white count 20.8; absolute neutrophil count 20.6; platelet count 173,000.  Sodium 137, potassium 4.2, chloride 99, CO2 28, glucose 138, BUN 29,  creatinine 1.2, bilirubin 1.6, alkaline phosphatase 55, SGOT 18, SGPT 25,  total protein 6.4, albumin 3.4, calcium 9.5, LDH 173.  Intravenous hydration  was initiated with D5-half normal saline at 100 mL per hour.  Zofran and  Reglan were given for the nausea.  Morphine  was administered for the  headaches.  By September 21, 2005, the headache had subsided.  Over the next 48  hours the nausea and vomiting significantly improved.  The nausea and  vomiting were felt to be a delayed reaction to the previous cycle of  chemotherapy.   At the time of admission on September 20, 2005, Mr. Mcguirt was at day #7 from the  fifth cycle of chemotherapy.  He had received Neulasta September 18, 2005.  On  September 23, 2005, blood counts were noted to be declining with a hemoglobin  of 8.5; white count 1.1; and platelet count of 85,000.  Prophylactic Cipro  500 mg orally twice daily was initiated.  He was also started on Diflucan  100 mg daily.  He remained afebrile.  Evidence of recovery of the white  count was noted on September 26, 2005, when it returned at 1.9.  Platelet count  that same day was 38,000.  Mr. Baney received doses of Neupogen 480 mcg on  September 25, 2005, and September 26, 2005.  Blood counts continued to show  evidence of recovery.  Transfusion support in the form of packed red blood  cells was provided on September 30, 2005, when the hemoglobin returned at 6.5.   Mr. Fetchko received the second dose of Rituxan (375 mg/sqm) 750 mg IV on  September 25, 2005.  A third dose was administered on October 01, 2005, prior to  his discharge home.   As Mr. Red's blood counts began to show evidence of recovery, the LDH began  to increase as well.  The LDH peaked at 551 on September 28, 2005.  Dr.  Beryle Beams was concerned for progressive disease despite the  aggressive/multidrug regimen he was receiving.  In view of this, Dr.  Beryle Beams began Decadron 10 mg twice daily for 5 days on September 27, 2005,  and vincristine 2 mg IV push on September 28, 2005.   On September 28, 2005, Dr. Beryle Beams performed a bone marrow aspirate biopsy.  The marrow was generally hypercellular for age with trilineage hematopoiesis but with predominance of granulocytic cells displaying left-shifted  maturation.  The  lateral finding was consistent with growth factor effect.  There was no morphologic evidence of a lymphoproliferative process.  Flow  cytometry of the lymphoid population showed exclusive presence of T-cells  with the absence of B-cells.  As such, there was no immunophenotypic  evidence of a lymphoproliferative process.  Dr. Beryle Beams discussed Mr.  Weldy's case with Dr. Tomasa Hosteller at [facility] and a  consultation was arranged for.  On September 30, 2005, Mr. Damasco was evaluated  by Dr. Tomasa Hosteller at [facility].  In view of the good bone marrow result, the  recommendation was to continue the current program.  Mr. Turner returned to  [facility] later that same day.   The serum LDH slowly declined, returning at a  level of 487 on a September 29, 2005; 399 on September 30, 2005; and 340 on October 01, 2005.  The etiology of  the initial elevation of the serum LDH remained unclear.  There were no  signs of organ infarction or hemolysis.   Mr. Demarchi will receive a third dose of Rituxan October 01, 2005, prior to  discharge home.  He will be readmitted on October 06, 2005, for the sixth  of a planned seven cycles of IV chemotherapy and the fifth of a planned six  cycles of intrathecal chemotherapy.   On October 01, 2005, Mr. Gan's blood pressure was elevated at 150/101.  Toprol-XL 50 mg daily, which had been placed on hold early in the admission  due to mild hypotension, was resumed.  Followup blood pressure returned at  137/91 later that same day.   Mr. Munzer has a history of an acute left lower extremity DVT and superficial  right upper extremity thrombophlebitis maintained on Lovenox.  The Lovenox  was continued throughout the hospitalization.   On October 01, 2005, Mr. Enerson be discharged home following completion of the  Rituxan infusion.  He will be readmitted on October 06, 2005, as outlined  above.   CONSULTATIONS:  Dr. Tomasa Hosteller at [facility].    PROCEDURES:  1. Chemotherapy/immunotherapy.  2. Blood transfusion.  3. Bone marrow aspirate and biopsy.   DISCHARGE DIAGNOSES:  1. Burkitt's lymphoma/leukemia.  2. Delayed nausea/vomiting secondary to chemotherapy, resolved.  3. Headaches, resolved.  4. Elevated serum LDH - unclear etiology; trending downward at discharge.  5. Hypertension.  6. Left lower extremity deep venous thrombosis/superficial      thrombophlebitis, maintained on Lovenox.  7. History of methicillin-resistant Staphylococcus aureus sepsis secondary      to a neck abscess, status post 62-month course of vancomycin.  8. Transient renal failure likely related to vancomycin, resolved.  9. Pancytopenia secondary to chemotherapy, improving.   LABORATORY DATA:  October 01, 2005:  Hemoglobin 8.8; white count 26; platelet  count 67,000.  LDH 340, sodium 141, potassium 3.7, chloride 109, CO2 26, glucose 146, BUN 14, creatinine 1.1, bilirubin 0.7, alkaline phosphatase 74,  SGOT 14, SGPT 14, total protein 5.4, albumin 3.0, calcium 9.1.   DISPOSITION AT DISCHARGE:  1. Condition:  Stable for discharge home following Rituxan infusion.  2. Diet:  No restrictions.  3. Activity:  Increase activity slowly.  4. Wound care:  Routine care of Hickman catheter.  5. Special instructions:  Plan for readmission to [facility] on      September 3 or October 06, 2005.  Call for fever greater than or equal      to 101 degrees or any other problems.   DISCHARGE MEDICATIONS:  1. Clonidine TTS-1 patch, change weekly.  2. Toprol-XL 50 mg daily.  3. Wellbutrin XL 300 mg daily.  4. Compazine 10 mg every 4 hours as needed for nausea.  5. Protonix 40 mg daily.  6. Discontinue hydrochlorothiazide.      Ned Card, N.P.      Alyson Locket. Beryle Beams, [name]  Electronically Signed    LT/MEDQ  D:  10/01/2005  T:  10/02/2005  Job:  [id number]   cc:   Donato Heinz, [name]  [facility]   Tammy R. [name], [name]  Fax:  [phone]

## 2010-06-20 NOTE — Discharge Summary (Signed)
[name], [name]                ACCOUNT NO.:  [id number]   MEDICAL RECORD NO.:  [id number]          PATIENT TYPE:  INP   LOCATION:  34                         FACILITY:  [facility]   PHYSICIAN:  [name], M.D.DATE OF BIRTH:  [birth year]   DATE OF ADMISSION:  09/14/2005  DATE OF DISCHARGE:  09/18/2005                                 DISCHARGE SUMMARY   REASON FOR HOSPITALIZATION:  Burkitt's lymphoma for chemotherapy.   HISTORY OF PRESENT ILLNESS:  [name] is a 50 year old gentleman who was  diagnosed with Burkitt's lymphoma/leukemia April2007 at which time he  presented with migratory pain in his back and extremities as well as  weakness, fatigue, and fever. He rapidly developed anemia and  thrombocytopenia. He had initial liver function abnormalities. Outpatient  evaluation for infectious process was unrevealing with a normal hepatitis  profile and negative HIV.   He was admitted to the hospital May25,2007 for further evaluation. Review of  the peripheral blood smear showed greater than 30% blasts. Bone marrow  aspirate and biopsy on May28,2007 was 100% cellular with over 80% blasts.  The blasts were typical of those seen with Burkitt's lymphoma/leukemia  though the chromosome studies failed to reveal the typical 814  translocation. He had a rapid rise in the serum LDH to 18,000 and rapid  deterioration in his liver function with development of a profound  hypoglycemia and lactic acidosis. Emergency treatment was required.  Initial  chemotherapy consisted of a five day induction course with low dose Cytoxan  in addition to steroids and vincristine. He then received consolidation  chemotherapy with a combination of ifosfamide, high dose methotrexate, Ara-  C, etoposide, additional vincristine, and steroids. He was also started on a  program of intrathecal chemotherapy with methotrexate, Ara-C, and  hydrocortisone. At the time of this admission he had completed four  cycles  of systemic chemotherapy and three doses of intrathecal chemotherapy.   Mr. Marrison course has been complicated by MRSA sepsis, source posterior neck  abscess; grade II mucositis; nasal soft tissue cellulitis; scalp cellulitis;  right inguinal Candida infection; acute left lower extremity DVT;  superficial right upper extremity thrombophlebitis. A PICC line was removed  due to MRSA sepsis. He developed renal dysfunction July2007, possibly  vancomycin related renal toxicity. During the previous admission he  developed intermittent positional headaches of unclear etiology. His Hickman  catheter was accidentally dislodged/removed following most recent cycle of  chemotherapy.   Mr. [name] presented on August13,2007 for elective admission to proceed with  the fifth of an anticipated seven cycles of chemotherapy.   ADMISSION PHYSICAL EXAMINATION:  VITAL SIGNS:  Showed temperature 97.1,  heart rate 76, respirations 18, blood pressure 124/82, weight 208.1 pounds.  GENERAL:  Showed a pleasant Caucasian male in no acute distress.  HEENT:  Alopecia. Pupils equal, round, reactive to light. Extraocular  movements intact. Vessels normal. Sclerae anicteric. Oropharynx without  thrush or ulceration.  CHEST/LUNGS:  Clear bilaterally. Breath sounds were diminished slightly at  the right base.  CARDIOVASCULAR:  Regular rate and rhythm.  ABDOMEN:  Soft. No organomegaly.  EXTREMITIES:  No  edema.  NEUROLOGICAL:  Alert and oriented. Motor strength 5/5. DTRs absent  symmetrically. Finger-to-nose and rapid alternating movement intact. There  was mild decrease in vibratory sense over the finger tip. Gait was normal.   HOSPITAL COURSE:  [name] was admitted to the oncology unit at [facility] on September 14, 2005 to proceed with the fifth cycle of chemotherapy.  Admission lab work showed hemoglobin 12.2, white count 8.7, absolute  neutrophil count 5.8, platelet count 470,000. Sodium 138,  potassium 3.7, BUN  13, creatinine 1.4, glucose 129. Bilirubin 1.2, alkaline phosphatase 54.  SGOT 24, SGPT 23. Total protein 7.3. Albumin 4.2. Calcium 10.   On September 14, 2005 [name] received the fourth cycle of intrathecal  chemotherapy consisting of  methotrexate 10 mg,Ara-C 40 mg, and  hydrocortisone 50 mg mixed together in a 10 cc total volume of preservative  free saline for injection and administration via intrathecal route per  interventional radiology. Of note, the methotrexate dose was decreased to 10  mg with this cycle due to concerns that the intermittent positional  headaches were possibly due to a low grade arachnoiditis related to the  chemotherapy as previously administered. Cytology on the cerebrospinal fluid  showed rare lymphocytes. No malignant cells were identified. The CSF white  blood cell count was 1, red blood cell count 3, and total protein 42 (normal  15-45). Mr. [name] also underwent placement with a Hickman catheter on September 14, 2005 per interventional radiology.   Intravenous hydration with D5W with 2 ampules of sodium bicarbonate per  liter was initiated upon admission at 150 cc per hour. Sodium bicarbonate  tablets were administered three times daily. The IV fluid rate was increased  to 250 cc per hour for four hours prior to beginning the methotrexate on  September 15, 2005.   Chemotherapy doses were based on a height of 6 feet 2 inches, weight 208.1  pounds, and body surface area 2 meters squared. Beginning on September 15, 2005  following the prehydration [name] received vincristine 2 mg IV push;  methotrexate (150 mg/sq m), 300 mg IV over 30 minutes; methotrexate (1.35  g/sq m), 2700 mg IV infusion over 23.5 hours; Cytoxan (500 mg/sq m) 1000 mg  IV daily x2 doses after completion of the methotrexate infusion; Adriamycin  (50 mg/sq m) 100 mg IV x1 after completion of the methotrexate infusion; leucovorin (50 mg/sq m) 100 mg IV was administered 36 hours  after initiation  of the methotrexate. Then leucovorin (15 mg/sq m) 30 mg IV was administered  every six hours. The methotrexate level on September 18, 2005 returned at 0.2.  Adequate urine pH and urine output were maintained throughout the  hospitalization. He will be discharged home on leucovorin 5 mg two tablets  every six hours for 48 hours.   Rituxan was added to the regimen with this cycle. On September 17, 2005 Mr.  Kaley received Rituxan (375 mg/sq m) 750 mg IV over six hours. He tolerated  the infusion well.   On September 18, 2005 Mr. Valley was felt to be stable for discharge home. He  will continue the leucovorin for an additional 48 hours as outlined above.  He will receive a Neulasta injection at the cancer center following his  discharge September 18, 2005.   CONSULTATIONS:  Interventional radiology.   PROCEDURES:  1. Chemotherapy/immunotherapy.  2. Intrathecal chemotherapy.  3. Hickman catheter placement.   DISCHARGE DIAGNOSES:  1. Burkitt's lymphoma/leukemia status post cycle 5  of 7 of chemotherapy.  2. Hypertension.  3. Left lower extremity deep venous thrombosis maintained on Lovenox.  4. Recent methicillin-resistant Staphylococcus aureus sepsis secondary to      a neck abscess status post one month course of vancomycin.  5. Recent transient renal failure.   LABORATORY DATA:  September 18, 2005, hemoglobin 9, white count 12.6, platelet  count 212,000. Sodium 139, potassium 3.8, chloride 101, CO2 31, glucose 118,  BUN 15, creatinine 1.2. Bilirubin 0.9, alkaline phosphatase 40. SGOT 21,  SGPT 23. Total protein 5.3. Albumin 2.8. Calcium 8.9. Methotrexate level  0.2.   DISPOSITION AT DISCHARGE:  1. Condition - stable for discharge home.  2. Diet no restrictions.  3. Activity - increase activity slowly.  4. Wound care - routine Hickman catheter care.  5. Follow-up. Dr. Beryle Beams September 21, 2005.   SPECIAL INSTRUCTIONS:  Call immediately for fever 101 or greater. Come to   cancer center following discharge today for a Neulasta injection.   DISCHARGE MEDICATIONS:  1. Leucovorin 5 mg two tablets every six hours for 48 hours.  2. Wellbutrin XL 300 mg daily.  3. Toprol XL 100 mg daily.  4. Hydrochlorothiazide 50 mg daily.  5. Clonidine TTS one patch, change weekly.  6. Compazine 10 mg every four hours as needed for nausea.  7. Protonix 40 mg daily.  8. Viscous lidocaine 2%, gargle as needed for throat pain.  9. Lovenox 140 mg subcu daily.  10.Mycelex troches, dissolve in mouth three times daily.  11.Percocet 5/325 1-2 tablets every four hours as needed for mouth pain.      Ned Card, N.P.      Alyson Locket. Beryle Beams, [name]  Electronically Signed    LT/MEDQ  D:  09/21/2005  T:  09/21/2005  Job:  [id number]   cc:   Tammy R. [name], [name]  Fax: [phone]

## 2010-06-20 NOTE — Discharge Summary (Signed)
NAMEPOSIE, JEFCOAT                ACCOUNT NO.:  [id number]   MEDICAL RECORD NO.:  [id number]          PATIENT TYPE:  INP   LOCATION:  [place]:  [facility]   PHYSICIAN:  [name], M.D.DATE OF BIRTH:  [birth year]   DATE OF ADMISSION:  06/26/2005  DATE OF DISCHARGE:  07/30/2005                                 DISCHARGE SUMMARY   Mr. Kyne is a pleasant 50 year old man who has been in overall excellent  health.  He presented with the insidious onset of low back pain four weeks  prior to the current admission.  He then began to have migratory bone pain.  Low-grade fevers up to 100 degrees.  Progressive weakness and fatigue.  Outpatient evaluation including an MRI of the spine and brain as well as a  CT scan of the abdomen and pelvis showed no gross abnormalities.  Blood  chemistries showed elevated liver function tests.  Viral studies were done  as well as a Lyme disease titer.  The Lyme titer was initially reported as  positive and he was started on doxycycline two days prior to the admission.  He was also started on some prednisone.   Dictation ended at this point.   Dictation terminated due to equipment failure.  Full summary redicatated.   Annye Rusk MD      Alyson Locket. Beryle Beams, [name]  Electronically Signed     JMG/MEDQ  D:  07/30/2005  T:  07/30/2005  Job:  [id number]

## 2010-06-20 NOTE — H&P (Signed)
[name], [name]                ACCOUNT NO.:  [id number]   MEDICAL RECORD NO.:  [id number]          PATIENT TYPE:  INP   LOCATION:  [place]                         FACILITY:  [facility]   PHYSICIAN:  [name], M.D.DATE OF BIRTH:  [birth year]   DATE OF ADMISSION:  10/28/2005  DATE OF DISCHARGE:                                HISTORY & PHYSICAL   REASON FOR HOSPITALIZATION:  Burkitt-like lymphoma versus adult ALL, to  proceed with the seventh of a planned seven cycles of chemotherapy.   HISTORY OF PRESENT ILLNESS:  [name] is a 50 year old gentleman who  presented April 2007 with migratory pain in his back and extremities as well  as weakness, fatigue and fever.  He rapidly developed anemia and  thrombocytopenia.  He had initial liver function abnormalities.  Outpatient  evaluation for an infectious process was unrevealing with a normal hepatitis  profile and negative HIV.  Initial Lyme disease titer was elevated and he  was started on a course of tetracycline.   [name] was admitted to the hospital Jun 26, 2005 due to progressive pain  and laboratory abnormalities.  Review of the peripheral blood smear showed  greater than 30% blasts.  Bone marrow aspirate/biopsy Jun 29, 2005 was 100%  cellular with over 80% blasts.  The blasts were typical of those seen with  Burkitt's lymphoma/leukemia, although chromosome studies failed to reveal  the typical 8-14 translocation.  Serum LDH rapidly rose to 18,000 and he had  rapid deterioration in his liver function with development of profound  hypoglycemia and lactic acidosis.  Emergency treatment was initiated.  Initial chemotherapy consisted of a 5-day induction course with low-dose  Cytoxan, steroids and vincristine.  He then received consolidation  chemotherapy with a combination of chemotherapy drugs including Ifosfamide,  high-dose methotrexate, Ara-C, etoposide, additional vincristine and  steroids.  He also began a program of  intrathecal chemotherapy with  methotrexate, Ara-C and hydrocortisone.  At the time of this admission Mr.  [name] has completed six of a planned seven cycles of chemotherapy.   His course has been complicated by MRSA sepsis with a posterior neck abscess  of; nasal soft tissue cellulitis; scalp cellulitis; right inguinal candida  infection; acute left lower extremity DVT and superficial right upper  extremity thrombophlebitis now maintained on Lovenox; renal dysfunction July  2007, possibly vancomycin related renal toxicity with subsequent  normalization of renal function; accidental dislodgement/removal of Hickman  catheter August 2007 with subsequent replacement; and most recently coag-  negative staph bacteremia September 2007 currently on IV vancomycin with a 1-  month course planned.   [name] presents today for elective admission to proceed with the seventh  cycle of chemotherapy.  He will receive the sixth dose of intrathecal  chemotherapy with methotrexate, Ara-C and hydrocortisone as well.   PAST MEDICAL HISTORY:  1. Burkitt-like lymphoma versus adult ALL.  2. Hypertension.  3. Hyperlipidemia.  4. Depression.  5. Hemorrhoids.  6. MRSA sepsis June 2007 with a posterior neck abscess status post 76-month      course of vancomycin.  7. Acute left lower  extremity DVT and superficial right upper extremity      thrombophlebitis maintained on Lovenox.  8. History of renal dysfunction, possibly vancomycin related.  9. Coag-negative staph sepsis September 2007, currently on a 26-month      course of IV vancomycin.   MEDICATIONS:  1. Wellbutrin XL 300 mg daily.  2. Clonidine TTS patch changed weekly.  3. Toprol XL 50 mg daily.  4. Prilosec 20 mg daily.  5. Lovenox 140 mg subcu daily.   ALLERGIES:  QUESTION VANCOMYCIN WITH POSSIBLE RENAL TOXICITY.   FAMILY HISTORY:  Noncontributory.   SOCIAL HISTORY:  [name] is married.  He lives in [place].  He does not  have children.   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  No alcohol or tobacco use.   REVIEW OF SYSTEMS:  Mr. [name] reports that he feels well.  He denies any  fever or sweats.  Oral intake is good.  He had a headache earlier today  which he attributes to allergies.  He has also had some nasal congestion.  Energy level is good.  No shortness of breath or cough.  He denies any  bleeding.  No blurred or double vision.  No nausea, vomiting or diarrhea.  No hematuria or dysuria.  No neuropathy symptoms.   PHYSICAL EXAM:  VITAL SIGNS:  Temperature 97.5, heart rate 73,respirations  18, blood pressure 157/96, weight 218.5 pounds.  GENERAL:  A pleasant Caucasian male in no acute distress.  HEENT:  Normocephalic, atraumatic.  Alopecia.  Pupils equal, reactive to  light.  Extraocular movements intact.  Vessels normal.  Oropharynx is  without thrush.  Buccal regions mildly erythematous.  No ulcerations.  CHEST/LUNGS:  Clear bilaterally.  Breath sounds slightly diminished at the  right lung base.  No wheezes or rales.  CARDIOVASCULAR:  Regular rate and rhythm.  No murmur.  ABDOMEN:  Soft.  No organomegaly.  EXTREMITIES:  No edema.  Hickman catheter site was stable, mild erythema.  NEUROLOGIC:  Alert and oriented.  Motor strength 5/5.  Knee DTR's absent at  the knees  symmetrically.  Finger-to-nose and rapid alternating movement  intact.  Gait normal.   LABORATORY DATA:  Hemoglobin 9.8, white count 20.7, absolute neutrophil  count 18.5, platelet count 70,000 sodium of 43, potassium 3.4, chloride 109,  CO2 26, glucose 100, BUN 8, creatinine 0.18, bilirubin 0.5, alkaline  phosphatase 62, SGOT 12, SGPT 11, total protein 5.5, albumin 3.7, calcium  8.4, LDH 410.   IMPRESSION:  1. Burkitt-like lymphoma versus adult ALL.  2. Coag-negative staph sepsis September 2007 currently on a 88-month course     of vancomycin, day seven.  3. History of MRSA sepsis/posterior  neck abscess June 2007.  4. Acute left lower extremity DVT and right upper extremity      thrombophlebitis maintained on Lovenox.  5. Hypertension.  6. History of renal failure, possibly vancomycin related.   PLAN:  Mr. Miraglia is stable to proceed with the 7th cycle of chemotherapy and  the sixth cycle of intrathecal chemotherapy.  Chemotherapy dosages will be  based on a body surface area of 2 m2.  He will receive intrathecal chemo  therapy with methotrexate, Ara-C and hydrocortisone today.  The systemic  chemotherapy will begin on October 29, 2005 as follows:  He will be  premedicated with Decadron 10 mg IV every 12 hours for 5 days, Zofran 16 mg  IV every 12 hours for 5  days, lorazepam 1 mg IV every 12 hours for a 5 days;  methotrexate (150 mg/m2) 300 mg IV over 30 minutes then methotrexate (1.35  grams per meter squared) 2700 mg IV infusion over 23.5 hours; vincristine 2  mg IV to be given before they bolus dose of methotrexate; Cytoxan (500  mg/m2) 1000 mg IV over 1 hour daily for two doses to began after completion  of the methotrexate infusion; Adriamycin (50 mg/m2) 100 mg IV x1 to begin  after completion of the methotrexate infusion; leucovorin (50 mg/m2) 100 mg  IV 36 hours after initiation of methotrexate then leucovorin (15 mg/m2) 30  mg IV every 6 hours until the serum methotrexate level is less than 0.05  micromolar's.  Daily stat methotrexate levels will begin on 10/31/2005.   The patient interviewed and examined by Dr. Beryle Beams; plan reviewed.      Ned Card, N.P.      Alyson Locket. Beryle Beams, [name]  Electronically Signed    LT/MEDQ  D:  10/29/2005  T:  10/30/2005  Job:  [id number]   cc:   Tammy R. [name], [name]  Fax: [phone]   Lorrin Jackson, [name]  [facility]

## 2010-06-20 NOTE — H&P (Signed)
NAMESQUIRE, LOSER                ACCOUNT NO.:  [id number]   MEDICAL RECORD NO.:  [id number]          PATIENT TYPE:  INP   LOCATION:  [place]:  [facility]   PHYSICIAN:  [name], M.D.DATE OF BIRTH:  November 16, 1960   DATE OF ADMISSION:  08/03/2005  DATE OF DISCHARGE:                                HISTORY & PHYSICAL   This is an elective admission to continue chemotherapy for this 50 year old  man recently diagnosed with Burkitt's lymphoma/leukemia.  He presented in  late April 2007 with the insidious onset of migratory pain in his back and  extremities, weakness, fatigue, fever.  He rapidly developed anemia and  thrombocytopenia.  He had initial liver function abnormalities.  Outpatient  evaluation for infectious process was unrevealing with a normal hepatitis  profile and negative HIV.  An initial Lyme disease titer was elevated and he  was started on tetracycline.  Due to his progressive pain and change in his  blood profile, he was admitted on Jun 26, 2005, for further evaluation.  Please see my extensive discharge summary from July 30, 2005, for full  details of that admission.  Briefly, review of the peripheral blood film  showed over 30% blasts.  A bone marrow aspiration and biopsy was 100%  cellular with over 80% blasts.  The blasts were typical of those seen with  Burkitt's' lymphoma/leukemia, although chromosome studies failed to reveal  the typical 8:14 translocation.  He had extremely aggressive disease with a  rapid rise in his serum LDH up to 18,000; rapid deterioration in his liver  functions with development of a profound hypoglycemia and lactic acidosis.  He required emergency treatment.  Initial chemotherapy was a 5-day induction  course with low-dose Cytoxan in addition to steroids and vincristine.  He  was then consolidated with a combination of chemotherapy drugs including  ifosfamide, high-dose methotrexate, ara-C, etoposide,  additional vincristine  and steroids.  He also began a program of intrathecal chemotherapy with  methotrexate, ara-C and hydrocortisone, and received two doses during recent  hospitalization.   He started his third chemotherapy treatment last Wednesday and received  Cytoxan 500 mg/sqm daily x2, Adriamycin 50 mg/sqm x1 on day #2, vincristine  2 mg IV day #1, and Decadron 10 mg twice daily x5 days.  He is admitted at  this time to complete this cycle with high-dose methotrexate with leucovorin  rescue.   Recent hospital course complicated by development MRSA sepsis with likely  source a neck abscess that developed when his blood counts were low.  He  also had a cellulitis of the nasal cartilage around the tip of his nose.  MRSA also grew out of his blood.  He has been on vancomycin now for about 2  weeks and will continue for at least another 2 weeks.   PAST MEDICAL HISTORY:  1.  Hypertension.  2.  Hyperlipidemia.  3.  Depression.  4.  hemorrhoids   No major surgical procedures.  He had a PIC catheter placed for the initial  chemotherapy.  This was removed last week as a potential source  of  infection.  When he was afebrile for 48 hours a Hickman-type catheter was  placed in the left subclavian position with the assistance of interventional  radiology.   CURRENT MEDICATIONS:  Include:  1.  Clonidine TTS-1 one patch weekly.  2.  Wellbutrin XL 300 mg daily/  3.  Claritin 10 mg daily/  4.  Compazine 10 mg q.4h. p.r.n. nausea or vomiting.   I forgot to mention that he developed steroid-induced diabetes requiring  p.r.n. insulin during recent hospitalization.  He also developed transient  hemorrhoidal bleeding when his platelet count was down.   PAST MEDICAL HISTORY, FAMILY HISTORY, SOCIAL HISTORY:  See recent H&P.   REVIEW OF SYSTEMS:  Appetite has returned and he has started eating again.  No dyspnea, no chest pain, no palpitations.  He has some soreness.  No  abdominal pain.   No problem with his bowel movements.  No recurrence of  hemorrhoidal bleeding.  He has developed some leg cramps.  No dysuria or  frequency.   PHYSICAL EXAMINATION:  GENERAL:  Middle-aged man who is 6'2 tall, 215  pounds, body surface area 2.35 sqm.  VITAL SIGNS:  Blood pressure 148/84, pulse 82 regular, respirations 22,  temperature 96.8, oxygen saturation 99% on room air.  HEENT:  There is total alopecia from recent chemotherapy. Absess on back of  neck healing rapidly.  Cellulitis on the tip of the nose has resolved.  Pupils are equal, reactive to light.  Pharynx:  No erythema, exudate or  ulcer.  NECK:  Supple.  No thyromegaly.  Carotids 2+.  LUNGS:  Clear and resonant to percussion.  HEART:  Regular cardiac rhythm.  No murmur.  No gallop, no rub.   Labs pending.   IMPRESSION:  Burkitt's lymphoma/leukemia.  Plan is to proceed with the  remainder of cycle #3 with  Methotrexate 150 mg/sqm, total dose 350 mg IV  over 30 minutes followed by a continuous infusion of methotrexate 1.35 g,  total dose 3170 mg over 23.5 hours.  Beginning 36 hours after the initiation  of methotrexate, he will begin leucovorin rescue, first dose 50 mg/sqm,  total dose 120 mg, followed by 15 mg/sqm equals 35 mg IV q.6h. until serum  methotrexate levels are undetectable.  He will be given vigorous alkaline  hydration.  1.  MRSA sepsis/neck abscess: continue vancomycin to complete a 4 week      course.  2.  Hypertension  3.  Depression  4.  Recent nasal soft tissue cellulitis.  5.  Recent hemorhooidal bleeding.      Alyson Locket. Beryle Beams, [name]  Electronically Signed     JMG/MEDQ  D:  08/03/2005  T:  08/03/2005  Job:  [id number]   cc:   Tammy R. [name], [name]  Fax: [phone]

## 2010-06-23 ENCOUNTER — Other Ambulatory Visit: Payer: Self-pay | Admitting: Oncology

## 2010-06-23 ENCOUNTER — Encounter (HOSPITAL_BASED_OUTPATIENT_CLINIC_OR_DEPARTMENT_OTHER): Payer: 59 | Admitting: Oncology

## 2010-06-23 DIAGNOSIS — C8379 Burkitt lymphoma, extranodal and solid organ sites: Secondary | ICD-10-CM

## 2010-06-23 LAB — COMPREHENSIVE METABOLIC PANEL
ALT: 30 U/L (ref 0–53)
AST: 34 U/L (ref 0–37)
Calcium: 9.5 mg/dL (ref 8.4–10.5)
Chloride: 104 mEq/L (ref 96–112)
Creatinine, Ser: 0.96 mg/dL (ref 0.40–1.50)
Potassium: 3.7 mEq/L (ref 3.5–5.3)

## 2010-06-23 LAB — MORPHOLOGY: PLT EST: ADEQUATE

## 2010-06-23 LAB — CBC WITH DIFFERENTIAL/PLATELET
Basophils Absolute: 0 10*3/uL (ref 0.0–0.1)
Eosinophils Absolute: 0 10*3/uL (ref 0.0–0.5)
HGB: 16.4 g/dL (ref 13.0–17.1)
LYMPH%: 16.8 % (ref 14.0–49.0)
MCV: 91 fL (ref 79.3–98.0)
MONO%: 4.9 % (ref 0.0–14.0)
NEUT#: 6.1 10*3/uL (ref 1.5–6.5)
Platelets: 140 10*3/uL (ref 140–400)

## 2010-09-15 ENCOUNTER — Other Ambulatory Visit: Payer: Self-pay | Admitting: Oncology

## 2010-09-15 ENCOUNTER — Ambulatory Visit (HOSPITAL_COMMUNITY)
Admission: RE | Admit: 2010-09-15 | Discharge: 2010-09-15 | Disposition: A | Discharge status: Home / Self Care | Payer: 59 | Source: Ambulatory Visit | Attending: Oncology | Admitting: Oncology

## 2010-09-15 ENCOUNTER — Encounter (HOSPITAL_BASED_OUTPATIENT_CLINIC_OR_DEPARTMENT_OTHER): Payer: 59 | Admitting: Oncology

## 2010-09-15 DIAGNOSIS — C8379 Burkitt lymphoma, extranodal and solid organ sites: Secondary | ICD-10-CM

## 2010-09-15 DIAGNOSIS — Z09 Encounter for follow-up examination after completed treatment for conditions other than malignant neoplasm: Secondary | ICD-10-CM

## 2010-09-15 DIAGNOSIS — C8589 Other specified types of non-Hodgkin lymphoma, extranodal and solid organ sites: Secondary | ICD-10-CM | POA: Insufficient documentation

## 2010-09-15 LAB — COMPREHENSIVE METABOLIC PANEL
ALT: 29 U/L (ref 0–53)
AST: 26 U/L (ref 0–37)
Albumin: 4.2 g/dL (ref 3.5–5.2)
CO2: 27 mEq/L (ref 19–32)
Calcium: 9.8 mg/dL (ref 8.4–10.5)
Chloride: 104 mEq/L (ref 96–112)
Potassium: 3.9 mEq/L (ref 3.5–5.3)
Sodium: 141 mEq/L (ref 135–145)
Total Protein: 6.9 g/dL (ref 6.0–8.3)

## 2010-09-15 LAB — CBC WITH DIFFERENTIAL/PLATELET
Basophils Absolute: 0 10*3/uL (ref 0.0–0.1)
EOS%: 0.4 % (ref 0.0–7.0)
HGB: 17.6 g/dL — ABNORMAL HIGH (ref 13.0–17.1)
LYMPH%: 18.5 % (ref 14.0–49.0)
MCH: 31.3 pg (ref 27.2–33.4)
MCV: 90.6 fL (ref 79.3–98.0)
MONO%: 2.6 % (ref 0.0–14.0)
NEUT%: 78.2 % — ABNORMAL HIGH (ref 39.0–75.0)
Platelets: 136 10*3/uL — ABNORMAL LOW (ref 140–400)
RDW: 15.5 % — ABNORMAL HIGH (ref 11.0–14.6)

## 2010-09-15 LAB — LACTATE DEHYDROGENASE: LDH: 202 U/L (ref 94–250)

## 2010-09-15 LAB — MORPHOLOGY

## 2010-09-22 ENCOUNTER — Encounter (HOSPITAL_BASED_OUTPATIENT_CLINIC_OR_DEPARTMENT_OTHER): Payer: 59 | Admitting: Oncology

## 2010-09-22 DIAGNOSIS — C8379 Burkitt lymphoma, extranodal and solid organ sites: Secondary | ICD-10-CM

## 2010-12-20 ENCOUNTER — Telehealth: Payer: Self-pay | Admitting: Oncology

## 2010-12-20 NOTE — Telephone Encounter (Signed)
Lvm advising next appt 02/23/11 @ 2.45pm.

## 2011-02-23 ENCOUNTER — Telehealth: Payer: Self-pay | Admitting: Oncology

## 2011-02-23 ENCOUNTER — Ambulatory Visit (HOSPITAL_BASED_OUTPATIENT_CLINIC_OR_DEPARTMENT_OTHER): Payer: 59 | Admitting: Nurse Practitioner

## 2011-02-23 ENCOUNTER — Other Ambulatory Visit (HOSPITAL_BASED_OUTPATIENT_CLINIC_OR_DEPARTMENT_OTHER): Payer: 59 | Admitting: Lab

## 2011-02-23 VITALS — BP 144/97 | HR 86 | Temp 98.6°F | Ht 74.0 in | Wt 245.6 lb

## 2011-02-23 DIAGNOSIS — D696 Thrombocytopenia, unspecified: Secondary | ICD-10-CM

## 2011-02-23 DIAGNOSIS — Z9221 Personal history of antineoplastic chemotherapy: Secondary | ICD-10-CM

## 2011-02-23 DIAGNOSIS — C8379 Burkitt lymphoma, extranodal and solid organ sites: Secondary | ICD-10-CM

## 2011-02-23 DIAGNOSIS — I10 Essential (primary) hypertension: Secondary | ICD-10-CM

## 2011-02-23 DIAGNOSIS — C8370 Burkitt lymphoma, unspecified site: Secondary | ICD-10-CM

## 2011-02-23 LAB — CBC WITH DIFFERENTIAL/PLATELET
BASO%: 0.1 % (ref 0.0–2.0)
HCT: 51.1 % — ABNORMAL HIGH (ref 38.4–49.9)
MCHC: 34.4 g/dL (ref 32.0–36.0)
MONO#: 0.4 10*3/uL (ref 0.1–0.9)
NEUT#: 4.9 10*3/uL (ref 1.5–6.5)
RBC: 5.48 10*6/uL (ref 4.20–5.82)
WBC: 6.9 10*3/uL (ref 4.0–10.3)
lymph#: 1.6 10*3/uL (ref 0.9–3.3)

## 2011-02-23 LAB — MORPHOLOGY: PLT EST: DECREASED

## 2011-02-23 NOTE — Progress Notes (Signed)
OFFICE PROGRESS NOTE  Interval history:  [name] is a 51 year old man diagnosed with Burkitt's lymphoma in May 2007. He is seen today for routine followup.   [name] reports that he feels well. No interim illnesses or infections. He denies fevers or sweats. He has a good appetite. No weight loss. He denies nausea or vomiting. Bowels moving regularly. No shortness of breath or cough. He had an episode of "acid reflux" several nights ago. Over the past 6 months he has intermittently noted some short-term memory issues.   Objective: Blood pressure 144/97, pulse 86, temperature 98.6 F (37 C), temperature source Oral, height 6\' 2"  (1.88 m), weight 245 lb 9.6 oz (111.403 kg).  Oropharynx is without thrush or ulceration. No palpable cervical, supraclavicular, axillary or inguinal lymph nodes. Lungs are clear. No wheezes or rales. Regular cardiac rhythm. Abdomen soft and nontender. No organomegaly. Extremities are without edema. Alert and oriented. Memory appears intact.  Lab Results: Lab Results  Component Value Date   WBC 6.9 02/23/2011   HGB 17.6* 02/23/2011   HCT 51.1* 02/23/2011   MCV 93.4 02/23/2011   PLT 131* 02/23/2011    Chemistry:    Chemistry      Component Value Date/Time   NA 141 09/15/2010 1523   K 3.9 09/15/2010 1523   CL 104 09/15/2010 1523   CO2 27 09/15/2010 1523   BUN 13 09/15/2010 1523   CREATININE 1.04 09/15/2010 1523      Component Value Date/Time   CALCIUM 9.8 09/15/2010 1523   ALKPHOS 38* 09/15/2010 1523   AST 26 09/15/2010 1523   ALT 29 09/15/2010 1523   BILITOT 1.0 09/15/2010 1523       Studies/Results: No results found.  Medications: I have reviewed the patient's current medications.  Assessment/Plan:  1. Burkitt's lymphoma presenting in May 2007 with rapidly progressive pancytopenia, constitutional symptoms, progressive low back pain and migratory bone pain, low grade fever, hyperuricemia, and marked elevation of serum LDH to over 5000.  Bone marrow biopsy  was 100% cellular with 93% immature cells with light microscopy and clinical features all compatible with Burkitt's lymphoma, but cytogenetic studies did not show the classic 8;14 translocation. He was treated on an aggressive protocol modeled after the cancer and leukemia group B with induction Cytoxan, vincristine, and prednisone, and then alternating cycles of ifosfamide, methotrexate, vincristine, Ara-C, and  etoposide with Cytoxan, Adriamycin, and vincristine.  Intrathecal chemotherapy with methotrexate and hydrocortisone and cranial radiation.  He achieved a complete remission.  2. Elevated hemoglobin/hematocrit and mild thrombocytopenia. Stable. Question effect of testosterone on the bone marrow. Erythropoietin level and JAK 2 analysis pending. 3. Essential hypertension.  He continues lisinopril. 4. Recent intermittent short-term memory deficit. He will discuss further with his primary care provider.  Disposition-[name] appears stable. He remains in remission from the Burkitt's lymphoma. He will return for labs, chest x-ray and a followup visit in 6 months. He will contact the office the interim with any problems.  Ned Card ANP/GNP-BC

## 2011-02-23 NOTE — Telephone Encounter (Signed)
appt made and printed for 7/26,aom

## 2011-08-27 ENCOUNTER — Ambulatory Visit (HOSPITAL_COMMUNITY)
Admission: RE | Admit: 2011-08-27 | Discharge: 2011-08-27 | Disposition: A | Discharge status: Home / Self Care | Payer: 59 | Source: Ambulatory Visit | Attending: Nurse Practitioner | Admitting: Nurse Practitioner

## 2011-08-27 DIAGNOSIS — I10 Essential (primary) hypertension: Secondary | ICD-10-CM | POA: Insufficient documentation

## 2011-08-27 DIAGNOSIS — C8379 Burkitt lymphoma, extranodal and solid organ sites: Secondary | ICD-10-CM | POA: Insufficient documentation

## 2011-08-27 DIAGNOSIS — C8370 Burkitt lymphoma, unspecified site: Secondary | ICD-10-CM

## 2011-08-28 ENCOUNTER — Ambulatory Visit (HOSPITAL_BASED_OUTPATIENT_CLINIC_OR_DEPARTMENT_OTHER): Payer: 59 | Admitting: Oncology

## 2011-08-28 ENCOUNTER — Other Ambulatory Visit (HOSPITAL_BASED_OUTPATIENT_CLINIC_OR_DEPARTMENT_OTHER): Payer: 59 | Admitting: Lab

## 2011-08-28 ENCOUNTER — Telehealth: Payer: Self-pay | Admitting: Oncology

## 2011-08-28 VITALS — BP 152/93 | HR 92 | Temp 97.0°F | Ht 74.0 in | Wt 230.9 lb

## 2011-08-28 DIAGNOSIS — C8379 Burkitt lymphoma, extranodal and solid organ sites: Secondary | ICD-10-CM

## 2011-08-28 DIAGNOSIS — D751 Secondary polycythemia: Secondary | ICD-10-CM

## 2011-08-28 DIAGNOSIS — D45 Polycythemia vera: Secondary | ICD-10-CM

## 2011-08-28 DIAGNOSIS — R945 Abnormal results of liver function studies: Secondary | ICD-10-CM

## 2011-08-28 DIAGNOSIS — I10 Essential (primary) hypertension: Secondary | ICD-10-CM

## 2011-08-28 DIAGNOSIS — C8370 Burkitt lymphoma, unspecified site: Secondary | ICD-10-CM

## 2011-08-28 DIAGNOSIS — R599 Enlarged lymph nodes, unspecified: Secondary | ICD-10-CM

## 2011-08-28 LAB — CBC WITH DIFFERENTIAL/PLATELET
Basophils Absolute: 0 10*3/uL (ref 0.0–0.1)
Eosinophils Absolute: 0.1 10*3/uL (ref 0.0–0.5)
HCT: 53.3 % — ABNORMAL HIGH (ref 38.4–49.9)
LYMPH%: 10.6 % — ABNORMAL LOW (ref 14.0–49.0)
MCV: 94.5 fL (ref 79.3–98.0)
MONO%: 5.5 % (ref 0.0–14.0)
NEUT#: 7.8 10*3/uL — ABNORMAL HIGH (ref 1.5–6.5)
NEUT%: 82.8 % — ABNORMAL HIGH (ref 39.0–75.0)
Platelets: 132 10*3/uL — ABNORMAL LOW (ref 140–400)
RBC: 5.64 10*6/uL (ref 4.20–5.82)

## 2011-08-28 LAB — COMPREHENSIVE METABOLIC PANEL
Alkaline Phosphatase: 36 U/L — ABNORMAL LOW (ref 39–117)
BUN: 18 mg/dL (ref 6–23)
CO2: 26 mEq/L (ref 19–32)
Creatinine, Ser: 1.11 mg/dL (ref 0.50–1.35)
Glucose, Bld: 123 mg/dL — ABNORMAL HIGH (ref 70–99)
Sodium: 139 mEq/L (ref 135–145)
Total Bilirubin: 2 mg/dL — ABNORMAL HIGH (ref 0.3–1.2)

## 2011-08-28 LAB — LACTATE DEHYDROGENASE: LDH: 165 U/L (ref 94–250)

## 2011-08-28 NOTE — Telephone Encounter (Signed)
Gave pt appt for lab and x-ray on 02/19/12 and see ML after a few days

## 2011-08-31 ENCOUNTER — Encounter: Payer: Self-pay | Admitting: Oncology

## 2011-08-31 DIAGNOSIS — I10 Essential (primary) hypertension: Secondary | ICD-10-CM

## 2011-08-31 DIAGNOSIS — R7989 Other specified abnormal findings of blood chemistry: Secondary | ICD-10-CM

## 2011-08-31 DIAGNOSIS — D751 Secondary polycythemia: Secondary | ICD-10-CM

## 2011-08-31 DIAGNOSIS — R945 Abnormal results of liver function studies: Secondary | ICD-10-CM | POA: Insufficient documentation

## 2011-08-31 HISTORY — DX: Secondary polycythemia: D75.1

## 2011-08-31 HISTORY — DX: Other specified abnormal findings of blood chemistry: R79.89

## 2011-08-31 HISTORY — DX: Essential (primary) hypertension: I10

## 2011-08-31 HISTORY — DX: Abnormal results of liver function studies: R94.5

## 2011-08-31 NOTE — Progress Notes (Signed)
CC:   [name], [name]  Followup visit for this pleasant 51 year old man diagnosed with Burkitt lymphoma in May 2007, when he presented with constitutional symptoms, low back pain, diffuse bone pain, low-grade fever, rapidly progressive pancytopenia, hyperuricemia, and marked elevation of serum LDH to over 5000.  Although morphologically and clinically this appeared to be a Burkitt-like lymphoma, he did not have the classic 8;14 translocation. He was treated with an aggressive chemotherapy program modeled after a previous cancer and leukemia group B study with induction Cytoxan, vincristine, prednisone alternating with ifosfamide, methotrexate, vincristine, and Ara-C with additional etoposide, Cytoxan, Adriamycin, and vincristine.  He received intrathecal chemotherapy with methotrexate, hydrocortisone, and subsequently received cranial radiation.  He achieved a complete and durable response, now out a little over 6 years.  He has had no interim medical problems.  He is on an exercise and diet program and has lost a considerable amount of weight.  He is taking a diet pill intermittently as well.  He has had no interim fevers or infections.  No recurrent headaches, which was a prominent part of his symptoms when he was under treatment for his lymphoma in the past.  PHYSICAL EXAMINATION:  Head and Neck:  Normal.  Lungs:  Clear and resonant to percussion.  Heart:  Regular cardiac rhythm.  No murmur. Lymph:  There is an approximate 2-cm lymph node palpable in the left axilla, which I do not recall from prior exams.  No other areas of adenopathy in the neck, supraclavicular, right axillary, or inguinal regions.  Abdomen:  Soft and nontender.  No mass, no organomegaly. Extremities:  No edema.  No calf tenderness.  Neurologic:  With mental status intact.  Cranial nerves intact.  Pupils equal, round, reactive to light.  Optic disks sharp.  Vessels normal.  Motor strength  5/5.  LABORATORIES:  Random glucose is 123.  Total bilirubin is 2.0, SGOT 25, SGPT 31, alkaline phosphatase actually low at 36, but stable compared with previous values.  Hemoglobin is 17.9 with hematocrit 53, MCV 94.5, white count 9400 with 83% neutrophils, 11% lymphocytes, 6 monocytes, platelet count 132,000.  Serum LDH 165.  Chest x-ray done July 25th, which I personally reviewed is normal.  IMPRESSIONS: 1. Burkitt lymphoma.     a.     He remains in remission now out over 6 years from diagnosis      and intensive 1-year treatment multi-agent chemotherapy, including      intrathecal prophylaxis.     b.     I think that the small lymph node palpable in his left      axilla is insignificant and was probably there before and not      palpable until he lost a significant amount of weight through his      diet and exercise program.  I do not think we need to do anything      except observation at this point. 2. Polycythemia.     a.     I initially thought that this was related to a developing      myeloproliferative disorder relative to his previous chemotherapy      treatments.  However, it turns out he is taking intramuscular      testosterone every 2 weeks and I am sure that this is the reason      why his hemoglobin is up. 3. Mild elevation of bilirubin.  I am not clear whether this is     medication related.  Possibly  the frequent testosterone injections.     There is no evidence for a hemolytic process with a normal     hemoglobin and LDH.  Plan at this point is observation alone. 4. Essential hypertension, controlled on current medication.    ______________________________ [name], [name], F.A.C.P. JMG/MEDQ  D:  08/31/2011  T:  08/31/2011  Job:  [id number]

## 2012-02-19 ENCOUNTER — Other Ambulatory Visit (HOSPITAL_BASED_OUTPATIENT_CLINIC_OR_DEPARTMENT_OTHER): Payer: 59 | Admitting: Lab

## 2012-02-19 ENCOUNTER — Ambulatory Visit (HOSPITAL_COMMUNITY)
Admission: RE | Admit: 2012-02-19 | Discharge: 2012-02-19 | Disposition: A | Discharge status: Home / Self Care | Payer: 59 | Source: Ambulatory Visit | Attending: Oncology | Admitting: Oncology

## 2012-02-19 ENCOUNTER — Other Ambulatory Visit: Payer: Self-pay | Admitting: *Deleted

## 2012-02-19 DIAGNOSIS — C8370 Burkitt lymphoma, unspecified site: Secondary | ICD-10-CM

## 2012-02-19 DIAGNOSIS — C8589 Other specified types of non-Hodgkin lymphoma, extranodal and solid organ sites: Secondary | ICD-10-CM | POA: Insufficient documentation

## 2012-02-19 DIAGNOSIS — C8379 Burkitt lymphoma, extranodal and solid organ sites: Secondary | ICD-10-CM

## 2012-02-19 LAB — CBC WITH DIFFERENTIAL/PLATELET
BASO%: 0.3 % (ref 0.0–2.0)
HCT: 55.2 % — ABNORMAL HIGH (ref 38.4–49.9)
LYMPH%: 22.7 % (ref 14.0–49.0)
MCH: 32.2 pg (ref 27.2–33.4)
MCHC: 33.7 g/dL (ref 32.0–36.0)
MCV: 95.5 fL (ref 79.3–98.0)
MONO#: 0.3 10*3/uL (ref 0.1–0.9)
MONO%: 4.9 % (ref 0.0–14.0)
NEUT%: 71 % (ref 39.0–75.0)
Platelets: 148 10*3/uL (ref 140–400)
WBC: 6.4 10*3/uL (ref 4.0–10.3)

## 2012-02-19 LAB — COMPREHENSIVE METABOLIC PANEL (CC13)
Albumin: 4 g/dL (ref 3.5–5.0)
Alkaline Phosphatase: 42 U/L (ref 40–150)
BUN: 22 mg/dL (ref 7.0–26.0)
Calcium: 9.6 mg/dL (ref 8.4–10.4)
Chloride: 103 mEq/L (ref 98–107)
Creatinine: 1.3 mg/dL (ref 0.7–1.3)
Glucose: 84 mg/dl (ref 70–99)
Potassium: 4.2 mEq/L (ref 3.5–5.1)

## 2012-02-19 LAB — LACTATE DEHYDROGENASE (CC13): LDH: 176 U/L (ref 125–245)

## 2012-02-26 ENCOUNTER — Telehealth: Payer: Self-pay | Admitting: Oncology

## 2012-02-26 ENCOUNTER — Ambulatory Visit (HOSPITAL_BASED_OUTPATIENT_CLINIC_OR_DEPARTMENT_OTHER): Payer: 59 | Admitting: Nurse Practitioner

## 2012-02-26 VITALS — BP 157/102 | HR 84 | Temp 97.1°F | Resp 20 | Ht 74.0 in | Wt 235.4 lb

## 2012-02-26 DIAGNOSIS — C8379 Burkitt lymphoma, extranodal and solid organ sites: Secondary | ICD-10-CM

## 2012-02-26 DIAGNOSIS — D582 Other hemoglobinopathies: Secondary | ICD-10-CM

## 2012-02-26 DIAGNOSIS — I10 Essential (primary) hypertension: Secondary | ICD-10-CM

## 2012-02-26 DIAGNOSIS — C8370 Burkitt lymphoma, unspecified site: Secondary | ICD-10-CM

## 2012-02-26 NOTE — Progress Notes (Signed)
OFFICE PROGRESS NOTE  Interval history:  [name] is a 52 year old man who was diagnosed with Burkitt's lymphoma in may 2007. He is seen today for scheduled followup.  No interim illnesses or infections. No fevers or sweats. He has a good appetite. Overall weight is stable. He has a good energy level. He continues working full-time. He has intermittent joint pains at the left knee and left elbow. No cough or shortness of breath. No chest pain. Bowels moving regularly.   Objective: Blood pressure 157/102, pulse 84, temperature 97.1 F (36.2 C), temperature source Oral, resp. rate 20, height 6\' 2"  (1.88 m), weight 235 lb 6.4 oz (106.777 kg).  Oropharynx is without thrush or ulceration. 2 cm lymph node in the left axilla. No other palpable cervical, supraclavicular, axillary or inguinal lymph nodes. Lungs are clear. Regular cardiac rhythm. Abdomen soft and nontender. No organomegaly. Extremities without edema. Motor strength 5 over 5.  Lab Results: Lab Results  Component Value Date   WBC 6.4 02/19/2012   HGB 18.6* 02/19/2012   HCT 55.2* 02/19/2012   MCV 95.5 02/19/2012   PLT 148 02/19/2012    Chemistry:    Chemistry      Component Value Date/Time   NA 139 02/19/2012 0806   NA 139 08/28/2011 1050   K 4.2 02/19/2012 0806   K 4.2 08/28/2011 1050   CL 103 02/19/2012 0806   CL 104 08/28/2011 1050   CO2 27 02/19/2012 0806   CO2 26 08/28/2011 1050   BUN 22.0 02/19/2012 0806   BUN 18 08/28/2011 1050   CREATININE 1.3 02/19/2012 0806   CREATININE 1.11 08/28/2011 1050      Component Value Date/Time   CALCIUM 9.6 02/19/2012 0806   CALCIUM 9.7 08/28/2011 1050   ALKPHOS 42 02/19/2012 0806   ALKPHOS 36* 08/28/2011 1050   AST 24 02/19/2012 0806   AST 25 08/28/2011 1050   ALT 28 02/19/2012 0806   ALT 31 08/28/2011 1050   BILITOT 3.18* 02/19/2012 0806   BILITOT 2.0* 08/28/2011 1050       Studies/Results: Dg Chest 2 View  02/19/2012  *RADIOLOGY REPORT*  Clinical Data: Vertex lymphoma.  CHEST - 2 VIEW   Comparison: Two-view chest 08/27/2011.  Findings: The heart size is normal.  The lungs are clear.  The visualized soft tissues and bony thorax are unremarkable.  No significant adenopathy is present.  IMPRESSION: Negative two-view chest.   Original Report Authenticated By: San Morelle, [name]     Medications: I have reviewed the patient's current medications.  Assessment/Plan:  1. Burkitt's lymphoma presenting in May 2007 with rapidly progressive pancytopenia, constitutional symptoms, progressive low back pain and migratory bone pain, low grade fever, hyperuricemia, and marked elevation of serum LDH to over 5000. Bone marrow biopsy was 100% cellular with 93% immature cells with light microscopy and clinical features all compatible with Burkitt's lymphoma, but cytogenetic studies did not show the classic 8;14 translocation. He was treated on an aggressive protocol modeled after the cancer and leukemia group B with induction Cytoxan, vincristine, and prednisone, and then alternating cycles of ifosfamide, methotrexate, vincristine, Ara-C, and etoposide with Cytoxan, Adriamycin, and vincristine. Intrathecal chemotherapy with methotrexate and hydrocortisone and cranial radiation. He achieved a complete remission.  2. Elevated hemoglobin/hematocrit. May be due to intramuscular testosterone injections. 3. History of mild elevation of bilirubin. More elevated on recent labs. Plan to repeat at a three week interval. 4. Essential hypertension. Blood pressure was elevated in the office today. He reports readings in the "  normal range" at home.  Disposition-Mr. Mcgregor appears stable. He remains in clinical remission from the Burkitt's lymphoma.  He has a history of mild elevation of bilirubin. The bilirubin was more elevated on labs done one week ago. We will obtain a repeat level at a 3 week interval.  Otherwise he will return for a followup visit in 6 months with labs and a chest x-ray one week prior. He  will contact the office in the interim with any problems.  Plan reviewed with Dr. Beryle Beams.  Ned Card ANP/GNP-BC

## 2012-02-26 NOTE — Telephone Encounter (Signed)
Gave pt appt for July 2014 lab before MD

## 2012-03-09 ENCOUNTER — Encounter: Payer: Self-pay | Admitting: Oncology

## 2012-03-11 ENCOUNTER — Encounter: Payer: Self-pay | Admitting: *Deleted

## 2012-03-19 ENCOUNTER — Other Ambulatory Visit: Payer: Self-pay

## 2012-04-13 ENCOUNTER — Other Ambulatory Visit: Payer: Self-pay | Admitting: Oncology

## 2012-04-13 DIAGNOSIS — D751 Secondary polycythemia: Secondary | ICD-10-CM

## 2012-04-13 DIAGNOSIS — C8370 Burkitt lymphoma, unspecified site: Secondary | ICD-10-CM

## 2012-04-14 ENCOUNTER — Telehealth: Payer: Self-pay | Admitting: Oncology

## 2012-04-14 NOTE — Telephone Encounter (Signed)
Called pt and left message for appt on 3/19 lab and ML

## 2012-04-17 ENCOUNTER — Encounter: Payer: Self-pay | Admitting: Oncology

## 2012-04-18 ENCOUNTER — Telehealth: Payer: Self-pay | Admitting: Oncology

## 2012-04-18 NOTE — Telephone Encounter (Signed)
Talked to pt he is aware of appt on 04/20/12 lab and ML

## 2012-04-20 ENCOUNTER — Ambulatory Visit (HOSPITAL_BASED_OUTPATIENT_CLINIC_OR_DEPARTMENT_OTHER): Payer: 59 | Admitting: Nurse Practitioner

## 2012-04-20 ENCOUNTER — Other Ambulatory Visit (HOSPITAL_BASED_OUTPATIENT_CLINIC_OR_DEPARTMENT_OTHER): Payer: 59

## 2012-04-20 VITALS — BP 142/83 | HR 96 | Temp 96.9°F | Resp 96 | Ht 74.0 in | Wt 242.9 lb

## 2012-04-20 DIAGNOSIS — C8370 Burkitt lymphoma, unspecified site: Secondary | ICD-10-CM

## 2012-04-20 DIAGNOSIS — D582 Other hemoglobinopathies: Secondary | ICD-10-CM

## 2012-04-20 DIAGNOSIS — I10 Essential (primary) hypertension: Secondary | ICD-10-CM

## 2012-04-20 DIAGNOSIS — R972 Elevated prostate specific antigen [PSA]: Secondary | ICD-10-CM

## 2012-04-20 DIAGNOSIS — D751 Secondary polycythemia: Secondary | ICD-10-CM

## 2012-04-20 DIAGNOSIS — C8379 Burkitt lymphoma, extranodal and solid organ sites: Secondary | ICD-10-CM

## 2012-04-20 LAB — CBC WITH DIFFERENTIAL/PLATELET
Basophils Absolute: 0 10*3/uL (ref 0.0–0.1)
Eosinophils Absolute: 0 10*3/uL (ref 0.0–0.5)
HGB: 17.8 g/dL — ABNORMAL HIGH (ref 13.0–17.1)
LYMPH%: 18.4 % (ref 14.0–49.0)
MCV: 92.9 fL (ref 79.3–98.0)
MONO%: 5.2 % (ref 0.0–14.0)
NEUT#: 6.6 10*3/uL — ABNORMAL HIGH (ref 1.5–6.5)
Platelets: 131 10*3/uL — ABNORMAL LOW (ref 140–400)
RBC: 5.62 10*6/uL (ref 4.20–5.82)

## 2012-04-20 LAB — CHCC SMEAR

## 2012-04-20 NOTE — Progress Notes (Signed)
OFFICE PROGRESS NOTE  Interval history:  [name] is a 52 year old man who was diagnosed with Burkitt's lymphoma in May 2007. He returns prior to scheduled followup due to the receipt of labs from Dr. Greta Doom office done 04/01/2012 showing a hemoglobin of 18.9, hematocrit 57.4, white count 13, platelet count 154,000.  The hemoglobin and hematocrit have been elevated dating to approximately 09/15/2010 at which time the hemoglobin was 17.6 and hematocrit 50.9. On 02/23/2011 the hemoglobin returned at 17.6 and hematocrit 51.1. JAK 2 mutation analysis 02/23/2011 was negative for the JAK 2 V617F mutation. An erythropoietin level on 02/23/2011 was mildly elevated at 37.2.  Dr. Beryle Beams felt that the reason for the elevation of the hemoglobin was related to intramuscular testosterone injections.  Most recent labs done through our office on 02/19/2012 showed a hemoglobin of 18.6 and hematocrit 55.2.  [name] reports that he feels well. No fevers or sweats. He has a good appetite and good energy level. He reports recently completing a course of antibiotics for a urinary tract infection. PSA recently found to be elevated. He has been referred to urology.   Objective: Blood pressure 142/83, pulse 96, temperature 96.9 F (36.1 C), temperature source Oral, resp. rate 96, height 6\' 2"  (1.88 m), weight 242 lb 14.4 oz (110.179 kg).  Oropharynx is without thrush or ulceration. No palpable cervical, supraclavicular, right axillary lymph nodes. Stable approximate 2 cm left axillary lymph node. Lungs clear. Regular cardiac rhythm. Abdomen soft and nontender. No organomegaly. Extremities without edema.  Lab Results: Lab Results  Component Value Date   WBC 8.7 04/20/2012   HGB 17.8* 04/20/2012   HCT 52.2* 04/20/2012   MCV 92.9 04/20/2012   PLT 131* 04/20/2012    Chemistry:    Chemistry      Component Value Date/Time   NA 139 02/19/2012 0806   NA 139 08/28/2011 1050   K 4.2 02/19/2012 0806   K 4.2 08/28/2011  1050   CL 103 02/19/2012 0806   CL 104 08/28/2011 1050   CO2 27 02/19/2012 0806   CO2 26 08/28/2011 1050   BUN 22.0 02/19/2012 0806   BUN 18 08/28/2011 1050   CREATININE 1.3 02/19/2012 0806   CREATININE 1.11 08/28/2011 1050      Component Value Date/Time   CALCIUM 9.6 02/19/2012 0806   CALCIUM 9.7 08/28/2011 1050   ALKPHOS 42 02/19/2012 0806   ALKPHOS 36* 08/28/2011 1050   AST 24 02/19/2012 0806   AST 25 08/28/2011 1050   ALT 28 02/19/2012 0806   ALT 31 08/28/2011 1050   BILITOT 3.18* 02/19/2012 0806   BILITOT 2.0* 08/28/2011 1050       Studies/Results: No results found.  Medications: I have reviewed the patient's current medications.  Assessment/Plan:  1. Burkitt's lymphoma presenting in May 2007 with rapidly progressive pancytopenia, constitutional symptoms, progressive low back pain and migratory bone pain, low grade fever, hyperuricemia, and marked elevation of serum LDH to over 5000. Bone marrow biopsy was 100% cellular with 93% immature cells with light microscopy and clinical features all compatible with Burkitt's lymphoma, but cytogenetic studies did not show the classic 8;14 translocation. He was treated on an aggressive protocol modeled after the cancer and leukemia group B with induction Cytoxan, vincristine, and prednisone, and then alternating cycles of ifosfamide, methotrexate, vincristine, Ara-C, and etoposide with Cytoxan, Adriamycin, and vincristine. Intrathecal chemotherapy with methotrexate and hydrocortisone and cranial radiation. He achieved a complete remission.  2. Elevated hemoglobin/hematocrit. Likely due to intramuscular testosterone injections. 3. History  of mild elevation of bilirubin. 4. Essential hypertension.  5. Elevated PSA. He reports an upcoming appointment with urology.  Disposition-the hemoglobin/hematocrit remains overall stable. The elevation is likely due to the testosterone injections. However, cannot rule out a myeloproliferative disorder related to  previous chemotherapy. We will continue to follow with routine labs. Next visit is scheduled July 2014.  Patient seen with Dr. Beryle Beams.  Ned Card ANP/GNP-BC

## 2012-04-21 LAB — ERYTHROPOIETIN: Erythropoietin: 27.5 m[IU]/mL — ABNORMAL HIGH (ref 2.6–18.5)

## 2012-06-03 ENCOUNTER — Other Ambulatory Visit: Payer: Self-pay | Admitting: Orthopedic Surgery

## 2012-06-03 DIAGNOSIS — M25562 Pain in left knee: Secondary | ICD-10-CM

## 2012-06-10 ENCOUNTER — Ambulatory Visit
Admission: RE | Admit: 2012-06-10 | Discharge: 2012-06-10 | Disposition: A | Discharge status: Home / Self Care | Payer: 59 | Source: Ambulatory Visit | Attending: Orthopedic Surgery | Admitting: Orthopedic Surgery

## 2012-06-10 DIAGNOSIS — M25562 Pain in left knee: Secondary | ICD-10-CM

## 2012-08-12 ENCOUNTER — Other Ambulatory Visit (HOSPITAL_BASED_OUTPATIENT_CLINIC_OR_DEPARTMENT_OTHER): Payer: 59

## 2012-08-12 ENCOUNTER — Ambulatory Visit (HOSPITAL_COMMUNITY)
Admission: RE | Admit: 2012-08-12 | Discharge: 2012-08-12 | Disposition: A | Discharge status: Home / Self Care | Payer: 59 | Source: Ambulatory Visit | Attending: Nurse Practitioner | Admitting: Nurse Practitioner

## 2012-08-12 DIAGNOSIS — C8379 Burkitt lymphoma, extranodal and solid organ sites: Secondary | ICD-10-CM | POA: Insufficient documentation

## 2012-08-12 DIAGNOSIS — C8370 Burkitt lymphoma, unspecified site: Secondary | ICD-10-CM

## 2012-08-12 LAB — CBC WITH DIFFERENTIAL/PLATELET
Basophils Absolute: 0 10*3/uL (ref 0.0–0.1)
Eosinophils Absolute: 0.1 10*3/uL (ref 0.0–0.5)
HGB: 17.9 g/dL — ABNORMAL HIGH (ref 13.0–17.1)
MCV: 95.3 fL (ref 79.3–98.0)
MONO%: 7 % (ref 0.0–14.0)
NEUT#: 3.4 10*3/uL (ref 1.5–6.5)
RDW: 13.3 % (ref 11.0–14.6)

## 2012-08-12 LAB — COMPREHENSIVE METABOLIC PANEL (CC13)
ALT: 41 U/L (ref 0–55)
Albumin: 3.9 g/dL (ref 3.5–5.0)
CO2: 25 mEq/L (ref 22–29)
Calcium: 9.5 mg/dL (ref 8.4–10.4)
Chloride: 104 mEq/L (ref 98–109)
Creatinine: 1.2 mg/dL (ref 0.7–1.3)
Potassium: 4 mEq/L (ref 3.5–5.1)
Sodium: 137 mEq/L (ref 136–145)
Total Protein: 6.7 g/dL (ref 6.4–8.3)

## 2012-08-12 LAB — LACTATE DEHYDROGENASE (CC13): LDH: 215 U/L (ref 125–245)

## 2012-08-19 ENCOUNTER — Ambulatory Visit (HOSPITAL_BASED_OUTPATIENT_CLINIC_OR_DEPARTMENT_OTHER): Payer: 59 | Admitting: Nurse Practitioner

## 2012-08-19 ENCOUNTER — Telehealth: Payer: Self-pay | Admitting: Oncology

## 2012-08-19 VITALS — BP 154/92 | HR 90 | Temp 98.0°F | Resp 20 | Ht 74.0 in | Wt 234.1 lb

## 2012-08-19 DIAGNOSIS — C8379 Burkitt lymphoma, extranodal and solid organ sites: Secondary | ICD-10-CM

## 2012-08-19 DIAGNOSIS — C8370 Burkitt lymphoma, unspecified site: Secondary | ICD-10-CM

## 2012-08-19 NOTE — Telephone Encounter (Signed)
gv and printed appt sched and avs for pt  °

## 2012-08-19 NOTE — Progress Notes (Signed)
OFFICE PROGRESS NOTE  Interval history:  [name] is a 52 year old man diagnosed with Burkitt's lymphoma in May of 2007. He presented with rapidly progressive pancytopenia, constitutional symptoms, progressive low back pain and migratory bone pain, low grade fever, hyperuricemia, and marked elevation of serum LDH to over 5000. Bone marrow biopsy was 100% cellular with 93% immature cells with light microscopy and clinical features all compatible with Burkitt's lymphoma, but cytogenetic studies did not show the classic 8;14 translocation. He was treated on an aggressive protocol modeled after the cancer and leukemia group B with induction Cytoxan, vincristine, and prednisone, and then alternating cycles of ifosfamide, methotrexate, vincristine, Ara-C, and etoposide with Cytoxan, Adriamycin, and vincristine. Intrathecal chemotherapy with methotrexate and hydrocortisone and cranial radiation. He achieved a complete remission.   The hemoglobin and hematocrit have been stably elevated dating to approximately 09/15/2010 at which time the hemoglobin was 17.6 and hematocrit 50.9. On 02/23/2011 the hemoglobin returned at 17.6 and hematocrit 51.1. JAK 2 mutation analysis 02/23/2011 was negative for the JAK 2 V617F mutation. An erythropoietin level on 02/23/2011 was mildly elevated at 37.2. Most recent CBC prior to today's visit on 04/20/2012 showed a hemoglobin of 17.8/ hematocrit 52.2. The elevation is felt to likely be due to testosterone injections.   He is seen today for scheduled followup. He feels well. No interim illnesses or infections. No fevers or sweats. He has a good appetite. He reports recent intentional weight loss. He has chronic bilateral knee pain. No shortness of breath or cough. No bowel or bladder problems.    Objective: Blood pressure 154/92, pulse 90, temperature 98 F (36.7 C), temperature source Oral, resp. rate 20, height 6\' 2"  (1.88 m), weight 234 lb 1.6 oz (106.187 kg).  Oropharynx is  without thrush or ulceration. No palpable cervical, supraclavicular, right axillary or inguinal lymph nodes. Stable 1-2 cm left axillary lymph node. Lungs clear. Regular cardiac rhythm. Abdomen soft and nontender. No organomegaly. Extremities without edema. Calves soft and nontender.  Lab Results: Lab Results  Component Value Date   WBC 5.3 08/12/2012   HGB 17.9* 08/12/2012   HCT 53.2* 08/12/2012   MCV 95.3 08/12/2012   PLT 135* 08/12/2012    Chemistry:    Chemistry      Component Value Date/Time   NA 137 08/12/2012 0801   NA 139 08/28/2011 1050   K 4.0 08/12/2012 0801   K 4.2 08/28/2011 1050   CL 103 02/19/2012 0806   CL 104 08/28/2011 1050   CO2 25 08/12/2012 0801   CO2 26 08/28/2011 1050   BUN 20.8 08/12/2012 0801   BUN 18 08/28/2011 1050   CREATININE 1.2 08/12/2012 0801   CREATININE 1.11 08/28/2011 1050      Component Value Date/Time   CALCIUM 9.5 08/12/2012 0801   CALCIUM 9.7 08/28/2011 1050   ALKPHOS 40 08/12/2012 0801   ALKPHOS 36* 08/28/2011 1050   AST 28 08/12/2012 0801   AST 25 08/28/2011 1050   ALT 41 08/12/2012 0801   ALT 31 08/28/2011 1050   BILITOT 1.75* 08/12/2012 0801   BILITOT 2.0* 08/28/2011 1050     08/12/2012 LDH 215; ESR 1.  Studies/Results: Dg Chest 2 View  08/12/2012   *RADIOLOGY REPORT*  Clinical Data:Burkitt lymphoma  CHEST - 2 VIEW  Comparison: 02/19/2012  Findings: Cardiomediastinal silhouette is stable.  No acute infiltrate or pleural effusion.  No pulmonary edema.  Bilateral nodular nipple shadow again noted. Bony thorax is stable.  IMPRESSION: No active disease.  No significant change.  Original Report Authenticated By: Lahoma Crocker, [name]    Medications: I have reviewed the patient's current medications.  Assessment/Plan:  1. Burkitt's lymphoma presenting in May 2007 with rapidly progressive pancytopenia, constitutional symptoms, progressive low back pain and migratory bone pain, low grade fever, hyperuricemia, and marked elevation of serum LDH to over 5000. Bone  marrow biopsy was 100% cellular with 93% immature cells with light microscopy and clinical features all compatible with Burkitt's lymphoma, but cytogenetic studies did not show the classic 8;14 translocation. He was treated on an aggressive protocol modeled after the cancer and leukemia group B with induction Cytoxan, vincristine, and prednisone, and then alternating cycles of ifosfamide, methotrexate, vincristine, Ara-C, and etoposide with Cytoxan, Adriamycin, and vincristine. Intrathecal chemotherapy with methotrexate and hydrocortisone and cranial radiation. He achieved a complete remission.  2. Elevated hemoglobin/hematocrit. Stable. JAK 2 mutation analysis 02/23/2011 negative for the JAK 2 V617F mutation. Erythropoietin level 02/23/2011 mildly elevated at 37.2. The elevated hemoglobin and hematocrit may be due to intramuscular testosterone injections. Myeloproliferative disorder related to previous chemotherapy also a consideration. 3. History of mild elevation of bilirubin.  4. Essential hypertension. 5. History of elevated PSA. He has been evaluated by urology.  Disposition-[name] remains in clinical remission from the Burkitt's lymphoma. He will return for labs and a followup visit in 6 months. He will contact the office in the interim with any problems.  Ned Card ANP/GNP-BC

## 2012-08-22 ENCOUNTER — Telehealth: Payer: Self-pay | Admitting: Oncology

## 2012-08-22 NOTE — Telephone Encounter (Signed)
s,w, pt and advised on 61mth appt per LT staff msg...pt ok and aware

## 2012-12-08 ENCOUNTER — Other Ambulatory Visit: Payer: Self-pay

## 2013-01-20 DIAGNOSIS — E349 Endocrine disorder, unspecified: Secondary | ICD-10-CM | POA: Insufficient documentation

## 2013-02-16 ENCOUNTER — Ambulatory Visit (HOSPITAL_COMMUNITY)
Admission: RE | Admit: 2013-02-16 | Discharge: 2013-02-16 | Disposition: A | Discharge status: Home / Self Care | Payer: 59 | Source: Ambulatory Visit | Attending: Nurse Practitioner | Admitting: Nurse Practitioner

## 2013-02-16 ENCOUNTER — Other Ambulatory Visit (HOSPITAL_BASED_OUTPATIENT_CLINIC_OR_DEPARTMENT_OTHER): Payer: 59

## 2013-02-16 DIAGNOSIS — C8370 Burkitt lymphoma, unspecified site: Secondary | ICD-10-CM

## 2013-02-16 DIAGNOSIS — C8379 Burkitt lymphoma, extranodal and solid organ sites: Secondary | ICD-10-CM

## 2013-02-16 DIAGNOSIS — Z09 Encounter for follow-up examination after completed treatment for conditions other than malignant neoplasm: Secondary | ICD-10-CM | POA: Insufficient documentation

## 2013-02-16 DIAGNOSIS — Z87898 Personal history of other specified conditions: Secondary | ICD-10-CM | POA: Insufficient documentation

## 2013-02-16 LAB — CBC WITH DIFFERENTIAL/PLATELET
BASO%: 0.6 % (ref 0.0–2.0)
BASOS ABS: 0 10*3/uL (ref 0.0–0.1)
EOS ABS: 0.1 10*3/uL (ref 0.0–0.5)
EOS%: 1.3 % (ref 0.0–7.0)
HCT: 53.9 % — ABNORMAL HIGH (ref 38.4–49.9)
HEMOGLOBIN: 18.1 g/dL — AB (ref 13.0–17.1)
LYMPH%: 22.8 % (ref 14.0–49.0)
MCH: 31.7 pg (ref 27.2–33.4)
MCHC: 33.6 g/dL (ref 32.0–36.0)
MCV: 94.6 fL (ref 79.3–98.0)
MONO#: 0.5 10*3/uL (ref 0.1–0.9)
MONO%: 6.5 % (ref 0.0–14.0)
NEUT%: 68.8 % (ref 39.0–75.0)
NEUTROS ABS: 5.4 10*3/uL (ref 1.5–6.5)
PLATELETS: 149 10*3/uL (ref 140–400)
RBC: 5.7 10*6/uL (ref 4.20–5.82)
RDW: 14.9 % — ABNORMAL HIGH (ref 11.0–14.6)
WBC: 7.8 10*3/uL (ref 4.0–10.3)
lymph#: 1.8 10*3/uL (ref 0.9–3.3)

## 2013-02-16 LAB — LACTATE DEHYDROGENASE (CC13): LDH: 182 U/L (ref 125–245)

## 2013-02-16 LAB — COMPREHENSIVE METABOLIC PANEL (CC13)
ALK PHOS: 42 U/L (ref 40–150)
ALT: 28 U/L (ref 0–55)
AST: 25 U/L (ref 5–34)
Albumin: 4.1 g/dL (ref 3.5–5.0)
Anion Gap: 10 mEq/L (ref 3–11)
BILIRUBIN TOTAL: 1.81 mg/dL — AB (ref 0.20–1.20)
BUN: 17.4 mg/dL (ref 7.0–26.0)
CO2: 25 mEq/L (ref 22–29)
CREATININE: 1.2 mg/dL (ref 0.7–1.3)
Calcium: 9.4 mg/dL (ref 8.4–10.4)
Chloride: 104 mEq/L (ref 98–109)
GLUCOSE: 123 mg/dL (ref 70–140)
Potassium: 3.9 mEq/L (ref 3.5–5.1)
Sodium: 139 mEq/L (ref 136–145)
Total Protein: 6.9 g/dL (ref 6.4–8.3)

## 2013-02-16 LAB — SEDIMENTATION RATE: SED RATE: 1 mm/h (ref 0–16)

## 2013-02-17 ENCOUNTER — Other Ambulatory Visit: Payer: 59 | Admitting: Lab

## 2013-02-24 ENCOUNTER — Other Ambulatory Visit: Payer: 59 | Admitting: Lab

## 2013-02-24 ENCOUNTER — Ambulatory Visit: Payer: 59 | Admitting: Nurse Practitioner

## 2013-02-24 ENCOUNTER — Ambulatory Visit: Payer: 59 | Admitting: Oncology

## 2013-02-27 ENCOUNTER — Telehealth: Payer: Self-pay | Admitting: Oncology

## 2013-02-27 ENCOUNTER — Ambulatory Visit (HOSPITAL_BASED_OUTPATIENT_CLINIC_OR_DEPARTMENT_OTHER): Payer: 59 | Admitting: Oncology

## 2013-02-27 VITALS — BP 161/96 | HR 95 | Temp 98.3°F | Resp 20 | Ht 74.0 in | Wt 241.0 lb

## 2013-02-27 DIAGNOSIS — C8379 Burkitt lymphoma, extranodal and solid organ sites: Secondary | ICD-10-CM

## 2013-02-27 DIAGNOSIS — C8370 Burkitt lymphoma, unspecified site: Secondary | ICD-10-CM

## 2013-02-27 DIAGNOSIS — D751 Secondary polycythemia: Secondary | ICD-10-CM

## 2013-02-27 DIAGNOSIS — I10 Essential (primary) hypertension: Secondary | ICD-10-CM

## 2013-02-27 NOTE — Progress Notes (Signed)
Hematology and Oncology Follow Up Visit  [name] [id number] 1960-08-12 53 y.o. 02/27/2013 4:35 PM   Principle Diagnosis: Encounter Diagnoses  Name Primary?  . Burkitt's lymphoma Yes  . Polycythemia, secondary      Interim History:  Six-month interval followup visit for this pleasant 53 year old man who  initially presented in April 2007 with severe pain in his back and extremities as  well as weakness, fatigue and fever. He rapidly developed anemia,  thrombocytopenia and liver function abnormalities. Outpatient  evaluation for an infectious process was unrevealing with a normal hepatitis  profile and negative HIV. Initial Lyme titer was elevated and he was  started on a course of tetracycline.  Due to progressive pain and laboratory abnormalities, he was  admitted  to the hospital Jun 26, 2005. The peripheral blood smear showed greater  than 30% blasts. Bone marrow aspirate/biopsy on Jun 29, 2005, was 100%  cellular with over 80% blasts. The blasts were typical of those seen with  Burkitt's lymphoma/leukemia, although the chromosome studies failed to  reveal the typical of 8;14 translocation. Serum LDH rapidly rose to 18,000  and he had rapid deterioration in his liver functions with development of  profound hypoglycemia and lactic acidosis. Emergency treatment was  initiated. Initial chemotherapy consisted of a five day induction course  with low dose Cytoxan, steroids and vincristine. This was followed by  consolidation chemotherapy with a combination of chemotherapy drugs  including ifosfamide, high dose methotrexate, Ara-C, etoposide, additional  vincristine and steroids based on the then  most currently available modified cancer and leukemia group B protocol although he was treated off study. He also began a program of intrathecal chemotherapy with methotrexate, Ara-C and hydrocortisone. He  completed  seven of a planned seven cycles . He began the seventh cycle  on  October 29, 2005. His program also incorporated Rituxan. He achieved a complete and durable response and is now out over 7 years.  On examination one year ago 02/26/2012, a 2 cm lymph node was palpable in his left axilla which had not been noted on prior exams. He was otherwise asymptomatic. Laboratory profile unremarkable. No other areas of adenopathy. I Elected to follow this without biopsy. He was examined again on 04/20/2012 and the lymph node has not enlarged. On exam today, it remains the same:  still present and palpable but not any larger. No other areas of adenopathy. Serum LDH normal. Chest radiograph normal.  Since his last visit he has had some arthroscopic surgery on his right knee. No other interim medical problems. No interim infections. Performance status is 0. He continues to work full-time.    Medications: reviewed  Allergies:  Allergies  Allergen Reactions  . Vicodin [Hydrocodone-Acetaminophen] Nausea And Vomiting    Review of Systems: Hematology:  No bleeding or bruising ENT ROS: No sore throat Breast ROS:  Respiratory ROS: No cough or dyspnea Cardiovascular ROS:  No chest pain or palpitations Gastrointestinal ROS:  No abdominal pain no change in bowel habit  Genito-Urinary ROS: No urinary tract symptoms Musculoskeletal ROS: No bone pain. Some problems with his right knee from degenerative arthritis. Neurological ROS: No headache or change in vision Dermatological ROS: No rash Remaining ROS negative:   Physical Exam: Blood pressure 161/96, pulse 95, temperature 98.3 F (36.8 C), temperature source Oral, resp. rate 20, height 6\' 2"  (1.88 m), weight 241 lb (109.317 kg). Wt Readings from Last 3 Encounters:  02/27/13 241 lb (109.317 kg)  08/19/12 234 lb 1.6 oz (106.187  kg)  04/20/12 242 lb 14.4 oz (110.179 kg)     General appearance: Well-nourished Caucasian man HENNT: Pharynx no erythema, exudate, mass, or ulcer. No thyromegaly or thyroid nodules Lymph  nodes: No cervical, or supraclavicular,  Lymphadenopathy. Persistent 2 cm lymph node palpable left axilla unchanged Breasts:  Lungs: Clear to auscultation, resonant to percussion throughout Heart: Regular rhythm, no murmur, no gallop, no rub, no click, no edema Abdomen: Soft, nontender, normal bowel sounds, no mass, no organomegaly Extremities: No edema, no calf tenderness Musculoskeletal: no joint deformities GU:  Vascular: Carotid pulses 2+, no bruits,  Neurologic: Alert, oriented, PERRLA,  cranial nerves grossly normal, motor strength 5 over 5, reflexes 1+ symmetric, upper body coordination normal, gait normal, Skin: No rash or ecchymosis  Lab Results: CBC W/Diff    Component Value Date/Time   WBC 7.8 02/16/2013 1556   WBC 8.1 11/20/2008 1530   RBC 5.70 02/16/2013 1556   RBC 5.08 11/20/2008 1530   HGB 18.1* 02/16/2013 1556   HGB 15.5 11/20/2008 1530   HCT 53.9* 02/16/2013 1556   HCT 46.3 11/20/2008 1530   PLT 149 02/16/2013 1556   PLT 173 11/20/2008 1530   MCV 94.6 02/16/2013 1556   MCV 91.2 11/20/2008 1530   MCH 31.7 02/16/2013 1556   MCHC 33.6 02/16/2013 1556   MCHC 33.4 11/20/2008 1530   RDW 14.9* 02/16/2013 1556   RDW 15.0 11/20/2008 1530   LYMPHSABS 1.8 02/16/2013 1556   LYMPHSABS 1.9 11/20/2008 1530   MONOABS 0.5 02/16/2013 1556   MONOABS 0.5 11/20/2008 1530   EOSABS 0.1 02/16/2013 1556   EOSABS 0.1 11/20/2008 1530   BASOSABS 0.0 02/16/2013 1556   BASOSABS 0.0 11/20/2008 1530     Chemistry      Component Value Date/Time   NA 139 02/16/2013 1556   NA 139 08/28/2011 1050   K 3.9 02/16/2013 1556   K 4.2 08/28/2011 1050   CL 103 02/19/2012 0806   CL 104 08/28/2011 1050   CO2 25 02/16/2013 1556   CO2 26 08/28/2011 1050   BUN 17.4 02/16/2013 1556   BUN 18 08/28/2011 1050   CREATININE 1.2 02/16/2013 1556   CREATININE 1.11 08/28/2011 1050      Component Value Date/Time   CALCIUM 9.4 02/16/2013 1556   CALCIUM 9.7 08/28/2011 1050   ALKPHOS 42 02/16/2013 1556   ALKPHOS 36* 08/28/2011  1050   AST 25 02/16/2013 1556   AST 25 08/28/2011 1050   ALT 28 02/16/2013 1556   ALT 31 08/28/2011 1050   BILITOT 1.81* 02/16/2013 1556   BILITOT 2.0* 08/28/2011 1050       Radiological Studies: Dg Chest 2 View  02/16/2013   CLINICAL DATA:  Burkitt's lymphoma 7 years ago no current complaints  EXAM: CHEST  2 VIEW  COMPARISON:  08/12/2012  FINDINGS: The heart size and vascular pattern are normal. The lungs are clear. Bilateral nipple shadows are unchanged when compared to the prior study. No pleural effusions. Bony thorax normal.  IMPRESSION: No active cardiopulmonary disease.   Electronically Signed   By: Skipper Cliche [name]   On: 02/16/2013 15:55    Impression:  #1. Burkitt's lymphoma treated as outlined above. He is out now over 7 years from diagnosis and treatment and likely cured. I don't think that the single, stable, 2 cm lymph node in his left axilla represents a recurrence of his Burkitt's. If he begins to grow or if any other areas of adenopathy occur,  then I would proceed with  a biopsy  #2. Stable polycythemia Likely secondary to androgen replacement with depot testosterone  #3. Essential hypertension  I would transition his care to Dr. Julieanne Manson.   CC: Patient Care Team: Florina Ou, MD as PCP - General (Family Medicine)   Annia Belt, MD 1/26/20154:35 PM

## 2013-02-27 NOTE — Telephone Encounter (Signed)
gv and printed appt sched and avs fo rpt for July °

## 2013-08-11 ENCOUNTER — Other Ambulatory Visit: Payer: 59

## 2013-08-21 ENCOUNTER — Ambulatory Visit: Payer: 59 | Admitting: Oncology

## 2013-08-28 ENCOUNTER — Ambulatory Visit (HOSPITAL_BASED_OUTPATIENT_CLINIC_OR_DEPARTMENT_OTHER): Payer: 59 | Admitting: Nurse Practitioner

## 2013-08-28 ENCOUNTER — Other Ambulatory Visit (HOSPITAL_BASED_OUTPATIENT_CLINIC_OR_DEPARTMENT_OTHER): Payer: 59

## 2013-08-28 VITALS — BP 152/107 | HR 77 | Temp 98.6°F | Resp 20 | Ht 74.0 in | Wt 228.7 lb

## 2013-08-28 DIAGNOSIS — D751 Secondary polycythemia: Secondary | ICD-10-CM

## 2013-08-28 DIAGNOSIS — R7989 Other specified abnormal findings of blood chemistry: Secondary | ICD-10-CM

## 2013-08-28 DIAGNOSIS — I10 Essential (primary) hypertension: Secondary | ICD-10-CM

## 2013-08-28 DIAGNOSIS — C8370 Burkitt lymphoma, unspecified site: Secondary | ICD-10-CM

## 2013-08-28 DIAGNOSIS — C8379 Burkitt lymphoma, extranodal and solid organ sites: Secondary | ICD-10-CM

## 2013-08-28 DIAGNOSIS — F909 Attention-deficit hyperactivity disorder, unspecified type: Secondary | ICD-10-CM

## 2013-08-28 DIAGNOSIS — R945 Abnormal results of liver function studies: Secondary | ICD-10-CM

## 2013-08-28 LAB — CBC WITH DIFFERENTIAL/PLATELET
BASO%: 0.4 % (ref 0.0–2.0)
BASOS ABS: 0 10*3/uL (ref 0.0–0.1)
EOS%: 0.8 % (ref 0.0–7.0)
Eosinophils Absolute: 0.1 10*3/uL (ref 0.0–0.5)
HCT: 52.8 % — ABNORMAL HIGH (ref 38.4–49.9)
HEMOGLOBIN: 17.3 g/dL — AB (ref 13.0–17.1)
LYMPH#: 1.3 10*3/uL (ref 0.9–3.3)
LYMPH%: 17.8 % (ref 14.0–49.0)
MCH: 31.6 pg (ref 27.2–33.4)
MCHC: 32.7 g/dL (ref 32.0–36.0)
MCV: 96.5 fL (ref 79.3–98.0)
MONO#: 0.4 10*3/uL (ref 0.1–0.9)
MONO%: 5.8 % (ref 0.0–14.0)
NEUT%: 75.2 % — ABNORMAL HIGH (ref 39.0–75.0)
NEUTROS ABS: 5.4 10*3/uL (ref 1.5–6.5)
Platelets: 139 10*3/uL — ABNORMAL LOW (ref 140–400)
RBC: 5.48 10*6/uL (ref 4.20–5.82)
RDW: 14.3 % (ref 11.0–14.6)
WBC: 7.2 10*3/uL (ref 4.0–10.3)

## 2013-08-28 LAB — COMPREHENSIVE METABOLIC PANEL (CC13)
ALK PHOS: 39 U/L — AB (ref 40–150)
ALT: 27 U/L (ref 0–55)
ANION GAP: 8 meq/L (ref 3–11)
AST: 23 U/L (ref 5–34)
Albumin: 3.9 g/dL (ref 3.5–5.0)
BUN: 12.3 mg/dL (ref 7.0–26.0)
CALCIUM: 9.7 mg/dL (ref 8.4–10.4)
CO2: 26 mEq/L (ref 22–29)
Chloride: 106 mEq/L (ref 98–109)
Creatinine: 1 mg/dL (ref 0.7–1.3)
GLUCOSE: 111 mg/dL (ref 70–140)
POTASSIUM: 4.3 meq/L (ref 3.5–5.1)
SODIUM: 140 meq/L (ref 136–145)
TOTAL PROTEIN: 6.8 g/dL (ref 6.4–8.3)
Total Bilirubin: 1.25 mg/dL — ABNORMAL HIGH (ref 0.20–1.20)

## 2013-08-28 LAB — LACTATE DEHYDROGENASE (CC13): LDH: 187 U/L (ref 125–245)

## 2013-08-28 LAB — SEDIMENTATION RATE: Sed Rate: 1 mm/hr (ref 0–16)

## 2013-08-28 NOTE — Progress Notes (Addendum)
  [place] OFFICE PROGRESS NOTE   Diagnosis:  Burkitt's lymphoma.  INTERVAL HISTORY:   [name] returns as scheduled. He feels well. No fevers or sweats. He has a good appetite and good energy level. He denies shortness breath. No cough. He denies pain. He recently began Adderall for ADHD. He has noted an increase in his blood pressure.  Objective:  Vital signs in last 24 hours:  Blood pressure 152/107, pulse 77, temperature 98.6 F (37 C), temperature source Oral, resp. rate 20, height $RemoveBe'6\' 2"'PQoDrcmqx$  (1.88 m), weight 228 lb 11.2 oz (103.738 kg), SpO2 98.00%.    HEENT: No thrush or ulcerations. Lymphatics: No palpable cervical, supraclavicular, right axillary or inguinal lymph nodes. Stable one to 2 cm left axillary lymph node. Resp: Lungs clear. Cardio: Regular cardiac rhythm. GI: Abdomen soft and nontender. No hepatomegaly. No splenomegaly. Vascular: No leg edema.     Lab Results:  Lab Results  Component Value Date   WBC 7.2 08/28/2013   HGB 17.3* 08/28/2013   HCT 52.8* 08/28/2013   MCV 96.5 08/28/2013   PLT 139* 08/28/2013   NEUTROABS 5.4 08/28/2013    Imaging:  No results found.  Medications: I have reviewed the patient's current medications.  Assessment/Plan: 1. Burkitt's lymphoma presenting in May 2007 with rapidly progressive pancytopenia, constitutional symptoms, progressive low back pain and migratory bone pain, low grade fever, hyperuricemia, and marked elevation of serum LDH to over 5000. Bone marrow biopsy was 100% cellular with 93% immature cells with light microscopy and clinical features all compatible with Burkitt's lymphoma, but cytogenetic studies did not show the classic 8;14 translocation. He was treated on an aggressive protocol modeled after the cancer and leukemia group B with induction Cytoxan, vincristine, and prednisone, and then alternating cycles of ifosfamide, methotrexate, vincristine, Ara-C, and etoposide with Cytoxan, Adriamycin, and  vincristine. Intrathecal chemotherapy with methotrexate and hydrocortisone and cranial radiation. He achieved a complete remission.  2. Polycythemia. Stable. JAK 2 mutation analysis 02/23/2011 negative for the JAK 2 [id number] mutation. Erythropoietin level 02/23/2011 mildly elevated at 37.2. Felt to likely be secondary to testosterone replacement. 3. History of mild elevation of bilirubin.  4. Essential hypertension. 5. History of elevated PSA. He has been evaluated by urology. 6. ADHD recently started on Adderall.   Disposition: [name] appears stable. He remains in clinical remission from Burkitt's lymphoma. He will return for a followup visit in one year. He will contact the office in the interim with any problems.   He will contact his primary provider regarding the elevation in his blood pressure since beginning Adderall.  Patient seen with Dr. Benay Spice.    Ned Card ANP/GNP-BC   08/28/2013  4:48 PM   This was a shared visit with Ned Card. [name] remains in clinical remission from Burkitt's lymphoma. He will return for an office and lab visit in one year.  Julieanne Manson, [name]

## 2013-08-30 ENCOUNTER — Telehealth: Payer: Self-pay | Admitting: Oncology

## 2013-08-30 NOTE — Telephone Encounter (Signed)
Per 07/27 POF labs/ov, mailed AVS to pt...KJ °

## 2014-02-02 DIAGNOSIS — Z8669 Personal history of other diseases of the nervous system and sense organs: Secondary | ICD-10-CM

## 2014-02-02 HISTORY — DX: Personal history of other diseases of the nervous system and sense organs: Z86.69

## 2014-03-04 DIAGNOSIS — F32A Depression, unspecified: Secondary | ICD-10-CM | POA: Insufficient documentation

## 2014-03-04 DIAGNOSIS — N529 Male erectile dysfunction, unspecified: Secondary | ICD-10-CM | POA: Insufficient documentation

## 2014-03-04 DIAGNOSIS — F909 Attention-deficit hyperactivity disorder, unspecified type: Secondary | ICD-10-CM | POA: Insufficient documentation

## 2014-03-04 DIAGNOSIS — B359 Dermatophytosis, unspecified: Secondary | ICD-10-CM | POA: Insufficient documentation

## 2014-05-07 ENCOUNTER — Telehealth: Payer: Self-pay | Admitting: *Deleted

## 2014-05-07 ENCOUNTER — Telehealth: Payer: Self-pay | Admitting: Oncology

## 2014-05-07 DIAGNOSIS — C8370 Burkitt lymphoma, unspecified site: Secondary | ICD-10-CM

## 2014-05-07 NOTE — Addendum Note (Signed)
Addended by: Brien Few on: 05/07/2014 02:37 PM   Modules accepted: Orders

## 2014-05-07 NOTE — Telephone Encounter (Signed)
Pt called states he is having persistent cough for 3 weeks now and also sweating all the time..... KJ, transferred pt to triage.Marland KitchenMarland KitchenMarland Kitchen

## 2014-05-07 NOTE — Telephone Encounter (Signed)
Returned call to pt, he denies fevers or weight loss. Hasn't noticed any swollen lymph nodes. He is concerned about the sweating. Reviewed with Dr. Benay Spice: Order received for [facility] visit and labs. Pt requests afternoon appt. Appt given for lab 4/5 at 1400 with office visit to follow. He voiced understanding, appreciation.

## 2014-05-07 NOTE — Telephone Encounter (Signed)
Patient called complaining of a persistent cough and sweating. The cough started after a cold and has lasted for 3 weeks. The sweating usually occurs 8-10 times throughout the day. Patient states that sweating has occurred before but not to this extent. Patient denies fever. Message forwarded to MD Benay Spice and RN Rosalio Macadamia.

## 2014-05-07 NOTE — Telephone Encounter (Signed)
See phone note

## 2014-05-08 ENCOUNTER — Ambulatory Visit (HOSPITAL_BASED_OUTPATIENT_CLINIC_OR_DEPARTMENT_OTHER): Payer: 59 | Admitting: Nurse Practitioner

## 2014-05-08 ENCOUNTER — Other Ambulatory Visit (HOSPITAL_BASED_OUTPATIENT_CLINIC_OR_DEPARTMENT_OTHER): Payer: 59

## 2014-05-08 ENCOUNTER — Telehealth: Payer: Self-pay | Admitting: Nurse Practitioner

## 2014-05-08 ENCOUNTER — Encounter: Payer: Self-pay | Admitting: Nurse Practitioner

## 2014-05-08 ENCOUNTER — Ambulatory Visit (HOSPITAL_COMMUNITY)
Admission: RE | Admit: 2014-05-08 | Discharge: 2014-05-08 | Disposition: A | Discharge status: Home / Self Care | Payer: 59 | Source: Ambulatory Visit | Attending: Nurse Practitioner | Admitting: Nurse Practitioner

## 2014-05-08 VITALS — BP 143/80 | HR 78 | Temp 98.2°F | Resp 22 | Wt 232.8 lb

## 2014-05-08 DIAGNOSIS — Z8572 Personal history of non-Hodgkin lymphomas: Secondary | ICD-10-CM | POA: Diagnosis not present

## 2014-05-08 DIAGNOSIS — B3789 Other sites of candidiasis: Secondary | ICD-10-CM

## 2014-05-08 DIAGNOSIS — C8370 Burkitt lymphoma, unspecified site: Secondary | ICD-10-CM

## 2014-05-08 DIAGNOSIS — J40 Bronchitis, not specified as acute or chronic: Secondary | ICD-10-CM | POA: Diagnosis not present

## 2014-05-08 DIAGNOSIS — R05 Cough: Secondary | ICD-10-CM | POA: Insufficient documentation

## 2014-05-08 LAB — CBC WITH DIFFERENTIAL/PLATELET
BASO%: 0.2 % (ref 0.0–2.0)
BASOS ABS: 0 10*3/uL (ref 0.0–0.1)
EOS ABS: 0.1 10*3/uL (ref 0.0–0.5)
EOS%: 1 % (ref 0.0–7.0)
HCT: 49 % (ref 38.4–49.9)
HEMOGLOBIN: 16.2 g/dL (ref 13.0–17.1)
LYMPH#: 1.7 10*3/uL (ref 0.9–3.3)
LYMPH%: 18 % (ref 14.0–49.0)
MCH: 31.9 pg (ref 27.2–33.4)
MCHC: 33.1 g/dL (ref 32.0–36.0)
MCV: 96.5 fL (ref 79.3–98.0)
MONO#: 0.6 10*3/uL (ref 0.1–0.9)
MONO%: 6.1 % (ref 0.0–14.0)
NEUT%: 74.7 % (ref 39.0–75.0)
NEUTROS ABS: 7.2 10*3/uL — AB (ref 1.5–6.5)
Platelets: 199 10*3/uL (ref 140–400)
RBC: 5.08 10*6/uL (ref 4.20–5.82)
RDW: 13.6 % (ref 11.0–14.6)
WBC: 9.7 10*3/uL (ref 4.0–10.3)

## 2014-05-08 LAB — COMPREHENSIVE METABOLIC PANEL (CC13)
ALT: 26 U/L (ref 0–55)
ANION GAP: 10 meq/L (ref 3–11)
AST: 19 U/L (ref 5–34)
Albumin: 3.6 g/dL (ref 3.5–5.0)
Alkaline Phosphatase: 57 U/L (ref 40–150)
BUN: 23.9 mg/dL (ref 7.0–26.0)
CALCIUM: 8.8 mg/dL (ref 8.4–10.4)
CHLORIDE: 105 meq/L (ref 98–109)
CO2: 26 meq/L (ref 22–29)
CREATININE: 1.1 mg/dL (ref 0.7–1.3)
EGFR: 72 mL/min/{1.73_m2} — ABNORMAL LOW (ref >90)
Glucose: 133 mg/dl (ref 70–140)
Potassium: 4.3 mEq/L (ref 3.5–5.1)
SODIUM: 141 meq/L (ref 136–145)
TOTAL PROTEIN: 7.1 g/dL (ref 6.4–8.3)
Total Bilirubin: 0.77 mg/dL (ref 0.20–1.20)

## 2014-05-08 LAB — LACTATE DEHYDROGENASE (CC13): LDH: 183 U/L (ref 125–245)

## 2014-05-08 MED ORDER — FLUCONAZOLE 100 MG PO TABS: 100.0000 mg | ORAL_TABLET | Freq: Every day | ORAL | Status: DC

## 2014-05-08 MED ORDER — BENZONATATE 200 MG PO CAPS: 200.0000 mg | ORAL_CAPSULE | Freq: Three times a day (TID) | ORAL | Status: DC | PRN

## 2014-05-08 MED ORDER — AZITHROMYCIN 250 MG PO TABS: ORAL_TABLET | ORAL | Status: DC

## 2014-05-08 MED ORDER — AZITHROMYCIN 250 MG PO TABS
ORAL_TABLET | ORAL | Status: DC
Start: 2014-05-08 — End: 2014-05-08

## 2014-05-08 NOTE — Assessment & Plan Note (Signed)
Patient complains of a chronic, intermittent rash to his groin folds.  He states that he typically uses nystatin powder and an over-the-counter antifungal cream with only partial effectiveness.  He states that when the rash is severe-he does have some intense pruritus.  He denies any dysuria, hematuria, or discharge from his penis.  He states that he tries his best to keep his groin area dry at all times as well.  He has tried Diflucan 150 mg tablet as a one-time dose in the past; he states that this did help.  Prescribed patient Diflucan 100 mg on a daily basis to try.

## 2014-05-08 NOTE — Assessment & Plan Note (Signed)
Patient complaining of a 2-1/2 week history of chronic, nonproductive cough.  He denies any wheezes.  He denies any shortness breath or pain with inspiration.  He denies any recent fevers or chills.  He denies any URI symptoms whatsoever.  On exam-bilateral breath sounds clear; with no wheeze.  Patient was noted to have a dry cough while at the cancer center.  Chest x-ray obtained today revealed no pneumonia or other acute finding.  Will prescribe Zithromax antibiotic and Tessalon Perles for treatment of bronchitis symptoms.  Advised patient to call/return if his symptoms persist or worsen whatsoever.

## 2014-05-08 NOTE — Assessment & Plan Note (Addendum)
Patient is complaining of some diaphoresis both day and night on intermittent basis.  He states that these sweats have been occurring since this past fall 2015.  He denies any worsening issues with fatigue or shortness of breath.  He denies any recent fevers or chills.  On exam-patient's chronic left axilla lymph node is not palpable.  All labs today were within normal range; and LDH was stable at 183.  Bilirubin has returned to baseline and is normal.  Of note-patient was diagnosed in January 2016 with mild ADHD.  He was initially prescribed Adderall; but this increased his blood pressure.  He has been switched from Adderall to Dexistat to manage his ADHD HD.  Patient states blood pressure much better under control with this new medication.  Patient was advised to follow-up with his primary care provider if his chronic diaphoretic episodes continue.  Patient is scheduled to return for labs and a follow-up visit on 09/03/2014.  He knows to call in the interim with any new worries or concerns.

## 2014-05-08 NOTE — Telephone Encounter (Signed)
Patient has already given permission for me to leave a voicemail on his cell phone regarding both lab results and chest x-ray results.  Left voicemail to patient advising that patient's LDH was stable at 183 today.  Also, patient's chest x-ray obtained earlier this afternoon was negative for pneumonia or other acute findings.  Advised patient to continue with plan to take Zithromax and use Tessalon Perles for cough/bronchitis symptoms.  Advised patient to call/return or go directly to the emergency department for any new or worsening symptoms whatsoever.

## 2014-05-08 NOTE — Progress Notes (Signed)
SYMPTOM MANAGEMENT CLINIC   HPI: [name] 54 y.o. male diagnosed with Burkitt's lymphoma.  Currently undergoing observation only.  Patient called the cancer Center today to request an urgent care visit.  Patient is complaining of a 2-1/2 week history of a chronic, nonproductive cough.  His cough is keeping him awake at night.  Also, patient reports that he has been experiencing diaphoretic episodes both day and night since this past fall 2015.  He denies any increased fatigue or issues with his appetite.  Patient also reports that he has chronic, intermittent yeast-like groin rashes.  He states that he typically uses nystatin powder and an over-the-counter antifungal cream to his groin folds; but this is only moderately effective.  He states that he has taken a 1 time dose of Diflucan 150 mg as well; and this helped much better.  Patient also was diagnosed with mild ADHD this past January 2016.  His initially prescribed Adderall for the ADHD; but this elevated his blood pressure.  He has now been switched from Adderall to Dexistat; and reports that this is working much better for him.  Patient states his blood pressure has remained stable.   HPI  ROS  Past Medical History  Diagnosis Date  . HTN (hypertension), benign 08/31/2011  . Polycythemia, secondary 08/31/2011    Likely due to testosterone injections  . Abnormal liver function test 08/31/2011    Mild elevation bilirubin, normal/high hemoglobin, normal LDH    History reviewed. No pertinent past surgical history.  has Burkitt's lymphoma; HTN (hypertension), benign; Polycythemia, secondary; Abnormal liver function test; Bronchitis; and Candida rash of groin on his problem list.    is allergic to vicodin.    Medication List       This list is accurate as of: 05/08/14  5:05 PM.  Always use your most recent med list.               aspirin 81 MG tablet  Take 160 mg by mouth daily.     azithromycin 250 MG tablet    Commonly known as:  ZITHROMAX Z-PAK  Take 2 tabs (500 mg) PO on day # 1; then take 1 tab (250 mg) PO QD till gone.     benzonatate 200 MG capsule  Commonly known as:  TESSALON  Take 1 capsule (200 mg total) by mouth 3 (three) times daily as needed for cough.     buPROPion 300 MG 24 hr tablet  Commonly known as:  WELLBUTRIN XL     dextroamphetamine 5 MG tablet  Commonly known as:  DEXTROSTAT  Take 1 tablet by mouth 3 (three) times daily.     fluconazole 100 MG tablet  Commonly known as:  DIFLUCAN  Take 1 tablet (100 mg total) by mouth daily.     lisinopril 10 MG tablet  Commonly known as:  PRINIVIL,ZESTRIL  Take 40 mg by mouth daily.     meloxicam 15 MG tablet  Commonly known as:  MOBIC  Take 1 tablet by mouth as needed.     nystatin 100000 UNIT/GM Powd     omeprazole 20 MG capsule  Commonly known as:  PRILOSEC  Take 20 mg by mouth daily.     rosuvastatin 10 MG tablet  Commonly known as:  CRESTOR  Take 5 mg by mouth daily.     sildenafil 100 MG tablet  Commonly known as:  VIAGRA  Take 100 mg by mouth daily as needed. Takes 1/2 tab prn  testosterone cypionate 200 MG/ML injection  Commonly known as:  DEPOTESTOTERONE CYPIONATE  Inject 100 mg into the muscle every 14 (fourteen) days.         PHYSICAL EXAMINATION  Oncology Vitals 05/08/2014 08/28/2013 08/28/2013 02/27/2013 08/19/2012 04/20/2012 02/26/2012  Height - - 188 cm 188 cm 188 cm 188 cm 188 cm  Weight 105.597 kg - 103.738 kg 109.317 kg 106.187 kg 110.179 kg 106.777 kg  Weight (lbs) 232 lbs 13 oz - 228 lbs 11 oz 241 lbs 234 lbs 2 oz 242 lbs 14 oz 235 lbs 6 oz  BMI (kg/m2) - - 29.36 kg/m2 30.94 kg/m2 30.06 kg/m2 31.19 kg/m2 30.22 kg/m2  Temp 98.2 - 98.6 98.3 98 96.9 97.1  Pulse 78 77 80 95 90 96 84  Resp 22 - '20 20 20 ' 96 20  Resp (Historical as of 09/03/11) - - - - - - -  SpO2 - - 98 - - - -  BSA (m2) - - 2.33 m2 2.39 m2 2.35 m2 2.4 m2 2.36 m2   BP Readings from Last 3 Encounters:  05/08/14 143/80  08/28/13  152/107  02/27/13 161/96    Physical Exam  Constitutional: He is oriented to person, place, and time and well-developed, well-nourished, and in no distress.  HENT:  Head: Normocephalic and atraumatic.  Mouth/Throat: Oropharynx is clear and moist.  Eyes: Conjunctivae and EOM are normal. Pupils are equal, round, and reactive to light. Right eye exhibits no discharge. Left eye exhibits no discharge. No scleral icterus.  Neck: Normal range of motion. Neck supple. No JVD present. No tracheal deviation present. No thyromegaly present.  Cardiovascular: Normal rate, regular rhythm, normal heart sounds and intact distal pulses.   Pulmonary/Chest: Effort normal and breath sounds normal. No respiratory distress. He has no wheezes. He has no rales. He exhibits no tenderness.  Patient with dry, nonproductive cough while at the cancer center.  Abdominal: Soft. Bowel sounds are normal. He exhibits no distension and no mass. There is no tenderness. There is no rebound and no guarding.  Musculoskeletal: Normal range of motion. He exhibits no edema or tenderness.  Lymphadenopathy:    He has no cervical adenopathy.  Neurological: He is alert and oriented to person, place, and time. Gait normal.  Skin: Skin is warm and dry. No erythema. No pallor.  Did not examine groin area.  Psychiatric: Affect normal.  Nursing note and vitals reviewed.   LABORATORY DATA:. Appointment on 05/08/2014  Component Date Value Ref Range Status  . WBC 05/08/2014 9.7  4.0 - 10.3 10e3/uL Final  . NEUT# 05/08/2014 7.2* 1.5 - 6.5 10e3/uL Final  . HGB 05/08/2014 16.2  13.0 - 17.1 g/dL Final  . HCT 05/08/2014 49.0  38.4 - 49.9 % Final  . Platelets 05/08/2014 199  140 - 400 10e3/uL Final  . MCV 05/08/2014 96.5  79.3 - 98.0 fL Final  . MCH 05/08/2014 31.9  27.2 - 33.4 pg Final  . MCHC 05/08/2014 33.1  32.0 - 36.0 g/dL Final  . RBC 05/08/2014 5.08  4.20 - 5.82 10e6/uL Final  . RDW 05/08/2014 13.6  11.0 - 14.6 % Final  . lymph#  05/08/2014 1.7  0.9 - 3.3 10e3/uL Final  . MONO# 05/08/2014 0.6  0.1 - 0.9 10e3/uL Final  . Eosinophils Absolute 05/08/2014 0.1  0.0 - 0.5 10e3/uL Final  . Basophils Absolute 05/08/2014 0.0  0.0 - 0.1 10e3/uL Final  . NEUT% 05/08/2014 74.7  39.0 - 75.0 % Final  . LYMPH% 05/08/2014 18.0  14.0 - 49.0 % Final  . MONO% 05/08/2014 6.1  0.0 - 14.0 % Final  . EOS% 05/08/2014 1.0  0.0 - 7.0 % Final  . BASO% 05/08/2014 0.2  0.0 - 2.0 % Final  . Sodium 05/08/2014 141  136 - 145 mEq/L Final  . Potassium 05/08/2014 4.3  3.5 - 5.1 mEq/L Final  . Chloride 05/08/2014 105  98 - 109 mEq/L Final  . CO2 05/08/2014 26  22 - 29 mEq/L Final  . Glucose 05/08/2014 133  70 - 140 mg/dl Final  . BUN 05/08/2014 23.9  7.0 - 26.0 mg/dL Final  . Creatinine 05/08/2014 1.1  0.7 - 1.3 mg/dL Final  . Total Bilirubin 05/08/2014 0.77  0.20 - 1.20 mg/dL Final  . Alkaline Phosphatase 05/08/2014 57  40 - 150 U/L Final  . AST 05/08/2014 19  5 - 34 U/L Final  . ALT 05/08/2014 26  0 - 55 U/L Final  . Total Protein 05/08/2014 7.1  6.4 - 8.3 g/dL Final  . Albumin 05/08/2014 3.6  3.5 - 5.0 g/dL Final  . Calcium 05/08/2014 8.8  8.4 - 10.4 mg/dL Final  . Anion Gap 05/08/2014 10  3 - 11 mEq/L Final  . EGFR 05/08/2014 72* >90 ml/min/1.73 m2 Final   eGFR is calculated using the CKD-EPI Creatinine Equation (2009)  . LDH 05/08/2014 183  125 - 245 U/L Final     RADIOGRAPHIC STUDIES: Dg Chest 2 View  05/08/2014   CLINICAL DATA:  Increase cough, history of lymphoma  EXAM: CHEST  2 VIEW  COMPARISON:  Chest x-ray of 02/16/2013  FINDINGS: No active infiltrate or effusion is seen. Mediastinal and hilar contours are unremarkable. The heart is within normal limits in size. No bony abnormality is seen.  IMPRESSION: No active cardiopulmonary disease.   Electronically Signed   By: Ivar Drape [name]   On: 05/08/2014 16:39    ASSESSMENT/PLAN:    Candida rash of groin Patient complains of a chronic, intermittent rash to his groin folds.  He states  that he typically uses nystatin powder and an over-the-counter antifungal cream with only partial effectiveness.  He states that when the rash is severe-he does have some intense pruritus.  He denies any dysuria, hematuria, or discharge from his penis.  He states that he tries his best to keep his groin area dry at all times as well.  He has tried Diflucan 150 mg tablet as a one-time dose in the past; he states that this did help.  Prescribed patient Diflucan 100 mg on a daily basis to try.   Burkitt's lymphoma Patient is complaining of some diaphoresis both day and night on intermittent basis.  He states that these sweats have been occurring since this past fall 2015.  He denies any worsening issues with fatigue or shortness of breath.  He denies any recent fevers or chills.  On exam-patient's chronic left axilla lymph node is not palpable.  All labs today were within normal range; and LDH was stable at 183.  Bilirubin has returned to baseline and is normal.  Of note-patient was diagnosed in January 2016 with mild ADHD.  He was initially prescribed Adderall; but this increased his blood pressure.  He has been switched from Adderall to Dexistat to manage his ADHD HD.  Patient states blood pressure much better under control with this new medication.  Patient was advised to follow-up with his primary care provider if his chronic diaphoretic episodes continue.  Patient is scheduled to return for  labs and a follow-up visit on 09/03/2014.  He knows to call in the interim with any new worries or concerns.   Bronchitis Patient complaining of a 2-1/2 week history of chronic, nonproductive cough.  He denies any wheezes.  He denies any shortness breath or pain with inspiration.  He denies any recent fevers or chills.  He denies any URI symptoms whatsoever.  On exam-bilateral breath sounds clear; with no wheeze.  Patient was noted to have a dry cough while at the cancer center.  Chest x-ray obtained today  revealed no pneumonia or other acute finding.  Will prescribe Zithromax antibiotic and Tessalon Perles for treatment of bronchitis symptoms.  Advised patient to call/return if his symptoms persist or worsen whatsoever.    Patient stated understanding of all instructions; and was in agreement with this plan of care. The patient knows to call the clinic with any problems, questions or concerns.   Review/collaboration with Dr. Benay Spice regarding all aspects of patient's visit today.   Total time spent with patient was 40 minutes;  with greater than 75 percent of that time spent in face to face counseling regarding patient's symptoms,  and coordination of care and follow up.  Disclaimer: This note was dictated with voice recognition software. Similar sounding words can inadvertently be transcribed and may not be corrected upon review.   Drue Second, NP 05/08/2014

## 2014-05-10 ENCOUNTER — Telehealth: Payer: Self-pay

## 2014-05-10 NOTE — Telephone Encounter (Signed)
Pt states cough better and Tessalon is working and still taking his Zithromax. Pt was told to call if any questions or concerns.

## 2014-06-25 ENCOUNTER — Ambulatory Visit
Admission: RE | Admit: 2014-06-25 | Discharge: 2014-06-25 | Disposition: A | Discharge status: Home / Self Care | Payer: 59 | Source: Ambulatory Visit | Attending: Nurse Practitioner | Admitting: Nurse Practitioner

## 2014-06-25 ENCOUNTER — Other Ambulatory Visit: Payer: Self-pay | Admitting: Nurse Practitioner

## 2014-06-25 DIAGNOSIS — R609 Edema, unspecified: Secondary | ICD-10-CM

## 2014-06-25 DIAGNOSIS — R52 Pain, unspecified: Secondary | ICD-10-CM

## 2014-08-11 ENCOUNTER — Emergency Department (HOSPITAL_COMMUNITY)
Admission: EM | Admit: 2014-08-11 | Discharge: 2014-08-11 | Discharge status: Against Medical Advice | Payer: 59 | Attending: Emergency Medicine | Admitting: Emergency Medicine

## 2014-08-11 ENCOUNTER — Emergency Department (HOSPITAL_COMMUNITY): Payer: 59

## 2014-08-11 ENCOUNTER — Encounter (HOSPITAL_COMMUNITY): Payer: Self-pay | Admitting: Emergency Medicine

## 2014-08-11 DIAGNOSIS — R079 Chest pain, unspecified: Secondary | ICD-10-CM

## 2014-08-11 DIAGNOSIS — Z79899 Other long term (current) drug therapy: Secondary | ICD-10-CM | POA: Insufficient documentation

## 2014-08-11 DIAGNOSIS — I200 Unstable angina: Secondary | ICD-10-CM | POA: Insufficient documentation

## 2014-08-11 DIAGNOSIS — Z7982 Long term (current) use of aspirin: Secondary | ICD-10-CM | POA: Diagnosis not present

## 2014-08-11 DIAGNOSIS — Z862 Personal history of diseases of the blood and blood-forming organs and certain disorders involving the immune mechanism: Secondary | ICD-10-CM | POA: Diagnosis not present

## 2014-08-11 DIAGNOSIS — Z8589 Personal history of malignant neoplasm of other organs and systems: Secondary | ICD-10-CM | POA: Insufficient documentation

## 2014-08-11 DIAGNOSIS — I10 Essential (primary) hypertension: Secondary | ICD-10-CM | POA: Insufficient documentation

## 2014-08-11 LAB — CBC WITH DIFFERENTIAL/PLATELET
Basophils Absolute: 0 10*3/uL (ref 0.0–0.1)
Basophils Relative: 0 % (ref 0–1)
EOS PCT: 1 % (ref 0–5)
Eosinophils Absolute: 0.1 10*3/uL (ref 0.0–0.7)
HCT: 51.6 % (ref 39.0–52.0)
Hemoglobin: 17 g/dL (ref 13.0–17.0)
Lymphocytes Relative: 21 % (ref 12–46)
Lymphs Abs: 1.3 10*3/uL (ref 0.7–4.0)
MCH: 31.8 pg (ref 26.0–34.0)
MCHC: 32.9 g/dL (ref 30.0–36.0)
MCV: 96.4 fL (ref 78.0–100.0)
Monocytes Absolute: 0.4 10*3/uL (ref 0.1–1.0)
Monocytes Relative: 6 % (ref 3–12)
Neutro Abs: 4.4 10*3/uL (ref 1.7–7.7)
Neutrophils Relative %: 72 % (ref 43–77)
Platelets: 139 10*3/uL — ABNORMAL LOW (ref 150–400)
RBC: 5.35 MIL/uL (ref 4.22–5.81)
RDW: 13.9 % (ref 11.5–15.5)
WBC: 6.1 10*3/uL (ref 4.0–10.5)

## 2014-08-11 LAB — I-STAT TROPONIN, ED
Troponin i, poc: 0.03 ng/mL (ref 0.00–0.08)
Troponin i, poc: 0.07 ng/mL (ref 0.00–0.08)

## 2014-08-11 LAB — BASIC METABOLIC PANEL
Anion gap: 8 (ref 5–15)
BUN: 12 mg/dL (ref 6–20)
CO2: 24 mmol/L (ref 22–32)
Calcium: 9 mg/dL (ref 8.9–10.3)
Chloride: 105 mmol/L (ref 101–111)
Creatinine, Ser: 1.09 mg/dL (ref 0.61–1.24)
GFR calc Af Amer: >60 mL/min (ref >60)
GFR calc non Af Amer: >60 mL/min (ref >60)
GLUCOSE: 71 mg/dL (ref 65–99)
Potassium: 4.4 mmol/L (ref 3.5–5.1)
SODIUM: 137 mmol/L (ref 135–145)

## 2014-08-11 MED ORDER — ASPIRIN 81 MG PO CHEW
162.0000 mg | CHEWABLE_TABLET | Freq: Once | ORAL | Status: AC
  Administered 2014-08-11: 162 mg via ORAL
  Filled 2014-08-11: qty 2

## 2014-08-11 NOTE — ED Notes (Signed)
Pt. Stated, yesterday I started having chest pain and I thought it was just stress. Today I was washing the car and the pain in the middle of my chest came back. My breathing feels fine but doesn't feel normal.

## 2014-08-11 NOTE — ED Provider Notes (Signed)
CSN: [id number]     Arrival date & time 08/11/14  1400 History   First MD Initiated Contact with Patient 08/11/14 1507     Chief Complaint  Patient presents with  . Chest Pain  . Shortness of Breath    HPI Patient is a 54 year old Caucasian male with hx of HTN and no history of MI or stroke presenting today for chest pain. Pt had episode of chest pain that felt like he got "hit in the chest" that lasted for approximately 10 minutes while at rest yesterday morning. Today was washing car similar type of pain when was alleviated by rest. No shortness of breath. Patient with history of lymphoma but has been in remission for the past 8 years. Reports is pain-free at this point and has taken 2 baby aspirin's today. Pain nonradiating rated as a 6 out of 10 maximum.  Past Medical History  Diagnosis Date  . HTN (hypertension), benign 08/31/2011  . Polycythemia, secondary 08/31/2011    Likely due to testosterone injections  . Abnormal liver function test 08/31/2011    Mild elevation bilirubin, normal/high hemoglobin, normal LDH   History reviewed. No pertinent past surgical history. No family history on file. History  Substance Use Topics  . Smoking status: Never Smoker   . Smokeless tobacco: Not on file  . Alcohol Use: Yes    Review of Systems  Constitutional: Negative for fever and chills.  HENT: Negative for congestion and sore throat.   Eyes: Negative for pain.  Respiratory: Negative for cough and shortness of breath.   Cardiovascular: Positive for chest pain. Negative for palpitations and leg swelling.  Gastrointestinal: Negative for nausea, vomiting, abdominal pain and diarrhea.  Endocrine: Negative.   Genitourinary: Negative for flank pain.  Musculoskeletal: Negative for back pain and neck pain.  Skin: Negative for rash.  Allergic/Immunologic: Negative.   Neurological: Negative for dizziness, syncope and light-headedness.  Psychiatric/Behavioral: Negative for confusion.      Allergies  Vicodin  Home Medications   Prior to Admission medications   Medication Sig Start Date End Date Taking? Authorizing Provider  aspirin 81 MG tablet Take 160 mg by mouth daily.    Historical Provider, MD  azithromycin (ZITHROMAX Z-PAK) 250 MG tablet Take 2 tabs (500 mg) PO on day # 1; then take 1 tab (250 mg) PO QD till gone. 05/08/14   Susanne Borders, NP  benzonatate (TESSALON) 200 MG capsule Take 1 capsule (200 mg total) by mouth 3 (three) times daily as needed for cough. 05/08/14   Susanne Borders, NP  buPROPion (WELLBUTRIN XL) 300 MG 24 hr tablet  08/10/13   Historical Provider, MD  dextroamphetamine (DEXTROSTAT) 5 MG tablet Take 1 tablet by mouth 3 (three) times daily. 02/21/14   Historical Provider, MD  fluconazole (DIFLUCAN) 100 MG tablet Take 1 tablet (100 mg total) by mouth daily. 05/08/14   Susanne Borders, NP  lisinopril (PRINIVIL,ZESTRIL) 10 MG tablet Take 40 mg by mouth daily.     Historical Provider, MD  meloxicam (MOBIC) 15 MG tablet Take 1 tablet by mouth as needed. 02/06/13   Historical Provider, MD  nystatin (MYCOSTATIN/NYSTOP) 100000 UNIT/GM POWD  07/24/13   Historical Provider, MD  omeprazole (PRILOSEC) 20 MG capsule Take 20 mg by mouth daily.    Historical Provider, MD  rosuvastatin (CRESTOR) 10 MG tablet Take 5 mg by mouth daily.    Historical Provider, MD  sildenafil (VIAGRA) 100 MG tablet Take 100 mg by mouth daily as needed. Takes  1/2 tab prn    Historical Provider, MD  testosterone cypionate (DEPOTESTOTERONE CYPIONATE) 200 MG/ML injection Inject 100 mg into the muscle every 14 (fourteen) days.    Historical Provider, MD   BP 141/79 mmHg  Pulse 77  Temp(Src) 97.8 F (36.6 C) (Oral)  Resp 18  Ht 6\' 2"  (1.88 m)  Wt 225 lb (102.059 kg)  BMI 28.88 kg/m2  SpO2 94% Physical Exam  Constitutional: He is oriented to person, place, and time. He appears well-developed and well-nourished.  HENT:  Head: Normocephalic and atraumatic.  Eyes: Conjunctivae and EOM are  normal. Pupils are equal, round, and reactive to light.  Neck: Normal range of motion. Neck supple.  Cardiovascular: Normal rate, regular rhythm, S1 normal, S2 normal, normal heart sounds and intact distal pulses.   Pulses:      Radial pulses are 2+ on the right side, and 2+ on the left side.  Pulmonary/Chest: Effort normal and breath sounds normal. No respiratory distress.  Abdominal: Soft. Bowel sounds are normal. There is no tenderness.  Musculoskeletal: Normal range of motion.  Neurological: He is alert and oriented to person, place, and time. He has normal reflexes. No cranial nerve deficit.  Skin: Skin is warm and dry.   ED Course  Procedures (including critical care time) Labs Review Labs Reviewed - No data to display  Imaging Review No results found.   EKG Interpretation   Date/Time:  Saturday August 11 2014 14:04:23 EDT Ventricular Rate:  74 PR Interval:  146 QRS Duration: 86 QT Interval:  362 QTC Calculation: 401 R Axis:   47 Text Interpretation:  Normal sinus rhythm Normal ECG Sinus rhythm Artifact  Abnormal ekg Confirmed by Carmin Muskrat  MD ([id number]) on 08/11/2014 3:07:30  PM      MDM   Final diagnoses:  None   Patient given additional ASA in the ED.  EKG with no acute ischemic changes or arrhythmia.  Initial iStat troponin negative.  CXR with no widened mediastinum, pneumonia or pneumothorax.  Advised pt he was having symptoms of unstable angina and would require admission to the hospital for further stress testing.  He had one further occurrence of chest pain while walking to the bathroom.  Pt requested to leave AMA but agreed to stay until after delta troponin.  Three hour iStat showed elevation to 0.07.  With elevating troponin and continued unstable CP went to advise pt to reconsider leaving AMA.  Pt found to have eloped.  Called number listed and left voicemail indicating the results and advised to return immediately to the ED or to closest hospital of their  choosing.    If performed, labs, EKGs, and imaging were reviewed/interpreted by myself and my attending and incorporated into medical decision making. Pt care supervised by my attending Dr. Berline Chough, MD PGY-2  Emergency Medicine    Geronimo Boot, MD 08/12/14 1203  Carmin Muskrat, MD 08/13/14 2248

## 2014-08-11 NOTE — ED Notes (Signed)
Per tech patient ambulated to bathroom and stated my chest is hurting again.  When asked about pain states my chest isn't hurting anymore.  Physician made aware that lab work is back and patient is wanting to leave.

## 2014-08-11 NOTE — ED Notes (Signed)
Patient left without discharge instructions or signing AMA.  Was not seen leaving room found to be empty.

## 2014-08-11 NOTE — ED Notes (Signed)
Physician went into room to talk to patient.  Patient no longer in the room.  Left without being discharged or signing AMA.  Discussed with patient at length prior to him leaving about how he should stay for further evaluation. Stated I am not stating regardless.

## 2014-08-21 ENCOUNTER — Encounter: Payer: Self-pay | Admitting: Cardiovascular Disease

## 2014-08-21 ENCOUNTER — Ambulatory Visit (INDEPENDENT_AMBULATORY_CARE_PROVIDER_SITE_OTHER): Payer: 59 | Admitting: Cardiovascular Disease

## 2014-08-21 VITALS — BP 150/96 | HR 80 | Ht 74.0 in | Wt 234.0 lb

## 2014-08-21 DIAGNOSIS — I10 Essential (primary) hypertension: Secondary | ICD-10-CM | POA: Diagnosis not present

## 2014-08-21 DIAGNOSIS — R079 Chest pain, unspecified: Secondary | ICD-10-CM

## 2014-08-21 DIAGNOSIS — I209 Angina pectoris, unspecified: Secondary | ICD-10-CM | POA: Diagnosis not present

## 2014-08-21 DIAGNOSIS — E785 Hyperlipidemia, unspecified: Secondary | ICD-10-CM | POA: Diagnosis not present

## 2014-08-21 NOTE — Progress Notes (Signed)
08/21/2014 [name]   [birth year]  [id number]  Primary Physician Florina Ou, MD Primary Cardiologist: Lorretta Harp MD [name]   HPI:  [name] is a 54 year old mild to moderately overweight married Caucasian male with no children accompanied by his wife [name] today. He was self-referred for evaluation of chest pain. His primary care physician is [name]. 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 His cardiac risk factor profile is notable for treated hypertension and hyperlipidemia. There is no family history. He does not smoke. He has never had a heart attack or stroke. He does have a history of Burkitt's lymphoma treated with chemotherapy and radiation therapy currently followed by Dr. Julieanne Manson. He's had 2 episodes of chest pain with the last several weeks number one of which took him to the emergency room myocardial infarction. The pain lasted 30 minutes and did not radiate.   Current Outpatient Prescriptions  Medication Sig Dispense Refill  . aspirin 325 MG tablet Take 325 mg by mouth daily.    [name] buPROPion (WELLBUTRIN XL) 300 MG 24 hr tablet Take 300 mg by mouth daily.     [name] dextroamphetamine (DEXTROSTAT) 5 MG tablet Take 5 mg by mouth 3 (three) times daily.   0  . Esomeprazole Magnesium (NEXIUM PO) Take 2 capsules by mouth daily.    . fluconazole (DIFLUCAN) 100 MG tablet Take 1 tablet (100 mg total) by mouth daily. 10 tablet 2  . ibuprofen (ADVIL,MOTRIN) 200 MG tablet Take 400 mg by mouth daily as needed (pain).    [name] lisinopril (PRINIVIL,ZESTRIL) 40 MG tablet Take 40 mg by mouth daily.     . rosuvastatin (CRESTOR) 5 MG tablet Take 5 mg by mouth daily.    . sildenafil (VIAGRA) 100 MG tablet Take 50 mg by mouth daily as needed for erectile dysfunction.     [name] testosterone cypionate (DEPOTESTOTERONE CYPIONATE) 200 MG/ML injection Inject 100 mg into the muscle every 14 (fourteen) days. Last injection 08/05/14     No current  facility-administered medications for this visit.    Allergies  Allergen Reactions  . Vicodin [Hydrocodone-Acetaminophen] Nausea And Vomiting    History   Social History  . Marital Status: Married    Spouse Name: N/A  . Number of Children: N/A  . Years of Education: N/A   Occupational History  . Not on file.   Social History Main Topics  . Smoking status: Never Smoker   . Smokeless tobacco: Not on file  . Alcohol Use: Yes  . Drug Use: No  . Sexual Activity: Not on file   Other Topics Concern  . Not on file   Social History Narrative     Review of Systems: General: negative for chills, fever, night sweats or weight changes.  Cardiovascular: negative for chest pain, dyspnea on exertion, edema, orthopnea, palpitations, paroxysmal nocturnal dyspnea or shortness of breath Dermatological: negative for rash Respiratory: negative for cough or wheezing Urologic: negative for hematuria Abdominal: negative for nausea, vomiting, diarrhea, bright red blood per rectum, melena, or hematemesis Neurologic: negative for visual changes, syncope, or dizziness All other systems reviewed and are otherwise negative except as noted above.    Blood pressure 150/96, pulse 80, height 6\' 2"  (1.88 m), weight 234 lb (106.142 kg).  General appearance: alert and no distress Neck: no adenopathy, no carotid bruit, no JVD, supple, symmetrical, trachea midline and thyroid not enlarged, symmetric, no tenderness/mass/nodules Lungs: clear to auscultation bilaterally Heart: regular  rate and rhythm, S1, S2 normal, no murmur, click, rub or gallop Extremities: extremities normal, atraumatic, no cyanosis or edema  EKG normal sinus rhythm at 80 without ST or T-wave changes. Hyper sclera. This EKG  ASSESSMENT AND PLAN:   HTN (hypertension), benign History of hypertension with blood pressure measured at 150/96. He is on lisinopril. Blood pressures at home are in the 150/80 range. Continue current  medications  Hyperlipidemia History of hyperlipidemia on Crestor 5 mg a day followed by his PCP  Chest pain [name] has had 2 episodes of chest pain with the last several weeks, one which prompted an ER visit where he ruled out for myocardial infarction. The pain is substernal. Last up to 20-30 minutes with no other associated symptoms.. It is pressure-like. He does have a history of hypertension. I'm going to get an exercise Myoview stress test to rule out an ischemic etiology.      Lorretta Harp MD FACP,FACC,FAHA, [facility] 08/21/2014 11:49 AM

## 2014-08-21 NOTE — Patient Instructions (Signed)
  We will see you back in follow up after the myoview with Dr Gwenlyn Found.   Dr Gwenlyn Found has ordered: Exercise Myoview- this is a test that looks at the blood flow to your heart muscle.  It takes approximately 2 1/2 hours. Please follow instruction sheet, as given.

## 2014-08-21 NOTE — Assessment & Plan Note (Signed)
History of hypertension with blood pressure measured at 150/96. He is on lisinopril. Blood pressures at home are in the 150/80 range. Continue current medications

## 2014-08-21 NOTE — Assessment & Plan Note (Signed)
History of hyperlipidemia on Crestor 5 mg a day followed by his PCP

## 2014-08-21 NOTE — Assessment & Plan Note (Signed)
[name] has had 2 episodes of chest pain with the last several weeks, one which prompted an ER visit where he ruled out for myocardial infarction. The pain is substernal. Last up to 20-30 minutes with no other associated symptoms.. It is pressure-like. He does have a history of hypertension. I'm going to get an exercise Myoview stress test to rule out an ischemic etiology.

## 2014-09-03 ENCOUNTER — Telehealth: Payer: Self-pay | Admitting: Oncology

## 2014-09-03 ENCOUNTER — Other Ambulatory Visit (HOSPITAL_BASED_OUTPATIENT_CLINIC_OR_DEPARTMENT_OTHER): Payer: 59

## 2014-09-03 ENCOUNTER — Ambulatory Visit (HOSPITAL_BASED_OUTPATIENT_CLINIC_OR_DEPARTMENT_OTHER): Payer: 59 | Admitting: Oncology

## 2014-09-03 VITALS — BP 147/75 | HR 75 | Temp 98.4°F | Resp 18 | Ht 74.0 in | Wt 229.6 lb

## 2014-09-03 DIAGNOSIS — C8370 Burkitt lymphoma, unspecified site: Secondary | ICD-10-CM

## 2014-09-03 DIAGNOSIS — D751 Secondary polycythemia: Secondary | ICD-10-CM

## 2014-09-03 DIAGNOSIS — Z8572 Personal history of non-Hodgkin lymphomas: Secondary | ICD-10-CM

## 2014-09-03 DIAGNOSIS — I10 Essential (primary) hypertension: Secondary | ICD-10-CM

## 2014-09-03 DIAGNOSIS — R7989 Other specified abnormal findings of blood chemistry: Secondary | ICD-10-CM

## 2014-09-03 DIAGNOSIS — R945 Abnormal results of liver function studies: Secondary | ICD-10-CM

## 2014-09-03 LAB — COMPREHENSIVE METABOLIC PANEL (CC13)
ALT: 27 U/L (ref 0–55)
ANION GAP: 6 meq/L (ref 3–11)
AST: 22 U/L (ref 5–34)
Albumin: 4 g/dL (ref 3.5–5.0)
Alkaline Phosphatase: 42 U/L (ref 40–150)
BILIRUBIN TOTAL: 1.26 mg/dL — AB (ref 0.20–1.20)
BUN: 15.6 mg/dL (ref 7.0–26.0)
CALCIUM: 8.8 mg/dL (ref 8.4–10.4)
CO2: 27 mEq/L (ref 22–29)
Chloride: 106 mEq/L (ref 98–109)
Creatinine: 1.1 mg/dL (ref 0.7–1.3)
EGFR: 77 mL/min/{1.73_m2} — ABNORMAL LOW (ref >90)
GLUCOSE: 112 mg/dL (ref 70–140)
Potassium: 4.5 mEq/L (ref 3.5–5.1)
Sodium: 138 mEq/L (ref 136–145)
TOTAL PROTEIN: 7 g/dL (ref 6.4–8.3)

## 2014-09-03 LAB — CBC WITH DIFFERENTIAL/PLATELET
BASO%: 0.5 % (ref 0.0–2.0)
Basophils Absolute: 0 10*3/uL (ref 0.0–0.1)
EOS%: 0.9 % (ref 0.0–7.0)
Eosinophils Absolute: 0.1 10*3/uL (ref 0.0–0.5)
HEMATOCRIT: 52.2 % — AB (ref 38.4–49.9)
HEMOGLOBIN: 17.2 g/dL — AB (ref 13.0–17.1)
LYMPH%: 17.1 % (ref 14.0–49.0)
MCH: 31.4 pg (ref 27.2–33.4)
MCHC: 33 g/dL (ref 32.0–36.0)
MCV: 95.1 fL (ref 79.3–98.0)
MONO#: 0.5 10*3/uL (ref 0.1–0.9)
MONO%: 6.2 % (ref 0.0–14.0)
NEUT#: 6 10*3/uL (ref 1.5–6.5)
NEUT%: 75.3 % — AB (ref 39.0–75.0)
PLATELETS: 133 10*3/uL — AB (ref 140–400)
RBC: 5.49 10*6/uL (ref 4.20–5.82)
RDW: 15.3 % — ABNORMAL HIGH (ref 11.0–14.6)
WBC: 8 10*3/uL (ref 4.0–10.3)
lymph#: 1.4 10*3/uL (ref 0.9–3.3)

## 2014-09-03 NOTE — Progress Notes (Signed)
  [name] OFFICE PROGRESS NOTE   Diagnosis: Burkitt's lymphoma  INTERVAL HISTORY:   [name] returns as scheduled. He feels well. Good appetite. No fever, night sweats, or bone pain. He injured his left knee getting out of a boat yesterday. He had 2 episodes of chest pain last month and was evaluated in the emergency room. He was referred to cardiology and is scheduled for a "stress test "later this week. He has a chronic rash at the neck and upper chest that he relates to testosterone therapy.  He was evaluated in the symptom management clinic on 05/08/2014 with a groin rash. The rash resolved after Diflucan.  Objective:  Vital signs in last 24 hours:  Blood pressure 147/75, pulse 75, temperature 98.4 F (36.9 C), temperature source Oral, resp. rate 18, height $RemoveBe'6\' 2"'tEAMkKeke$  (1.88 m), weight 229 lb 9.6 oz (104.146 kg), SpO2 99 %.    HEENT: Neck without mass Lymphatics: No cervical, supraclavicular, or inguinal nodes. Prominent bilateral axillary fat pads with a more discrete 2 cm soft mobile area in the left axilla. Resp: Lungs clear bilaterally Cardio: Regular rate and rhythm GI: No hepatosplenomegaly Vascular: No leg edema Musculoskeletal: Mild edema at the left knee  Skin: Acne type rash at the lower neck and upper chest     Lab Results:  Lab Results  Component Value Date   WBC 8.0 09/03/2014   HGB 17.2* 09/03/2014   HCT 52.2* 09/03/2014   MCV 95.1 09/03/2014   PLT 133* 09/03/2014   NEUTROABS 6.0 09/03/2014   Bilirubin 1.26 Medications: I have reviewed the patient's current medications.  Assessment/Plan: 1. Burkitt's lymphoma presenting in May 2007 with rapidly progressive pancytopenia, constitutional symptoms, progressive low back pain and migratory bone pain, low grade fever, hyperuricemia, and marked elevation of serum LDH to over 5000. Bone marrow biopsy was 100% cellular with 93% immature cells with light microscopy and clinical features all compatible  with Burkitt's lymphoma, but cytogenetic studies did not show the classic 8;14 translocation. He was treated on an aggressive protocol modeled after the cancer and leukemia group B with induction Cytoxan, vincristine, and prednisone, and then alternating cycles of ifosfamide, methotrexate, vincristine, Ara-C, and etoposide with Cytoxan, Adriamycin, and vincristine. Intrathecal chemotherapy with methotrexate and hydrocortisone and cranial radiation. He achieved a complete remission.  2. Polycythemia. Stable. JAK 2 mutation analysis 02/23/2011 negative for the JAK 2 [id number] mutation. Erythropoietin level 02/23/2011 mildly elevated at 37.2. Felt to likely be secondary to testosterone replacement. Stable. 3. History of mild elevation of bilirubin.  4. Essential hypertension. 5. History of elevated PSA. He has been evaluated by urology. 6. ADHD , taking Adderall. 7.    Disposition:  [name] remains in remission from Burkitt's lymphoma. He will return for an office visit in one year. He will contact us in the interim for new symptoms.  Betsy Coder, MD  09/03/2014  4:33 PM

## 2014-09-03 NOTE — Telephone Encounter (Signed)
Gave adn printed appt sched and avs fo rpt for Aug 2017

## 2014-09-05 ENCOUNTER — Telehealth (HOSPITAL_COMMUNITY): Payer: Self-pay

## 2014-09-05 NOTE — Telephone Encounter (Signed)
Encounter complete. 

## 2014-09-07 ENCOUNTER — Ambulatory Visit (HOSPITAL_COMMUNITY)
Admission: RE | Admit: 2014-09-07 | Discharge: 2014-09-07 | Disposition: A | Discharge status: Home / Self Care | Payer: 59 | Source: Ambulatory Visit | Attending: Cardiovascular Disease | Admitting: Cardiovascular Disease

## 2014-09-07 DIAGNOSIS — R079 Chest pain, unspecified: Secondary | ICD-10-CM | POA: Diagnosis not present

## 2014-09-07 LAB — MYOCARDIAL PERFUSION IMAGING
CHL CUP MPHR: 166 {beats}/min
CHL CUP NUCLEAR SDS: 1
CHL CUP RESTING HR STRESS: 72 {beats}/min
CHL RATE OF PERCEIVED EXERTION: 16
CSEPPBP: 75 mmHg
CSEPPHR: 126 {beats}/min
Estimated workload: 10.1 METS
Exercise duration (min): 9 min
LV sys vol: 59 mL
LVDIAVOL: 113 mL
Percent HR: 75 %
SRS: 0
SSS: 1
TID: 0.9

## 2014-09-07 MED ORDER — TECHNETIUM TC 99M SESTAMIBI GENERIC - CARDIOLITE
31.2000 | Freq: Once | INTRAVENOUS | Status: AC | PRN
  Administered 2014-09-07: 31.2 via INTRAVENOUS

## 2014-09-07 MED ORDER — TECHNETIUM TC 99M SESTAMIBI GENERIC - CARDIOLITE
10.6000 | Freq: Once | INTRAVENOUS | Status: AC | PRN
  Administered 2014-09-07: 11 via INTRAVENOUS

## 2014-09-10 ENCOUNTER — Telehealth: Payer: Self-pay | Admitting: *Deleted

## 2014-09-10 DIAGNOSIS — R079 Chest pain, unspecified: Secondary | ICD-10-CM

## 2014-09-10 NOTE — Telephone Encounter (Signed)
-----   Message from Lorretta Harp, MD sent at 09/07/2014  3:21 PM EDT ----- Low risk Myoview with mild LV dysfunction. Check 2-D echo for better assessment of LV function.

## 2014-09-21 ENCOUNTER — Ambulatory Visit (HOSPITAL_COMMUNITY): Payer: 59 | Attending: Cardiology

## 2014-09-21 ENCOUNTER — Other Ambulatory Visit: Payer: Self-pay

## 2014-09-21 DIAGNOSIS — R079 Chest pain, unspecified: Secondary | ICD-10-CM

## 2014-09-21 DIAGNOSIS — I10 Essential (primary) hypertension: Secondary | ICD-10-CM | POA: Diagnosis not present

## 2014-09-21 DIAGNOSIS — E785 Hyperlipidemia, unspecified: Secondary | ICD-10-CM | POA: Insufficient documentation

## 2014-09-21 DIAGNOSIS — I340 Nonrheumatic mitral (valve) insufficiency: Secondary | ICD-10-CM | POA: Diagnosis not present

## 2014-09-21 DIAGNOSIS — I351 Nonrheumatic aortic (valve) insufficiency: Secondary | ICD-10-CM | POA: Insufficient documentation

## 2014-10-02 ENCOUNTER — Ambulatory Visit: Payer: 59 | Admitting: Cardiovascular Disease

## 2014-10-16 ENCOUNTER — Ambulatory Visit (INDEPENDENT_AMBULATORY_CARE_PROVIDER_SITE_OTHER): Payer: 59 | Admitting: Neurology

## 2014-10-16 ENCOUNTER — Other Ambulatory Visit (INDEPENDENT_AMBULATORY_CARE_PROVIDER_SITE_OTHER): Payer: 59

## 2014-10-16 ENCOUNTER — Encounter: Payer: Self-pay | Admitting: Neurology

## 2014-10-16 VITALS — BP 124/80 | HR 84 | Ht 74.0 in | Wt 235.0 lb

## 2014-10-16 DIAGNOSIS — R51 Headache: Secondary | ICD-10-CM

## 2014-10-16 DIAGNOSIS — H532 Diplopia: Secondary | ICD-10-CM

## 2014-10-16 DIAGNOSIS — R519 Headache, unspecified: Secondary | ICD-10-CM

## 2014-10-16 LAB — TSH: TSH: 1.15 u[IU]/mL (ref 0.35–4.50)

## 2014-10-16 LAB — SEDIMENTATION RATE: SED RATE: 8 mm/h (ref 0–22)

## 2014-10-16 LAB — C-REACTIVE PROTEIN: CRP: 0.2 mg/dL — AB (ref 0.5–20.0)

## 2014-10-16 NOTE — Patient Instructions (Addendum)
1. Your provider has requested that you have labwork completed today. Please go to [facility] ([address]) on the second floor of this building before leaving the office today. You do not need to check in. If you are not called within 15 minutes please check with the front desk.  2. We have scheduled you at [facility] for your MRI/ MRA on 10/23/2014 at 3:00 pm. Please arrive 15 minutes prior and go to 1st floor radiology. If you need to reschedule for any reason please call [phone]. 3. Return to clinic 6 weeks

## 2014-10-16 NOTE — Progress Notes (Signed)
[facility] Clinic Note - Initial Visit   Date: 10/16/2014  TAEVYN HAUSEN MRN: [id number] DOB: Jun 29, 1960   Dear Dr. Wynetta Emery:  Thank you for your kind referral of SANTEZ WOODCOX for consultation of diplopia. Although his history is well known to you, please allow [place] to reiterate it for the purpose of our medical record. The patient was accompanied to the clinic by self.    History of Present Illness: VITALY WANAT is a 53 y.o. ambidextrous, predominately right-handed, Caucasian male with depression/axiety, hyperlipidemia, hypertension, secondary polycythemia, and Burkitt's lymphoma s/p chemotherapy and radiation (2007) now in remission presenting for evaluation of double vision.    On September 3rd, he woke up with double vision, with images appearing on top of each other.  When he went to bed the previous night, he was seeing things normally.  He reports symptoms have improved by ~25% over the past week.  Double vision is much worse when he is trying to read and with near vision.  He does not notice problems with long distance and when driving such as reading signs. It is completely resolved when he closes one eye.    Around the same time, he developed a bifrontal dull headache.  It lasted about 3 days and then improved with Goody's powder.  No further headaches since then.   There is no associated eye pain, blurry vision, color desaturation, facial paresthesias, slurred speech, swallowing difficulty, or limb weakness.  He has been taking aspirin 339m daily for a number of years.    Patient was evaluated by Dr. 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    Out-side paper records, electronic medical record, and images have been reviewed where available and summarized as:  MRI brain 12/02/2005:  Scattered small white matter lesions bilaterally which  are likely due to chronic ischemic change. No acute abnormality and negative for intracranial mass.  Lab Results  Component Value Date   ESRSEDRATE 1 08/28/2013     Past Medical History  Diagnosis Date  . HTN (hypertension), benign 08/31/2011  . Polycythemia, secondary 08/31/2011    Likely due to testosterone injections  . Abnormal liver function test 08/31/2011    Mild elevation bilirubin, normal/high hemoglobin, normal LDH  . Chest pain   . Burkitt's lymphoma     followed by Dr. BJulieanne Manson . Hyperlipidemia     Past Surgical History  Procedure Laterality Date  . Hernia repair    . Knee surgery Left      Medications:  Outpatient Encounter Prescriptions as of 10/16/2014  Medication Sig Note  . aspirin 325 MG tablet Take 325 mg by mouth daily.   .Marland KitchenbuPROPion (WELLBUTRIN XL) 300 MG 24 hr tablet Take 300 mg by mouth daily.  08/28/2013: Received from: External Pharmacy  . lisinopril (PRINIVIL,ZESTRIL) 40 MG tablet Take 40 mg by mouth daily.  08/11/2014: Received from: External Pharmacy  . omeprazole (PRILOSEC) 20 MG capsule Take 20 mg by mouth daily.   . rosuvastatin (CRESTOR) 5 MG tablet Take 5 mg by mouth daily.   . sildenafil (VIAGRA) 100 MG tablet Take 50 mg by mouth daily as needed for erectile dysfunction.    .Marland Kitchentestosterone cypionate (DEPOTESTOTERONE CYPIONATE) 200 MG/ML injection Inject 100 mg into the muscle every 14 (fourteen) days. Last injection 08/05/14   . [DISCONTINUED] dextroamphetamine (DEXTROSTAT) 5 MG tablet Take 5 mg by mouth 3 (  three) times daily.  05/08/2014: Received from: External Pharmacy Received Sig:   . [DISCONTINUED] Esomeprazole Magnesium (NEXIUM PO) Take 2 capsules by mouth daily.   . [DISCONTINUED] ibuprofen (ADVIL,MOTRIN) 200 MG tablet Take 400 mg by mouth daily as needed (pain).    No facility-administered encounter medications on file as of 10/16/2014.     Allergies:  Allergies  Allergen Reactions  . Vicodin [Hydrocodone-Acetaminophen] Nausea And  Vomiting    Family History: Family History  Problem Relation Age of Onset  . Stroke Father     Deceased  . Hypertension Mother     Living    Social History: Social History  Substance Use Topics  . Smoking status: Never Smoker   . Smokeless tobacco: Not on file  . Alcohol Use: 0.0 oz/week    0 Standard drinks or equivalent per week     Comment: once a week    Social History   Social History Narrative   Works as a Dealer for the [employer]    Married, no children.     Review of Systems:  CONSTITUTIONAL: No fevers, chills, night sweats, or weight loss.   EYES: +visual changes, no eye pain ENT: No hearing changes.  No history of nose bleeds.   RESPIRATORY: No cough, wheezing and shortness of breath.   CARDIOVASCULAR: Negative for chest pain, and palpitations.   GI: Negative for abdominal discomfort, blood in stools or black stools.  No recent change in bowel habits.   GU:  No history of incontinence.   MUSCLOSKELETAL: No history of joint pain or swelling.  No myalgias.   SKIN: Negative for lesions, rash, and itching.   HEMATOLOGY/ONCOLOGY: Negative for prolonged bleeding, bruising easily, and swollen nodes.  No history of cancer.   ENDOCRINE: Negative for cold or heat intolerance, polydipsia or goiter.   PSYCH:  No depression or anxiety symptoms.   NEURO: As Above.   Vital Signs:  BP 124/80 mmHg  Pulse 84  Ht _0  (1.88 m)  Wt 235 lb (106.595 kg)  BMI 30.16 kg/m2 Pain Scale: 0 on a scale of 0-10   General Medical Exam:   General:  Well appearing, comfortable.   Eyes/ENT: see cranial nerve examination.   Neck: No masses appreciated.  Full range of motion without tenderness.  No carotid bruits. Respiratory:  Clear to auscultation, good air entry bilaterally.   Cardiac:  Regular rate and rhythm, no murmur.   Extremities:  No deformities, edema, or skin discoloration.  Skin:  No rashes or lesions.  Neurological Exam: MENTAL STATUS including  orientation to time, place, person, recent and remote memory, attention span and concentration, language, and fund of knowledge is normal.  Speech is not dysarthric.  CRANIAL NERVES: II:  No visual field defects.  Unremarkable fundi.  Corrected visual acuity 20/20 OU. III-IV-VI: Pupils equal round and reactive to light.  Subtle right medial gaze restriction, otherwise all extracranial muscles appear intact.  Diplopia is worsened with end lateral gaze and downward gaze.  Normal conjugate, extra-ocular eye movements in all directions of gaze.  No nystagmus.  Mld right ptosis.    V:  Normal facial sensation.     VII:  Normal facial symmetry and movements.  No pathologic facial reflexes.  VIII:  Normal hearing and vestibular function.   IX-X:  Normal palatal movement.   XI:  Normal shoulder shrug and head rotation.   XII:  Normal tongue strength and range of motion, no deviation or fasciculation.  MOTOR:  No atrophy, fasciculations or abnormal movements.  No pronator drift.  Tone is normal.    Right Upper Extremity:    Left Upper Extremity:    Deltoid  5/5   Deltoid  5/5   Biceps  5/5   Biceps  5/5   Triceps  5/5   Triceps  5/5   Wrist extensors  5/5   Wrist extensors  5/5   Wrist flexors  5/5   Wrist flexors  5/5   Finger extensors  5/5   Finger extensors  5/5   Finger flexors  5/5   Finger flexors  5/5   Dorsal interossei  5/5   Dorsal interossei  5/5   Abductor pollicis  5/5   Abductor pollicis  5/5   Tone (Ashworth scale)  0  Tone (Ashworth scale)  0   Right Lower Extremity:    Left Lower Extremity:    Hip flexors  5/5   Hip flexors  5/5   Hip extensors  5/5   Hip extensors  5/5   Knee flexors  5/5   Knee flexors  5/5   Knee extensors  5/5   Knee extensors  5/5   Dorsiflexors  5/5   Dorsiflexors  5/5   Plantarflexors  5/5   Plantarflexors  5/5   Toe extensors  5/5   Toe extensors  5/5   Toe flexors  5/5   Toe flexors  5/5   Tone (Ashworth scale)  0  Tone (Ashworth scale)  0    MSRs:  Right                                                                 Left brachioradialis 2+  brachioradialis 2+  biceps 2+  biceps 2+  triceps 2+  triceps 2+  patellar 2+  patellar 2+  ankle jerk 2+  ankle jerk 2+  Hoffman no  Hoffman no  plantar response down  plantar response down   SENSORY:  Normal and symmetric perception of light touch, pinprick, vibration, and proprioception.  Romberg's sign absent.   COORDINATION/GAIT: Normal finger-to- nose-finger and heel-to-shin.  Intact rapid alternating movements bilaterally.  Able to rise from a chair without using arms.  Gait narrow based and stable. Tandem and stressed gait intact.    IMPRESSION: Mr. Vizcarrondo is a 54 year-old gentleman presenting for evaluation of sudden onset of monocular diplopia.  Exam is shows only very slight and subtle right medial gaze restriction.  There are no other cranial nerve deficits on exam.  Although there was concern of bilateral CN III palsy per her referring provider, I cannot appreciate this on my exam. With his fixed deficits, need to exclude structural lesion neverthless. Alternatively, ocular presentation for myasthenia also needs to be considered as there is mild right ptosis, but there is no diurnal variation, which would be expected in NMJ disorder.   PLAN/RECOMMENDATIONS:  1.  Check ESR, CRP, MG panel, TSH 2.  MRI brain, MRA head and neck   Return to clinic in 6 weeks.   The duration of this appointment visit was 40 minutes of face-to-face time with the patient.  Greater than 50% of this time was spent in counseling, explanation of diagnosis, planning of further management, and coordination of care.  Thank you for allowing me to participate in patient's care.  If I can answer any additional questions, I would be pleased to do so.    Sincerely,    Kasee Hantz K. Posey Pronto, DO

## 2014-10-17 NOTE — Progress Notes (Signed)
Note routed

## 2014-10-23 ENCOUNTER — Ambulatory Visit (HOSPITAL_COMMUNITY): Payer: 59

## 2014-10-23 ENCOUNTER — Ambulatory Visit (HOSPITAL_COMMUNITY)
Admission: RE | Admit: 2014-10-23 | Discharge: 2014-10-23 | Disposition: A | Discharge status: Home / Self Care | Payer: 59 | Source: Ambulatory Visit | Attending: Neurology | Admitting: Neurology

## 2014-10-23 DIAGNOSIS — Z923 Personal history of irradiation: Secondary | ICD-10-CM | POA: Diagnosis not present

## 2014-10-23 DIAGNOSIS — Z9221 Personal history of antineoplastic chemotherapy: Secondary | ICD-10-CM | POA: Insufficient documentation

## 2014-10-23 DIAGNOSIS — Z8572 Personal history of non-Hodgkin lymphomas: Secondary | ICD-10-CM | POA: Insufficient documentation

## 2014-10-23 DIAGNOSIS — I671 Cerebral aneurysm, nonruptured: Secondary | ICD-10-CM | POA: Insufficient documentation

## 2014-10-23 DIAGNOSIS — I729 Aneurysm of unspecified site: Secondary | ICD-10-CM

## 2014-10-23 DIAGNOSIS — H532 Diplopia: Secondary | ICD-10-CM | POA: Diagnosis present

## 2014-10-23 DIAGNOSIS — I10 Essential (primary) hypertension: Secondary | ICD-10-CM | POA: Diagnosis not present

## 2014-10-23 DIAGNOSIS — R9082 White matter disease, unspecified: Secondary | ICD-10-CM | POA: Insufficient documentation

## 2014-10-23 DIAGNOSIS — R51 Headache: Secondary | ICD-10-CM | POA: Diagnosis not present

## 2014-10-23 DIAGNOSIS — I639 Cerebral infarction, unspecified: Secondary | ICD-10-CM

## 2014-10-23 DIAGNOSIS — E785 Hyperlipidemia, unspecified: Secondary | ICD-10-CM | POA: Insufficient documentation

## 2014-10-23 DIAGNOSIS — R519 Headache, unspecified: Secondary | ICD-10-CM

## 2014-10-23 HISTORY — DX: Aneurysm of unspecified site: I72.9

## 2014-10-23 HISTORY — DX: Cerebral infarction, unspecified: I63.9

## 2014-10-23 HISTORY — DX: White matter disease, unspecified: R90.82

## 2014-10-23 LAB — CREATININE, SERUM: Creatinine, Ser: 1.21 mg/dL (ref 0.61–1.24)

## 2014-10-23 MED ORDER — GADOBENATE DIMEGLUMINE 529 MG/ML IV SOLN
20.0000 mL | Freq: Once | INTRAVENOUS | Status: AC | PRN
  Administered 2014-10-23: 20 mL via INTRAVENOUS

## 2014-10-24 ENCOUNTER — Telehealth: Payer: Self-pay | Admitting: Neurology

## 2014-10-24 LAB — MYASTHENIA GRAVIS PANEL 2
Acetylcholine Rec Binding: <0.3 nmol/L
Aceytlcholine Rec Bloc Ab: <15 % of inhibition (ref <15)

## 2014-10-24 NOTE — Telephone Encounter (Addendum)
Called and discussed results of lab work (myasthenia panel, TSH, inflammatory markers) as well as MRI/A brain with patient.  There is postradiation changes on his MRI brain and 58mm left anterior communicating artery aneurysm.  He endorses mild blurry vision, but his primary complaint has always been double vision. It would be unusual for Acom aneurym to manifest with double vision.  Nevertheless, because there is associated blurry vision and this may be potentially symptomatic, I will refer to endovascular specialist to see if there is a role for cerebral angiogram.  Following him clinically with serial CTA would be reasonable also.   Donika K. Posey Pronto, DO

## 2014-10-24 NOTE — Telephone Encounter (Signed)
Referral and notes faxed.

## 2014-11-02 ENCOUNTER — Other Ambulatory Visit: Payer: Self-pay | Admitting: *Deleted

## 2014-11-02 ENCOUNTER — Encounter: Payer: Self-pay | Admitting: Neurology

## 2014-11-02 DIAGNOSIS — R51 Headache: Secondary | ICD-10-CM

## 2014-11-02 DIAGNOSIS — H532 Diplopia: Secondary | ICD-10-CM

## 2014-11-02 DIAGNOSIS — R519 Headache, unspecified: Secondary | ICD-10-CM

## 2014-11-02 DIAGNOSIS — I729 Aneurysm of unspecified site: Secondary | ICD-10-CM

## 2014-11-05 ENCOUNTER — Ambulatory Visit: Payer: 59 | Admitting: Cardiology

## 2014-11-15 ENCOUNTER — Other Ambulatory Visit: Payer: Self-pay | Admitting: Neurosurgery

## 2014-11-15 ENCOUNTER — Other Ambulatory Visit (HOSPITAL_COMMUNITY): Payer: Self-pay | Admitting: Neurosurgery

## 2014-11-15 DIAGNOSIS — I729 Aneurysm of unspecified site: Secondary | ICD-10-CM

## 2014-11-23 ENCOUNTER — Other Ambulatory Visit (HOSPITAL_COMMUNITY): Payer: Self-pay | Admitting: Neurosurgery

## 2014-11-23 ENCOUNTER — Ambulatory Visit (HOSPITAL_COMMUNITY)
Admission: RE | Admit: 2014-11-23 | Discharge: 2014-11-23 | Disposition: A | Discharge status: Home / Self Care | Payer: 59 | Source: Ambulatory Visit | Attending: Neurosurgery | Admitting: Neurosurgery

## 2014-11-23 DIAGNOSIS — E785 Hyperlipidemia, unspecified: Secondary | ICD-10-CM | POA: Insufficient documentation

## 2014-11-23 DIAGNOSIS — I729 Aneurysm of unspecified site: Secondary | ICD-10-CM

## 2014-11-23 DIAGNOSIS — I10 Essential (primary) hypertension: Secondary | ICD-10-CM | POA: Insufficient documentation

## 2014-11-23 DIAGNOSIS — H532 Diplopia: Secondary | ICD-10-CM | POA: Insufficient documentation

## 2014-11-23 DIAGNOSIS — Z7982 Long term (current) use of aspirin: Secondary | ICD-10-CM | POA: Insufficient documentation

## 2014-11-23 DIAGNOSIS — R51 Headache: Secondary | ICD-10-CM | POA: Insufficient documentation

## 2014-11-23 DIAGNOSIS — I671 Cerebral aneurysm, nonruptured: Secondary | ICD-10-CM | POA: Diagnosis not present

## 2014-11-23 HISTORY — PX: CEREBRAL ANGIOGRAM: SHX1326

## 2014-11-23 LAB — COMPREHENSIVE METABOLIC PANEL
ALBUMIN: 3.9 g/dL (ref 3.5–5.0)
ALK PHOS: 37 U/L — AB (ref 38–126)
ALT: 34 U/L (ref 17–63)
AST: 27 U/L (ref 15–41)
Anion gap: 7 (ref 5–15)
BILIRUBIN TOTAL: 1.1 mg/dL (ref 0.3–1.2)
BUN: 17 mg/dL (ref 6–20)
CALCIUM: 9.3 mg/dL (ref 8.9–10.3)
CO2: 25 mmol/L (ref 22–32)
Chloride: 107 mmol/L (ref 101–111)
Creatinine, Ser: 1.22 mg/dL (ref 0.61–1.24)
GFR calc Af Amer: >60 mL/min (ref >60)
GFR calc non Af Amer: >60 mL/min (ref >60)
GLUCOSE: 107 mg/dL — AB (ref 65–99)
POTASSIUM: 4.6 mmol/L (ref 3.5–5.1)
Sodium: 139 mmol/L (ref 135–145)
TOTAL PROTEIN: 6.8 g/dL (ref 6.5–8.1)

## 2014-11-23 LAB — CBC WITH DIFFERENTIAL/PLATELET
BASOS ABS: 0 10*3/uL (ref 0.0–0.1)
BASOS PCT: 0 %
Eosinophils Absolute: 0.1 10*3/uL (ref 0.0–0.7)
Eosinophils Relative: 1 %
HEMATOCRIT: 53.9 % — AB (ref 39.0–52.0)
HEMOGLOBIN: 18.3 g/dL — AB (ref 13.0–17.0)
Lymphocytes Relative: 19 %
Lymphs Abs: 1.3 10*3/uL (ref 0.7–4.0)
MCH: 32.2 pg (ref 26.0–34.0)
MCHC: 34 g/dL (ref 30.0–36.0)
MCV: 94.7 fL (ref 78.0–100.0)
Monocytes Absolute: 0.4 10*3/uL (ref 0.1–1.0)
Monocytes Relative: 6 %
NEUTROS ABS: 5.1 10*3/uL (ref 1.7–7.7)
NEUTROS PCT: 74 %
Platelets: 143 10*3/uL — ABNORMAL LOW (ref 150–400)
RBC: 5.69 MIL/uL (ref 4.22–5.81)
RDW: 13.8 % (ref 11.5–15.5)
WBC: 7 10*3/uL (ref 4.0–10.5)

## 2014-11-23 LAB — PROTIME-INR
INR: 1.06 (ref 0.00–1.49)
Prothrombin Time: 14 seconds (ref 11.6–15.2)

## 2014-11-23 LAB — APTT: APTT: 26 s (ref 24–37)

## 2014-11-23 MED ORDER — SODIUM CHLORIDE 0.9 % IV SOLN: INTRAVENOUS | Status: DC

## 2014-11-23 MED ORDER — IOHEXOL 300 MG/ML  SOLN
100.0000 mL | Freq: Once | INTRAMUSCULAR | Status: DC | PRN
Start: 2014-11-23 — End: 2014-11-24
  Administered 2014-11-23: 65 mL via INTRA_ARTERIAL
  Filled 2014-11-23: qty 100

## 2014-11-23 MED ORDER — SODIUM CHLORIDE 0.9 % IV SOLN
INTRAVENOUS | Status: AC | PRN
  Administered 2014-11-23: 10 mL/h via INTRAVENOUS

## 2014-11-23 MED ORDER — ACETAMINOPHEN-CODEINE #3 300-30 MG PO TABS: 1.0000 | ORAL_TABLET | Freq: Four times a day (QID) | ORAL | Status: DC | PRN

## 2014-11-23 MED ORDER — FENTANYL CITRATE (PF) 100 MCG/2ML IJ SOLN
INTRAMUSCULAR | Status: AC
  Filled 2014-11-23: qty 2

## 2014-11-23 MED ORDER — HEPARIN SODIUM (PORCINE) 1000 UNIT/ML IJ SOLN
INTRAMUSCULAR | Status: AC | PRN
  Administered 2014-11-23: 2000 [IU] via INTRAVENOUS

## 2014-11-23 MED ORDER — FENTANYL CITRATE (PF) 100 MCG/2ML IJ SOLN
INTRAMUSCULAR | Status: AC | PRN
  Administered 2014-11-23: 50 ug via INTRAVENOUS

## 2014-11-23 MED ORDER — MIDAZOLAM HCL 2 MG/2ML IJ SOLN
INTRAMUSCULAR | Status: AC
  Filled 2014-11-23: qty 2

## 2014-11-23 MED ORDER — MIDAZOLAM HCL 2 MG/2ML IJ SOLN
INTRAMUSCULAR | Status: AC | PRN
  Administered 2014-11-23: 1 mg via INTRAVENOUS

## 2014-11-23 MED ORDER — LIDOCAINE HCL 1 % IJ SOLN
INTRAMUSCULAR | Status: AC
  Filled 2014-11-23: qty 20

## 2014-11-23 MED ORDER — HEPARIN SODIUM (PORCINE) 1000 UNIT/ML IJ SOLN
INTRAMUSCULAR | Status: AC
  Filled 2014-11-23: qty 1

## 2014-11-23 NOTE — Sedation Documentation (Signed)
Patient is resting comfortably. 

## 2014-11-23 NOTE — Sedation Documentation (Signed)
5 Fr. exoseal applied right groin

## 2014-11-23 NOTE — Op Note (Signed)
DIAGNOSTIC CEREBRAL ANGIOGRAM    OPERATOR:   Dr. Consuella Lose, MD  HISTORY:   The patient is a 54 y.o. yo male initially presenting with diplopia who was also incidentally found to have an Acom aneurysm. He presents for further w/u with diagnostic cerebral angiogram.  APPROACH:   The technical aspects of the procedure as well as its potential risks and benefits were reviewed with the patient. These risks included but were not limited bleeding, infection, allergic reaction, damage to organs/vital structures, stroke, non-diagnostic procedure, and the catastrophic outcomes of heart attack, coma, and death. With an understanding of these risks, informed consent was obtained and witnessed.    The patient was placed in the supine position on the angiography table and the skin of right groin prepped in the usual sterile fashion. The procedure was performed under local anesthesia (1%-solution of bicarbonate-bufferred Lidoacaine) and conscious sedation with Versed and fentanyl monitored by the in-suite nurse.    A 5- French sheath was introduced in the right common femoral artery using Seldinger technique.  A fluorophase sequence was used to document the sheath position.    HEPARIN: 2000 Units total.   CONTRAST AGENT: 70cc, Omnipaque 300   FLUOROSCOPY TIME: See IR records  CATHETER(S) AND WIRE(S):    5-French JB-1 glidecatheter   5-French Faustino Congress catheter 0.035" glidewire    VESSELS CATHETERIZED:   Right internal carotid   Left common carotid   Left internal carotid Left vertebral   Right common femoral  VESSELS STUDIED:   Right internal carotid, head Left common carotid, neck Left common carotid, head Left vertebral Right femoral  PROCEDURAL NARRATIVE:   A 5-Fr JB-1 terumo glide catheter was advanced over a 0.035 glidewire into the aortic arch. The right internal, left internal carotid arteries were then sequentially catheterized and cervical/cerebral angiograms taken. After  review of images, the catheter was removed without incident.  The Rosana Hoes was introduced and the left vertebral was catheterized. Cerebral angiograms were taken and the catheter removed.  INTERPRETATION:   Right internal carotid: head:   Injection reveals the presence of a widely patent ICA, M1, and A1 segments and their branches. There is no significant stenosis, occlusion, aneurysm or high flow vascular malformation visualized.  Note is made of a fetal-type right PCA. The parenchymal and venous phases are normal. The venous sinuses are widely patent.    Left common carotid: neck:   There is no significant stenosis, occlusion, aneurysm or plaque visualized on this injection.    Left internal carotid: head:   Injection reveals the presence of a widely patent ICA, A1, and M1 segments and their branches. The Acom is fenestrated, and there is a superiorly and rightward projecting aneurysm at the  Junction of the left ACA and the distal Acom. This measures 3.4 x 3.76mm. The parenchymal and venous phases are normal. The venous sinuses are widely patent.    Left vertebral:   Injection reveals the presence of a widely patent vertebral artery. This leads to a widely patent basilar artery that terminates in a left P1. The right P1 is hypoplastic. The basilar apex is normal. There is no significant stenosis, occlusion, aneurysm, or vascular malformation visualized. There is contrast reflux down the contralateral vertebral which appears non-dominant. The vertebrobasilar junction is normal. The parenchymal and venous phases are normal. The venous sinuses are widely patent.    Right femoral:    Normal vessel. No significant atherosclerotic disease. Arterial sheath in adequate position.   DISPOSITION:  Upon completion  of the study, the femoral sheath was removed and hemostasis obtained using a 5-Fr ExoSeal closure device. Good proximal and distal lower extremity pulses were documented upon achievement of  hemostasis.    The procedure was well tolerated and no early complications were observed.       The patient was transferred back to the holding area to be positioned flat in bed for 3 hours of observation.    IMPRESSION:  1. 3.2mm left Acom aneurysm as described above. No other aneurysms, AVM, or high-flow fistulas are seen.  The preliminary results of this procedure were shared with the patient and the patient's family.

## 2014-11-23 NOTE — H&P (Signed)
CC:  Double vision  HPI: Mr. Suit is a 54 year old man I'm seeing with a primary complaint of double vision which started back in September. There was no particular inciting event for the double vision. He says he sees double whenever both his eyes are open, however has normal vision whenever he closes either the left or right eye. The double vision is worse when looking laterally to the left or the right. He also says it is worse looking at things that are close to his face versus farther away. Over the last several weeks, the double vision has improved a little bit, but it persists. Initially, he did have some headache associated with the vision, but the headache is now completely resolved. He has not noted any numbness tingling or weakness of the arms or legs.   Of note, the patient does have a history of hypertension, no history of smoking, and no family history of intracranial hemorrhage or aneurysms. He does also have a history of Burkitt's lymphoma, now in remission.   PMH: Past Medical History  Diagnosis Date  . HTN (hypertension), benign 08/31/2011  . Polycythemia, secondary 08/31/2011    Likely due to testosterone injections  . Abnormal liver function test 08/31/2011    Mild elevation bilirubin, normal/high hemoglobin, normal LDH  . Chest pain   . Burkitt's lymphoma     followed by Dr. Julieanne Manson  . Hyperlipidemia     PSH: Past Surgical History  Procedure Laterality Date  . Hernia repair    . Knee surgery Left     SH: Social History  Substance Use Topics  . Smoking status: Never Smoker   . Smokeless tobacco: Not on file  . Alcohol Use: 0.0 oz/week    0 Standard drinks or equivalent per week     Comment: once a week     MEDS: Prior to Admission medications   Medication Sig Start Date End Date Taking? Authorizing Provider  aspirin 325 MG tablet Take 325 mg by mouth daily.   Yes Historical Provider, MD  buPROPion (WELLBUTRIN XL) 300 MG 24 hr tablet Take 300 mg by  mouth daily.  08/10/13  Yes Historical Provider, MD  dextroamphetamine (DEXTROSTAT) 5 MG tablet Take 5 mg by mouth 3 (three) times daily.   Yes Historical Provider, MD  lisinopril (PRINIVIL,ZESTRIL) 40 MG tablet Take 40 mg by mouth daily.  08/07/14  Yes Historical Provider, MD  omeprazole (PRILOSEC) 20 MG capsule Take 20 mg by mouth daily.   Yes Historical Provider, MD  rosuvastatin (CRESTOR) 5 MG tablet Take 5 mg by mouth daily.   Yes Historical Provider, MD  sildenafil (VIAGRA) 100 MG tablet Take 100 mg by mouth daily as needed for erectile dysfunction.    Yes Historical Provider, MD  testosterone cypionate (DEPOTESTOTERONE CYPIONATE) 200 MG/ML injection Inject 150 mg into the muscle every 14 (fourteen) days. Last injection 08/05/14   Yes Historical Provider, MD    ALLERGY: Allergies  Allergen Reactions  . Vicodin [Hydrocodone-Acetaminophen] Nausea And Vomiting    ROS: ROS  NEUROLOGIC EXAM: Awake, alert, oriented Memory and concentration grossly intact Speech fluent, appropriate CN grossly intact Motor exam: Upper Extremities Deltoid Bicep Tricep Grip  Right 5/5 5/5 5/5 5/5  Left 5/5 5/5 5/5 5/5   Lower Extremity IP Quad PF DF EHL  Right 5/5 5/5 5/5 5/5 5/5  Left 5/5 5/5 5/5 5/5 5/5   Sensation grossly intact to LT  IMGAING: MRI and MRA of the brain were reviewed. Significant finding  is a left A1 A2 junction aneurysm measuring approximately 3-4 mm. Note is made of the fenestrated Acom  IMPRESSION: 54 year old man with incidental discovery of an ACA aneurysm on the left, not responsible for his presenting complaint of diplopia. It is unclear what the etiology of the diplopia is, however it appears to be slowly improving.  PLAN: - Proceed with diagnostic cerebral angiogram.   I did review the MRI and MRA findings with the patient and his wife in the office. I explained to them that the aneurysm was incidental, and not responsible for his double vision. I do think further workup  would be warranted as the aneurysm is of the size that is potentially treatable, especially given his young age. The natural history of cerebral aneurysms was reviewed in detail, with a 1% per year risk of rupture. The general treatment options were also discussed, with the need for definitive diagnosis by catheter angiogram. The risks of the angiogram procedure were reviewed, including a 0.1% risk of stroke, and overal risk of approximately 1% including but not limited to groin hematoma, headache, contrast reaction, and nephropathy.   The patient and his wife understood our discussion and are willing to proceed as above.

## 2014-11-23 NOTE — Discharge Instructions (Signed)
Cerebral Angiogram, Care After Refer to this sheet in the next few weeks. These instructions provide you with information on caring for yourself after your procedure. Your health care provider may also give you more specific instructions. Your treatment has been planned according to current medical practices, but problems sometimes occur. Call your health care provider if you have any problems or questions after your procedure. WHAT TO EXPECT AFTER THE PROCEDURE After your procedure, it is typical to have the following:  Bruising at the catheter insertion site that usually fades within 1-2 weeks.  Blood collecting in the tissue (hematoma) that may be painful to the touch. It should usually decrease in size and tenderness within 1-2 weeks.  A mild headache. HOME CARE INSTRUCTIONS  Take medicines only as directed by your health care provider.  You may shower 24-48 hours after the procedure or as directed by your health care provider. Remove the bandage (dressing) and gently wash the site with plain soap and water. Pat the area dry with a clean towel. Do not rub the site, because this may cause bleeding.  Do not take baths, swim, or use a hot tub until your health care provider approves.  Check your insertion site every day for redness, swelling, or drainage.  Do not apply powder or lotion to the site.  Do not lift over 10 lb (4.5 kg) for 5 days after your procedure or as directed by your health care provider.  Ask your health care provider when it is okay to:  Return to work or school.  Resume usual physical activities or sports.  Resume sexual activity.  Do not drive home if you are discharged the same day as the procedure. Have someone else drive you.  You may drive 24 hours after the procedure unless otherwise instructed by your health care provider.  Do not operate machinery or power tools for 24 hours after the procedure or as directed by your health care provider.  If your  procedure was done as an outpatient procedure, which means that you went home the same day as your procedure, a responsible adult should be with you for the first 24 hours after you arrive home.  Keep all follow-up visits as directed by your health care provider. This is important. SEEK MEDICAL CARE IF:  You have a fever.  You have chills.  You have increased bleeding from the catheter insertion site. Hold pressure on the site. SEEK IMMEDIATE MEDICAL CARE IF:  You have vision changes or loss of vision.  You have numbness or weakness on one side of your body.  You have difficulty talking, or you have slurred speech or cannot speak (aphasia).  You feel confused or have difficulty remembering.  You have unusual pain at the catheter insertion site.  You have redness, warmth, or swelling at the catheter insertion site.  You have drainage (other than a small amount of blood on the dressing) from the catheter insertion site.  The catheter insertion site is bleeding, and the bleeding does not stop after 30 minutes of holding steady pressure on the site. These symptoms may represent a serious problem that is an emergency. Do not wait to see if the symptoms will go away. Get medical help right away. Call your local emergency services (911 in [place] Do not drive yourself to the hospital.   This information is not intended to replace advice given to you by your health care provider. Make sure you discuss any questions you have with your  health care provider.   Document Released: 06/05/2013 Document Revised: 11/07/2013 Document Reviewed: 06/05/2013 Elsevier Interactive Patient Education [employer].

## 2014-11-30 ENCOUNTER — Ambulatory Visit: Payer: 59 | Admitting: Neurology

## 2015-09-09 ENCOUNTER — Ambulatory Visit (HOSPITAL_BASED_OUTPATIENT_CLINIC_OR_DEPARTMENT_OTHER): Payer: 59 | Admitting: Nurse Practitioner

## 2015-09-09 ENCOUNTER — Other Ambulatory Visit (HOSPITAL_BASED_OUTPATIENT_CLINIC_OR_DEPARTMENT_OTHER): Payer: 59

## 2015-09-09 VITALS — BP 147/90 | HR 81 | Temp 98.3°F | Resp 18 | Ht 73.0 in | Wt 233.9 lb

## 2015-09-09 DIAGNOSIS — C8370 Burkitt lymphoma, unspecified site: Secondary | ICD-10-CM

## 2015-09-09 DIAGNOSIS — Z8572 Personal history of non-Hodgkin lymphomas: Secondary | ICD-10-CM

## 2015-09-09 DIAGNOSIS — H409 Unspecified glaucoma: Secondary | ICD-10-CM

## 2015-09-09 DIAGNOSIS — I10 Essential (primary) hypertension: Secondary | ICD-10-CM | POA: Diagnosis not present

## 2015-09-09 DIAGNOSIS — D751 Secondary polycythemia: Secondary | ICD-10-CM | POA: Diagnosis not present

## 2015-09-09 LAB — COMPREHENSIVE METABOLIC PANEL
ALBUMIN: 4 g/dL (ref 3.5–5.0)
ALK PHOS: 44 U/L (ref 40–150)
ALT: 30 U/L (ref 0–55)
AST: 21 U/L (ref 5–34)
Anion Gap: 10 mEq/L (ref 3–11)
BILIRUBIN TOTAL: 1.62 mg/dL — AB (ref 0.20–1.20)
BUN: 17.9 mg/dL (ref 7.0–26.0)
CO2: 26 meq/L (ref 22–29)
CREATININE: 1.1 mg/dL (ref 0.7–1.3)
Calcium: 10 mg/dL (ref 8.4–10.4)
Chloride: 102 mEq/L (ref 98–109)
EGFR: 78 mL/min/{1.73_m2} — AB (ref >90)
GLUCOSE: 93 mg/dL (ref 70–140)
Potassium: 4.6 mEq/L (ref 3.5–5.1)
SODIUM: 138 meq/L (ref 136–145)
TOTAL PROTEIN: 7.6 g/dL (ref 6.4–8.3)

## 2015-09-09 LAB — CBC WITH DIFFERENTIAL/PLATELET
BASO%: 0.5 % (ref 0.0–2.0)
Basophils Absolute: 0.1 10*3/uL (ref 0.0–0.1)
EOS ABS: 0.1 10*3/uL (ref 0.0–0.5)
EOS%: 0.7 % (ref 0.0–7.0)
HCT: 54.7 % — ABNORMAL HIGH (ref 38.4–49.9)
HEMOGLOBIN: 18.3 g/dL — AB (ref 13.0–17.1)
LYMPH%: 14.6 % (ref 14.0–49.0)
MCH: 31.4 pg (ref 27.2–33.4)
MCHC: 33.4 g/dL (ref 32.0–36.0)
MCV: 94.1 fL (ref 79.3–98.0)
MONO#: 0.6 10*3/uL (ref 0.1–0.9)
MONO%: 5.7 % (ref 0.0–14.0)
NEUT%: 78.5 % — ABNORMAL HIGH (ref 39.0–75.0)
NEUTROS ABS: 7.6 10*3/uL — AB (ref 1.5–6.5)
Platelets: 131 10*3/uL — ABNORMAL LOW (ref 140–400)
RBC: 5.82 10*6/uL (ref 4.20–5.82)
RDW: 14.6 % (ref 11.0–14.6)
WBC: 9.7 10*3/uL (ref 4.0–10.3)
lymph#: 1.4 10*3/uL (ref 0.9–3.3)

## 2015-09-09 LAB — LACTATE DEHYDROGENASE: LDH: 195 U/L (ref 125–245)

## 2015-09-09 NOTE — Progress Notes (Signed)
  [facility] OFFICE PROGRESS NOTE   Diagnosis:  Burkitt's lymphoma  INTERVAL HISTORY:   [name] returns as scheduled. He feels well. He reports being diagnosed with glaucoma and a brain aneurysm since his last visit. No fevers or sweats. He reports a good appetite. Weight is stable. No enlarged lymph nodes.  Objective:  Vital signs in last 24 hours:  Blood pressure (!) 147/90, pulse 81, temperature 98.3 F (36.8 C), temperature source Oral, resp. rate 18, height '6\' 1"'$  (1.854 m), weight 233 lb 14.4 oz (106.1 kg), SpO2 97 %.    HEENT: No thrush or ulcers. Lymphatics: No palpable cervical, supraclavicular, right axillary or inguinal lymph nodes. Approximate 2 cm mobile left axillary lymph node. Resp: Lungs clear bilaterally. Cardio: Regular rate and rhythm. GI: Abdomen soft and nontender. No organomegaly. Vascular: No leg edema.    Lab Results:  Lab Results  Component Value Date   WBC 9.7 09/09/2015   HGB 18.3 (H) 09/09/2015   HCT 54.7 (H) 09/09/2015   MCV 94.1 09/09/2015   PLT 131 (L) 09/09/2015   NEUTROABS 7.6 (H) 09/09/2015    Imaging:  No results found.  Medications: I have reviewed the patient's current medications.  Assessment/Plan: 1. Burkitt's lymphoma presenting in May 2007 with rapidly progressive pancytopenia, constitutional symptoms, progressive low back pain and migratory bone pain, low grade fever, hyperuricemia, and marked elevation of serum LDH to over 5000. Bone marrow biopsy was 100% cellular with 93% immature cells with light microscopy and clinical features all compatible with Burkitt's lymphoma, but cytogenetic studies did not show the classic 8;14 translocation. He was treated on an aggressive protocol modeled after the cancer and leukemia group B with induction Cytoxan, vincristine, and prednisone, and then alternating cycles of ifosfamide, methotrexate, vincristine, Ara-C, and etoposide with Cytoxan, Adriamycin, and vincristine.  Intrathecal chemotherapy with methotrexate and hydrocortisone and cranial radiation. He achieved a complete remission.  2. Polycythemia. Stable. JAK 2 mutation analysis 02/23/2011 negative for the JAK 2 V617F mutation. Erythropoietin level 02/23/2011 mildly elevated at 37.2. Felt to likely be secondary to testosterone replacement. Stable. 3. History of mild elevation of bilirubin.  4. Essential hypertension. 5. History of elevated PSA. He has been evaluated by urology. 6. ADHD , taking Adderall. 7. Glaucoma   Disposition:[name] remains in remission from Burkitt's lymphoma. He will return for a follow-up visit in one year. He will contact the office in the interim with any problems.    Ned Card ANP/GNP-BC   09/09/2015  3:37 PM

## 2015-09-12 ENCOUNTER — Telehealth: Payer: Self-pay | Admitting: *Deleted

## 2015-09-12 NOTE — Telephone Encounter (Signed)
Per POF I have scheduled appt and mailed calendar.

## 2015-09-23 MED ORDER — PROPOFOL 10 MG/ML IV BOLUS
INTRAVENOUS | Status: AC
  Filled 2015-09-23: qty 20

## 2015-12-04 DIAGNOSIS — I82409 Acute embolism and thrombosis of unspecified deep veins of unspecified lower extremity: Secondary | ICD-10-CM

## 2015-12-04 HISTORY — DX: Acute embolism and thrombosis of unspecified deep veins of unspecified lower extremity: I82.409

## 2016-03-27 DIAGNOSIS — D751 Secondary polycythemia: Secondary | ICD-10-CM | POA: Diagnosis not present

## 2016-03-27 DIAGNOSIS — Z1389 Encounter for screening for other disorder: Secondary | ICD-10-CM | POA: Diagnosis not present

## 2016-03-27 DIAGNOSIS — I8290 Acute embolism and thrombosis of unspecified vein: Secondary | ICD-10-CM | POA: Diagnosis not present

## 2016-03-27 DIAGNOSIS — E291 Testicular hypofunction: Secondary | ICD-10-CM | POA: Diagnosis not present

## 2016-03-27 DIAGNOSIS — R7309 Other abnormal glucose: Secondary | ICD-10-CM | POA: Diagnosis not present

## 2016-03-27 DIAGNOSIS — L989 Disorder of the skin and subcutaneous tissue, unspecified: Secondary | ICD-10-CM | POA: Diagnosis not present

## 2016-06-08 DIAGNOSIS — I8290 Acute embolism and thrombosis of unspecified vein: Secondary | ICD-10-CM | POA: Diagnosis not present

## 2016-06-08 DIAGNOSIS — D751 Secondary polycythemia: Secondary | ICD-10-CM | POA: Diagnosis not present

## 2016-06-08 DIAGNOSIS — E298 Other testicular dysfunction: Secondary | ICD-10-CM | POA: Diagnosis not present

## 2016-06-26 DIAGNOSIS — E298 Other testicular dysfunction: Secondary | ICD-10-CM | POA: Diagnosis not present

## 2016-07-27 ENCOUNTER — Other Ambulatory Visit: Payer: Self-pay | Admitting: Nurse Practitioner

## 2016-07-27 ENCOUNTER — Ambulatory Visit
Admission: RE | Admit: 2016-07-27 | Discharge: 2016-07-27 | Disposition: A | Discharge status: Home / Self Care | Payer: Worker's Compensation | Source: Ambulatory Visit | Attending: Nurse Practitioner | Admitting: Nurse Practitioner

## 2016-07-27 DIAGNOSIS — M25512 Pain in left shoulder: Secondary | ICD-10-CM

## 2016-07-31 DIAGNOSIS — H401131 Primary open-angle glaucoma, bilateral, mild stage: Secondary | ICD-10-CM | POA: Diagnosis not present

## 2016-09-08 ENCOUNTER — Other Ambulatory Visit (HOSPITAL_BASED_OUTPATIENT_CLINIC_OR_DEPARTMENT_OTHER): Payer: 59

## 2016-09-08 ENCOUNTER — Ambulatory Visit (HOSPITAL_BASED_OUTPATIENT_CLINIC_OR_DEPARTMENT_OTHER): Payer: 59 | Admitting: Nurse Practitioner

## 2016-09-08 ENCOUNTER — Telehealth: Payer: Self-pay | Admitting: Nurse Practitioner

## 2016-09-08 VITALS — BP 160/94 | HR 81 | Temp 98.0°F | Resp 18 | Ht 73.0 in | Wt 240.2 lb

## 2016-09-08 DIAGNOSIS — D751 Secondary polycythemia: Secondary | ICD-10-CM

## 2016-09-08 DIAGNOSIS — C8370 Burkitt lymphoma, unspecified site: Secondary | ICD-10-CM

## 2016-09-08 DIAGNOSIS — I10 Essential (primary) hypertension: Secondary | ICD-10-CM | POA: Diagnosis not present

## 2016-09-08 DIAGNOSIS — Z7901 Long term (current) use of anticoagulants: Secondary | ICD-10-CM | POA: Diagnosis not present

## 2016-09-08 DIAGNOSIS — Z86718 Personal history of other venous thrombosis and embolism: Secondary | ICD-10-CM

## 2016-09-08 LAB — CBC WITH DIFFERENTIAL/PLATELET
BASO%: 0.6 % (ref 0.0–2.0)
BASOS ABS: 0 10*3/uL (ref 0.0–0.1)
EOS%: 1.1 % (ref 0.0–7.0)
Eosinophils Absolute: 0.1 10*3/uL (ref 0.0–0.5)
HEMATOCRIT: 51.3 % — AB (ref 38.4–49.9)
HGB: 17.2 g/dL — ABNORMAL HIGH (ref 13.0–17.1)
LYMPH#: 1.3 10*3/uL (ref 0.9–3.3)
LYMPH%: 17.6 % (ref 14.0–49.0)
MCH: 31.4 pg (ref 27.2–33.4)
MCHC: 33.4 g/dL (ref 32.0–36.0)
MCV: 94 fL (ref 79.3–98.0)
MONO#: 0.5 10*3/uL (ref 0.1–0.9)
MONO%: 6.2 % (ref 0.0–14.0)
NEUT#: 5.7 10*3/uL (ref 1.5–6.5)
NEUT%: 74.5 % (ref 39.0–75.0)
Platelets: 156 10*3/uL (ref 140–400)
RBC: 5.46 10*6/uL (ref 4.20–5.82)
RDW: 15.2 % — ABNORMAL HIGH (ref 11.0–14.6)
WBC: 7.6 10*3/uL (ref 4.0–10.3)

## 2016-09-08 LAB — COMPREHENSIVE METABOLIC PANEL
ALT: 34 U/L (ref 0–55)
ANION GAP: 8 meq/L (ref 3–11)
AST: 29 U/L (ref 5–34)
Albumin: 4.1 g/dL (ref 3.5–5.0)
Alkaline Phosphatase: 43 U/L (ref 40–150)
BUN: 16.2 mg/dL (ref 7.0–26.0)
CALCIUM: 9.9 mg/dL (ref 8.4–10.4)
CHLORIDE: 104 meq/L (ref 98–109)
CO2: 26 mEq/L (ref 22–29)
Creatinine: 1.1 mg/dL (ref 0.7–1.3)
EGFR: 73 mL/min/{1.73_m2} — ABNORMAL LOW (ref >90)
Glucose: 111 mg/dl (ref 70–140)
POTASSIUM: 4.1 meq/L (ref 3.5–5.1)
Sodium: 138 mEq/L (ref 136–145)
Total Bilirubin: 1.53 mg/dL — ABNORMAL HIGH (ref 0.20–1.20)
Total Protein: 7.2 g/dL (ref 6.4–8.3)

## 2016-09-08 LAB — LACTATE DEHYDROGENASE: LDH: 214 U/L (ref 125–245)

## 2016-09-08 NOTE — Progress Notes (Addendum)
  [facility] OFFICE PROGRESS NOTE   Diagnosis:  Burkitt's lymphoma  INTERVAL HISTORY:   Mr. Baruch returns as scheduled. He feels well. No fevers or sweats. Good appetite. No enlarged lymph nodes. He reports being diagnosed with a left leg DVT in November 2017. He is on Xarelto. He denies bleeding.  Objective:  Vital signs in last 24 hours:  Blood pressure (!) 160/94, pulse 81, temperature 98 F (36.7 C), temperature source Oral, resp. rate 18, height _0  (1.854 m), weight 240 lb 3.2 oz (109 kg), SpO2 97 %.    HEENT: No obvious mass within the oral cavity. Lymphatics: 1 cm right low lateral neck lymph node. Question tiny left lateral neck lymph node. Approximate 2 cm left axillary lymph node. No palpable supraclavicular, right axillary or inguinal lymph nodes. Resp: Lungs clear bilaterally. Cardio: Regular rate and rhythm. GI: Abdomen soft and nontender. No hepatosplenomegaly. Vascular: No leg edema.    Lab Results:  Lab Results  Component Value Date   WBC 7.6 09/08/2016   HGB 17.2 (H) 09/08/2016   HCT 51.3 (H) 09/08/2016   MCV 94.0 09/08/2016   PLT 156 09/08/2016   NEUTROABS 5.7 09/08/2016    Imaging:  No results found.  Medications: I have reviewed the patient's current medications.  Assessment/Plan: 1. Burkitt's lymphoma presenting in May 2007 with rapidly progressive pancytopenia, constitutional symptoms, progressive low back pain and migratory bone pain, low grade fever, hyperuricemia, and marked elevation of serum LDH to over 5000. Bone marrow biopsy was 100% cellular with 93% immature cells with light microscopy and clinical features all compatible with Burkitt's lymphoma, but cytogenetic studies did not show the classic 8;14 translocation. He was treated on an aggressive protocol modeled after the cancer and leukemia group B with induction Cytoxan, vincristine, and prednisone, and then alternating cycles of ifosfamide, methotrexate, vincristine,  Ara-C, and etoposide with Cytoxan, Adriamycin, and vincristine. Intrathecal chemotherapy with methotrexate and hydrocortisone and cranial radiation. He achieved a complete remission.  2. Polycythemia. Stable. JAK 2 mutation analysis 02/23/2011 negative for the JAK 2 V617F mutation. Erythropoietin level 02/23/2011 mildly elevated at 37.2. Felt to likely be secondary to testosterone replacement. Stable. 3. History of mild elevation of bilirubin.  4. Essential hypertension. 5. History of elevated PSA. He has been evaluated by urology. 6. ADHD , taking Adderall. 7. Glaucoma 8. Left leg DVT November 2017. On Xarelto.   Disposition: Mr. Venezia appears stable. He is now greater than 10 years out from the initial diagnosis of Burkitt's lymphoma. On exam today he appears to have 2 small bilateral cervical lymph nodes. We will have him return at a four-month interval to reevaluate. He will contact the office in the interim with any problems.  Patient seen with Dr. Benay Spice. 25 minutes were spent face-to-face at today's visit with the majority of that time involved in counseling/coordination of care.  Ned Card ANP/GNP-BC   09/08/2016  9:44 AM  This was a shared visit with Ned Card. Mr. Pruiett was interviewed and examined. I suspect the small cervical nodes are a benign finding. He will return for an office visit in 4 months. He is now on Xarelto anticoagulation after developing a DVT in November 2017.  Julieanne Manson, [name]

## 2016-09-08 NOTE — Telephone Encounter (Signed)
Scheduled appt per 8/7 los - Gave patient AVS and calender per los.  

## 2016-11-13 DIAGNOSIS — E7849 Other hyperlipidemia: Secondary | ICD-10-CM | POA: Diagnosis not present

## 2016-11-13 DIAGNOSIS — I10 Essential (primary) hypertension: Secondary | ICD-10-CM | POA: Diagnosis not present

## 2016-11-13 DIAGNOSIS — Z86718 Personal history of other venous thrombosis and embolism: Secondary | ICD-10-CM | POA: Diagnosis not present

## 2016-11-13 DIAGNOSIS — Z0000 Encounter for general adult medical examination without abnormal findings: Secondary | ICD-10-CM | POA: Diagnosis not present

## 2016-11-13 DIAGNOSIS — R748 Abnormal levels of other serum enzymes: Secondary | ICD-10-CM | POA: Diagnosis not present

## 2016-11-13 DIAGNOSIS — Z125 Encounter for screening for malignant neoplasm of prostate: Secondary | ICD-10-CM | POA: Diagnosis not present

## 2016-11-13 DIAGNOSIS — Z23 Encounter for immunization: Secondary | ICD-10-CM | POA: Diagnosis not present

## 2017-01-08 ENCOUNTER — Telehealth: Payer: Self-pay | Admitting: Oncology

## 2017-01-08 ENCOUNTER — Ambulatory Visit: Payer: 59 | Admitting: Oncology

## 2017-01-08 VITALS — BP 154/100 | HR 88 | Temp 97.8°F | Resp 19 | Ht 73.0 in | Wt 235.6 lb

## 2017-01-08 DIAGNOSIS — I10 Essential (primary) hypertension: Secondary | ICD-10-CM | POA: Diagnosis not present

## 2017-01-08 DIAGNOSIS — Z86718 Personal history of other venous thrombosis and embolism: Secondary | ICD-10-CM

## 2017-01-08 DIAGNOSIS — Z7901 Long term (current) use of anticoagulants: Secondary | ICD-10-CM | POA: Diagnosis not present

## 2017-01-08 DIAGNOSIS — H409 Unspecified glaucoma: Secondary | ICD-10-CM

## 2017-01-08 DIAGNOSIS — D751 Secondary polycythemia: Secondary | ICD-10-CM | POA: Diagnosis not present

## 2017-01-08 DIAGNOSIS — Z8572 Personal history of non-Hodgkin lymphomas: Secondary | ICD-10-CM | POA: Diagnosis not present

## 2017-01-08 NOTE — Progress Notes (Signed)
[facility] OFFICE PROGRESS NOTE   Diagnosis: Burkitt's lymphoma  INTERVAL HISTORY:   Mr. Aschoff returns for a scheduled visit.  He feels well.  No complaint.  Objective:  Vital signs in last 24 hours:  Blood pressure (!) 154/100, pulse 88, temperature 97.8 F (36.6 C), temperature source Oral, resp. rate 19, height 6\' 1"  (1.854 m), weight 235 lb 9.6 oz (106.9 kg), SpO2 98 %.    HEENT: Neck without mass Lymphatics: 1/2 cm soft mobile low right posterior cervical node, pea-sized left scalene node.  No supraclavicular, right axillary, or inguinal nodes.  1 cm mobile left axillary node versus a prominent fat pad. Resp: Lungs clear bilaterally Cardio: The rate and rhythm GI: No hepatosplenomegaly, no mass, nontender Vascular: No leg edema, the left lower leg is larger than the right side   Lab Results:  Lab Results  Component Value Date   WBC 7.6 09/08/2016   HGB 17.2 (H) 09/08/2016   HCT 51.3 (H) 09/08/2016   MCV 94.0 09/08/2016   PLT 156 09/08/2016   NEUTROABS 5.7 09/08/2016    CMP     Component Value Date/Time   NA 138 09/08/2016 0909   K 4.1 09/08/2016 0909   CL 107 11/23/2014 0913   CL 103 02/19/2012 0806   CO2 26 09/08/2016 0909   GLUCOSE 111 09/08/2016 0909   GLUCOSE 84 02/19/2012 0806   BUN 16.2 09/08/2016 0909   CREATININE 1.1 09/08/2016 0909   CALCIUM 9.9 09/08/2016 0909   PROT 7.2 09/08/2016 0909   ALBUMIN 4.1 09/08/2016 0909   AST 29 09/08/2016 0909   ALT 34 09/08/2016 0909   ALKPHOS 43 09/08/2016 0909   BILITOT 1.53 (H) 09/08/2016 0909   GFRNONAA >60 11/23/2014 0913   GFRAA >60 11/23/2014 0913     Medications: I have reviewed the patient's current medications.  Assessment/Plan: 1. Burkitt's lymphoma presenting in May 2007 with rapidly progressive pancytopenia, constitutional symptoms, progressive low back pain and migratory bone pain, low grade fever, hyperuricemia, and marked elevation of serum LDH to over 5000. Bone marrow  biopsy was 100% cellular with 93% immature cells with light microscopy and clinical features all compatible with Burkitt's lymphoma, but cytogenetic studies did not show the classic 8;14 translocation. He was treated on an aggressive protocol modeled after the cancer and leukemia group B with induction Cytoxan, vincristine, and prednisone, and then alternating cycles of ifosfamide, methotrexate, vincristine, Ara-C, and etoposide with Cytoxan, Adriamycin, and vincristine. Intrathecal chemotherapy with methotrexate and hydrocortisone and cranial radiation. He achieved a complete remission.  2. Polycythemia. Stable. JAK 2 mutation analysis 02/23/2011 negative for the JAK 2 V617F mutation. Erythropoietin level 02/23/2011 mildly elevated at 37.2. Felt to likely be secondary to testosterone replacement. Stable. 3. History of mild elevation of bilirubin.  4. Essential hypertension. 5. History of elevated PSA. He has been evaluated by urology. 6. ADHD , taking Adderall. 7. Glaucoma 8. Left leg DVT November 2017. On Xarelto.   Disposition:  Mr. Plouff is in clinical remission from Burkitt's lymphoma.  The small neck nodes have not enlarged over the past 4 months and are likely benign.  He will return for an office visit and CBC in August 2018. The mild erythrocytosis may be related to testosterone therapy.  He will discussed decreasing the testosterone dose with Dr.Holwerda.  15 minutes were spent with the patient today.  The majority of the time was used for counseling and coordination of care.  Betsy Coder, MD  01/08/2017  3:08 PM

## 2017-01-08 NOTE — Telephone Encounter (Signed)
Scheduled appt per 12/7 los - Gave patient AVS and calender per los.  

## 2017-02-11 ENCOUNTER — Other Ambulatory Visit: Payer: Self-pay | Admitting: Urology

## 2017-03-15 ENCOUNTER — Encounter (HOSPITAL_BASED_OUTPATIENT_CLINIC_OR_DEPARTMENT_OTHER): Payer: Self-pay

## 2017-03-25 ENCOUNTER — Ambulatory Visit (HOSPITAL_BASED_OUTPATIENT_CLINIC_OR_DEPARTMENT_OTHER): Admit: 2017-03-25 | Payer: 59 | Admitting: Urology

## 2017-03-25 ENCOUNTER — Encounter (HOSPITAL_BASED_OUTPATIENT_CLINIC_OR_DEPARTMENT_OTHER): Payer: Self-pay

## 2017-03-25 HISTORY — DX: Aneurysm of unspecified site: I72.9

## 2017-03-25 HISTORY — DX: Anxiety disorder, unspecified: F41.9

## 2017-03-25 HISTORY — DX: Headache: R51

## 2017-03-25 HISTORY — DX: Major depressive disorder, single episode, unspecified: F32.9

## 2017-03-25 HISTORY — DX: Depression, unspecified: F32.A

## 2017-03-25 HISTORY — DX: Acute embolism and thrombosis of unspecified deep veins of unspecified lower extremity: I82.409

## 2017-03-25 HISTORY — DX: Personal history of other diseases of the nervous system and sense organs: Z86.69

## 2017-03-25 HISTORY — DX: Attention-deficit hyperactivity disorder, unspecified type: F90.9

## 2017-03-25 HISTORY — DX: Elevated prostate specific antigen (PSA): R97.20

## 2017-03-25 HISTORY — DX: Unspecified glaucoma: H40.9

## 2017-03-25 HISTORY — DX: Endocrine disorder, unspecified: E34.9

## 2017-03-25 HISTORY — DX: Cerebral infarction, unspecified: I63.9

## 2017-03-25 HISTORY — DX: White matter disease, unspecified: R90.82

## 2017-03-25 HISTORY — DX: Headache, unspecified: R51.9

## 2017-03-25 SURGERY — INSERTION, PENILE PROSTHESIS, INFLATABLE
Anesthesia: General

## 2017-04-16 DIAGNOSIS — H401112 Primary open-angle glaucoma, right eye, moderate stage: Secondary | ICD-10-CM | POA: Diagnosis not present

## 2017-06-04 DIAGNOSIS — H401132 Primary open-angle glaucoma, bilateral, moderate stage: Secondary | ICD-10-CM | POA: Diagnosis not present

## 2017-07-06 ENCOUNTER — Other Ambulatory Visit: Payer: Self-pay

## 2017-07-06 ENCOUNTER — Emergency Department (HOSPITAL_COMMUNITY)
Admission: EM | Admit: 2017-07-06 | Discharge: 2017-07-06 | Disposition: A | Discharge status: Home / Self Care | Payer: 59 | Attending: Emergency Medicine | Admitting: Emergency Medicine

## 2017-07-06 ENCOUNTER — Encounter (HOSPITAL_COMMUNITY): Payer: Self-pay | Admitting: Emergency Medicine

## 2017-07-06 DIAGNOSIS — I8393 Asymptomatic varicose veins of bilateral lower extremities: Secondary | ICD-10-CM | POA: Diagnosis not present

## 2017-07-06 DIAGNOSIS — Z7901 Long term (current) use of anticoagulants: Secondary | ICD-10-CM | POA: Diagnosis not present

## 2017-07-06 DIAGNOSIS — Z79899 Other long term (current) drug therapy: Secondary | ICD-10-CM | POA: Diagnosis not present

## 2017-07-06 DIAGNOSIS — I83892 Varicose veins of left lower extremities with other complications: Secondary | ICD-10-CM | POA: Diagnosis not present

## 2017-07-06 DIAGNOSIS — Z8572 Personal history of non-Hodgkin lymphomas: Secondary | ICD-10-CM | POA: Diagnosis not present

## 2017-07-06 DIAGNOSIS — I83899 Varicose veins of unspecified lower extremities with other complications: Secondary | ICD-10-CM

## 2017-07-06 DIAGNOSIS — I10 Essential (primary) hypertension: Secondary | ICD-10-CM | POA: Diagnosis not present

## 2017-07-06 NOTE — Discharge Instructions (Addendum)
You were evaluated in the emergency department for bleeding from a varicose vein.  The bleeding had stopped by the time he got here and we wrapped it with some special dressing that will help stabilize the clot.  You should remove this dressing carefully tomorrow evening or Thursday.   we are giving you the name and phone number of the vascular surgeon that you may consider consulting about your varicose veins.  You should also let your primary care doctor know about this because they may also be able to give you some further references.  Please return if any problems.

## 2017-07-06 NOTE — ED Provider Notes (Signed)
[facility] EMERGENCY DEPARTMENT Provider Note   CSN: [id number] Arrival date & time: 07/06/17  1922     History   Chief Complaint Chief Complaint  Patient presents with  . Coagulation Disorder    HPI [name] is a 57 y.o. male.  Prior history of cancer in remission and also on anticoagulation for DVT.  He has had chronic varicose veins in his lower extremities and tonight after getting out of the shower as he was trying of his leg he had spontaneous bleeding from his his left lower leg about his ankle.  He denies any real trauma he said he was just cleaning of the skin.  This is never happened before.  He was unable to stop the bleeding with pressure and so presented to the emergency department for further evaluation.  He does not endorse any fevers chills chest pain dizziness lightheadedness syncope.   Wound Check  This is a new problem. The current episode started 1 to 2 hours ago. The problem has been gradually improving. Pertinent negatives include no chest pain, no abdominal pain, no headaches and no shortness of breath. The symptoms are aggravated by walking. The symptoms are relieved by lying down.    Past Medical History:  Diagnosis Date  . Abnormal liver function test 08/31/2011   Mild elevation bilirubin, normal/high hemoglobin, normal LDH  . Adult ADHD   . Aneurysm ([place]) 10/23/2014   80mm left anterior communicating artery region, noted on MRI head  . Anxiety   . Burkitt's lymphoma ([place])    followed by Dr. Julieanne Manson  . Cerebellar infarct ([place]) 10/23/2014   Chronic, noted on MRI head  . Chest pain   . Depression   . DVT (deep venous thrombosis) ([place]) 12/2015   Left leg  . Elevated PSA   . Glaucoma   . Headache   . History of double vision 2016  . HTN (hypertension), benign 08/31/2011  . Hyperlipidemia   . Hypotestosteronemia   . Polycythemia, secondary 08/31/2011   Likely due to testosterone injections  . White matter disease 10/23/2014   noted on MRI  head, "extensive"    Patient Active Problem List   Diagnosis Date Noted  . Hyperlipidemia 08/21/2014  . Chest pain 08/21/2014  . Bronchitis 05/08/2014  . Candida rash of groin 05/08/2014  . HTN (hypertension), benign 08/31/2011  . Polycythemia, secondary 08/31/2011  . Abnormal liver function test 08/31/2011  . Burkitt's lymphoma ([place]) 02/23/2011    Past Surgical History:  Procedure Laterality Date  . CEREBRAL ANGIOGRAM  11/23/2014  . HERNIA REPAIR    . KNEE SURGERY Left   . SHOULDER SURGERY          Home Medications    Prior to Admission medications   Medication Sig Start Date End Date Taking? Authorizing Provider  buPROPion (WELLBUTRIN XL) 300 MG 24 hr tablet Take 300 mg by mouth daily.  08/10/13   [provider]  dextroamphetamine (DEXTROSTAT) 5 MG tablet Take 5 mg by mouth 3 (three) times daily.    [provider]  latanoprost (XALATAN) 0.005 % ophthalmic solution  08/20/15   [provider]  lisinopril (PRINIVIL,ZESTRIL) 40 MG tablet Take 40 mg by mouth daily.  08/07/14   [provider]  omeprazole (PRILOSEC) 20 MG capsule Take 20 mg by mouth daily.    [provider]  sildenafil (VIAGRA) 100 MG tablet Take 100 mg by mouth daily as needed for erectile dysfunction.     [provider]  testosterone cypionate (DEPOTESTOTERONE CYPIONATE) 200 MG/ML injection Inject 150 mg into the muscle every 14 (fourteen) days. Last injection 08/05/14    [provider]  XARELTO 20 MG TABS tablet 20 mg daily. 08/22/16   [provider]    Family History Family History  Problem Relation Age of Onset  . Stroke Father        Deceased  . Hypertension Mother        Living    Social History Social History   Tobacco Use  . Smoking status: Never Smoker  . Smokeless tobacco: Never Used  Substance Use Topics  . Alcohol use: Yes    Alcohol/week: 0.0 oz    Comment: once a week   . Drug use: No     Allergies     Vicodin [hydrocodone-acetaminophen]   Review of Systems Review of Systems  Constitutional: Negative for fever.  HENT: Negative for sore throat.   Eyes: Negative for visual disturbance.  Respiratory: Negative for shortness of breath.   Cardiovascular: Negative for chest pain.  Gastrointestinal: Negative for abdominal pain.  Genitourinary: Negative for dysuria.  Skin: Positive for wound. Negative for rash.  Neurological: Negative for headaches.     Physical Exam Updated Vital Signs BP (!) 122/94 (BP Location: Right Arm)   Pulse 76   Temp 98.4 F (36.9 C) (Oral)   Resp 18   Ht 6\' 1"  (1.854 m)   Wt 102.1 kg (225 lb)   SpO2 99%   BMI 29.69 kg/m   Physical Exam  Constitutional: He appears well-developed and well-nourished.  HENT:  Head: Normocephalic and atraumatic.  Eyes: Conjunctivae are normal.  Neck: Neck supple.  Pulmonary/Chest: Effort normal.  Musculoskeletal: Normal range of motion. He exhibits no tenderness or deformity.  He is got superficial varicosities over his lower extremities bilaterally..  On the medial side of his left ankle he has a dressing in place that is saturated with blood.  When the dressing was removed there was no active bleeding.  He showed me the area that he thought the wound was that currently is hemostatic.  Neurological: He is alert. GCS eye subscore is 4. GCS verbal subscore is 5. GCS motor subscore is 6.  Skin: Skin is warm and dry.  Psychiatric: He has a normal mood and affect.  Nursing note and vitals reviewed.    ED Treatments / Results  Labs (all labs ordered are listed, but only abnormal results are displayed) Labs Reviewed - No data to display  EKG None  Radiology No results found.  Procedures Procedures (including critical care time)  Medications Ordered in ED Medications - No data to display   Initial Impression / Assessment and Plan / ED Course  I have reviewed the triage vital signs and the nursing  notes.  Pertinent labs & imaging results that were available during my care of the patient were reviewed by me and considered in my medical decision making (see chart for details).  Clinical Course as of Jul 09 1126  Tue Jul 06, 2017  2015 After gently cleaning the area, applied quick clot and a Coban pressure dressing. Will observe for 30 min and then have ambulate. If no rebleeding, discharge to followup with his pcp and vascular regarding discussion about treatment of varicosities that he is interested in.    [MB]    Clinical Course User Index [MB] Hayden Rasmussen, MD     Final Clinical Impressions(s) / ED Diagnoses   Final diagnoses:  Bleeding from varicose vein    ED Discharge Orders    None       Hayden Rasmussen, MD 07/08/17 1129

## 2017-07-06 NOTE — ED Triage Notes (Addendum)
Pt states his left lower leg has been bleeding from varicose vein x 2 hours. Pt takes xarelto daily

## 2017-07-30 ENCOUNTER — Ambulatory Visit: Payer: Self-pay | Admitting: Podiatry

## 2017-08-02 ENCOUNTER — Other Ambulatory Visit: Payer: Self-pay | Admitting: Physician Assistant

## 2017-08-02 DIAGNOSIS — I671 Cerebral aneurysm, nonruptured: Secondary | ICD-10-CM

## 2017-08-25 ENCOUNTER — Other Ambulatory Visit: Payer: Self-pay

## 2017-08-25 DIAGNOSIS — I83893 Varicose veins of bilateral lower extremities with other complications: Secondary | ICD-10-CM

## 2017-09-10 ENCOUNTER — Encounter: Payer: Self-pay | Admitting: Nurse Practitioner

## 2017-09-10 ENCOUNTER — Inpatient Hospital Stay: Payer: 59 | Attending: Nurse Practitioner | Admitting: Nurse Practitioner

## 2017-09-10 ENCOUNTER — Inpatient Hospital Stay: Payer: 59

## 2017-09-10 VITALS — BP 112/77 | HR 69 | Temp 98.0°F | Resp 18 | Ht 73.0 in | Wt 229.1 lb

## 2017-09-10 DIAGNOSIS — D751 Secondary polycythemia: Secondary | ICD-10-CM

## 2017-09-10 DIAGNOSIS — C8379 Burkitt lymphoma, extranodal and solid organ sites: Secondary | ICD-10-CM

## 2017-09-10 DIAGNOSIS — R590 Localized enlarged lymph nodes: Secondary | ICD-10-CM

## 2017-09-10 DIAGNOSIS — C8370 Burkitt lymphoma, unspecified site: Secondary | ICD-10-CM

## 2017-09-10 DIAGNOSIS — Z9221 Personal history of antineoplastic chemotherapy: Secondary | ICD-10-CM | POA: Diagnosis not present

## 2017-09-10 LAB — CBC WITH DIFFERENTIAL/PLATELET
BASOS PCT: 1 %
Basophils Absolute: 0.1 10*3/uL (ref 0.0–0.1)
Eosinophils Absolute: 0.1 10*3/uL (ref 0.0–0.5)
Eosinophils Relative: 2 %
HEMATOCRIT: 47.4 % (ref 38.4–49.9)
Hemoglobin: 16 g/dL (ref 13.0–17.1)
Lymphocytes Relative: 21 %
Lymphs Abs: 1.3 10*3/uL (ref 0.9–3.3)
MCH: 31.8 pg (ref 27.2–33.4)
MCHC: 33.8 g/dL (ref 32.0–36.0)
MCV: 94.2 fL (ref 79.3–98.0)
MONO ABS: 0.4 10*3/uL (ref 0.1–0.9)
MONOS PCT: 6 %
NEUTROS ABS: 4.6 10*3/uL (ref 1.5–6.5)
Neutrophils Relative %: 70 %
Platelets: 154 10*3/uL (ref 140–400)
RBC: 5.03 MIL/uL (ref 4.20–5.82)
RDW: 14.7 % — AB (ref 11.0–14.6)
WBC: 6.4 10*3/uL (ref 4.0–10.3)

## 2017-09-10 NOTE — Progress Notes (Signed)
  [facility] OFFICE PROGRESS NOTE   Diagnosis: Burkitt's lymphoma  INTERVAL HISTORY:   [name] returns as scheduled.  No interim illnesses or infections.  He had recent bleeding from a left lower leg varicosity.  He has been referred to a vein specialist.  He reports a good appetite.  No fevers or sweats.  No pain.  No shortness of breath.  Objective:  Vital signs in last 24 hours:  Blood pressure 112/77, pulse 69, temperature 98 F (36.7 C), temperature source Oral, resp. rate 18, height '6\' 1"'$  (1.854 m), weight 229 lb 1.6 oz (103.9 kg), SpO2 99 %.    HEENT: Neck without mass. Lymphatics: Bilateral approximate 1/2 cm or smaller low cervical, mid neck region, lymph nodes; 1 cm mobile left axillary lymph node versus prominent fat pad. Resp: Lungs clear bilaterally. Cardio: Regular rate and rhythm. GI: Abdomen soft and nontender.  No hepatospleno megaly. Vascular: No leg edema.  Bilateral varicosities.    Lab Results:  Lab Results  Component Value Date   WBC 6.4 09/10/2017   HGB 16.0 09/10/2017   HCT 47.4 09/10/2017   MCV 94.2 09/10/2017   PLT 154 09/10/2017   NEUTROABS 4.6 09/10/2017    Imaging:  No results found.  Medications: I have reviewed the patient's current medications.  Assessment/Plan: 1. Burkitt's lymphoma presenting in May 2007 with rapidly progressive pancytopenia, constitutional symptoms, progressive low back pain and migratory bone pain, low grade fever, hyperuricemia, and marked elevation of serum LDH to over 5000. Bone marrow biopsy was 100% cellular with 93% immature cells with light microscopy and clinical features all compatible with Burkitt's lymphoma, but cytogenetic studies did not show the classic 8;14 translocation. He was treated on an aggressive protocol modeled after the cancer and leukemia group B with induction Cytoxan, vincristine, and prednisone, and then alternating cycles of ifosfamide, methotrexate, vincristine, Ara-C, and  etoposide with Cytoxan, Adriamycin, and vincristine. Intrathecal chemotherapy with methotrexate and hydrocortisone and cranial radiation. He achieved a complete remission.  2. Polycythemia. Stable. JAK 2 mutation analysis 02/23/2011 negative for the JAK 2 V617F mutation. Erythropoietin level 02/23/2011 mildly elevated at 37.2. Felt to likely be secondary to testosterone replacement. Stable. 3. History of mild elevation of bilirubin.  4. Essential hypertension. 5. History of elevated PSA. He has been evaluated by urology. 6. ADHD , taking Adderall. 7. Glaucoma 8. Left leg DVT November 2017. On Xarelto.   Disposition: [name] remains in clinical remission from Burkitt's lymphoma.  The small neck nodes are unchanged, likely benign.  He understands to contact the office with any change.  Otherwise we will plan to see him back in 1 year with a CBC.  He will contact the office in the interim as outlined above or with any other problems.  Plan reviewed with Dr. Benay Spice.    Ned Card ANP/GNP-BC   09/10/2017  10:11 AM

## 2017-09-10 NOTE — Addendum Note (Signed)
Addended by: Hughie Closs on: 09/10/2017 11:50 AM   Modules accepted: Orders

## 2017-09-13 ENCOUNTER — Telehealth: Payer: Self-pay | Admitting: Nurse Practitioner

## 2017-09-13 NOTE — Telephone Encounter (Signed)
Scheduled appt per 8/9 los - sent reminder letter in the mail with appt date and time. - lab and f/u in one year.

## 2017-09-16 ENCOUNTER — Encounter: Payer: Self-pay | Admitting: Vascular Surgery

## 2017-09-16 ENCOUNTER — Ambulatory Visit: Payer: 59 | Admitting: Vascular Surgery

## 2017-09-16 ENCOUNTER — Ambulatory Visit (HOSPITAL_COMMUNITY)
Admission: RE | Admit: 2017-09-16 | Discharge: 2017-09-16 | Disposition: A | Discharge status: Home / Self Care | Payer: 59 | Source: Ambulatory Visit | Attending: Vascular Surgery | Admitting: Vascular Surgery

## 2017-09-16 ENCOUNTER — Other Ambulatory Visit: Payer: Self-pay

## 2017-09-16 VITALS — BP 117/76 | HR 90 | Temp 97.8°F | Resp 18 | Ht 73.0 in | Wt 233.0 lb

## 2017-09-16 DIAGNOSIS — I83811 Varicose veins of right lower extremities with pain: Secondary | ICD-10-CM

## 2017-09-16 DIAGNOSIS — I83893 Varicose veins of bilateral lower extremities with other complications: Secondary | ICD-10-CM | POA: Insufficient documentation

## 2017-09-16 DIAGNOSIS — Z86718 Personal history of other venous thrombosis and embolism: Secondary | ICD-10-CM | POA: Insufficient documentation

## 2017-09-16 DIAGNOSIS — I83892 Varicose veins of left lower extremities with other complications: Secondary | ICD-10-CM | POA: Diagnosis not present

## 2017-09-16 NOTE — Progress Notes (Signed)
Referring Physician: Dr Velna Hatchet Patient name: [name] MRN: [id number] DOB: 1961/01/23 Sex: male  REASON FOR CONSULT: Varicose veins with bleeding  HPI: [name] is a 57 y.o. male, who had a bleeding episode from a varicose vein on the medial aspect of his left ankle about 3 months ago.  This was his only bleeding episode so far.  He has had varicose veins for several years.  He develops burning itching and sometimes some swelling in both lower extremities although the left leg is the only one that has had a bleeding episode.  He denies family history of varicose veins.  He does have a history of DVT he thinks this was in the right calf.  This was in 2007 and 18 months ago.  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  He did have active cancer at that time.  The recent bleeding episode required in a visit to the emergency room.  He has worn some knee-high compression stockings in the past.  Other medical problems include attention hyperlipidemia both of which are currently stable.  Past Medical History:  Diagnosis Date  . Abnormal liver function test 08/31/2011   Mild elevation bilirubin, normal/high hemoglobin, normal LDH  . Adult ADHD   . Aneurysm ([place]) 10/23/2014   56mm left anterior communicating artery region, noted on MRI head  . Anxiety   . Burkitt's lymphoma ([place])    followed by Dr. Julieanne Manson  . Cerebellar infarct ([place]) 10/23/2014   Chronic, noted on MRI head  . Chest pain   . Depression   . DVT (deep venous thrombosis) ([place]) 12/2015   Left leg  . Elevated PSA   . Glaucoma   . Headache   . History of double vision 2016  . HTN (hypertension), benign 08/31/2011  . Hyperlipidemia   . Hypotestosteronemia   . Polycythemia, secondary 08/31/2011   Likely due to testosterone injections  . White matter disease 10/23/2014   noted on MRI head, "extensive"   Past Surgical History:  Procedure Laterality Date  . CEREBRAL ANGIOGRAM   11/23/2014  . HERNIA REPAIR    . KNEE SURGERY Left   . SHOULDER SURGERY      Family History  Problem Relation Age of Onset  . Stroke Father        Deceased  . Hypertension Mother        Living    SOCIAL HISTORY: Social History   Socioeconomic History  . Marital status: Married    Spouse name: Not on file  . Number of children: Not on file  . Years of education: Not on file  . Highest education level: Not on file  Occupational History  . Not on file  Social Needs  . Financial resource strain: Not on file  . Food insecurity:    Worry: Not on file    Inability: Not on file  . Transportation needs:    Medical: Not on file    Non-medical: Not on file  Tobacco Use  . Smoking status: Never Smoker  . Smokeless tobacco: Never Used  Substance and Sexual Activity  . Alcohol use: Yes    Alcohol/week: 0.0 standard drinks    Comment: once a week   . Drug use: No  . Sexual activity: Not on file  Lifestyle  . Physical activity:    Days per week: Not on file    Minutes per session: Not on file  . Stress: Not  on file  Relationships  . Social connections:    Talks on phone: Not on file    Gets together: Not on file    Attends religious service: Not on file    Active member of club or organization: Not on file    Attends meetings of clubs or organizations: Not on file    Relationship status: Not on file  . Intimate partner violence:    Fear of current or ex partner: Not on file    Emotionally abused: Not on file    Physically abused: Not on file    Forced sexual activity: Not on file  Other Topics Concern  . 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    Married, no children.     Allergies  Allergen Reactions  . Vicodin [Hydrocodone-Acetaminophen] Nausea And Vomiting    Current Outpatient Medications  Medication Sig Dispense Refill  . chlorthalidone (HYGROTON) 25 MG tablet Take 25 mg by mouth daily.    [name]  dextroamphetamine (DEXTROSTAT) 5 MG tablet Take 5 mg by mouth 3 (three) times daily.    [name] latanoprost (XALATAN) 0.005 % ophthalmic solution     . lisinopril (PRINIVIL,ZESTRIL) 40 MG tablet Take 40 mg by mouth daily.     [name] omeprazole (PRILOSEC) 20 MG capsule Take 20 mg by mouth daily.    . sildenafil (VIAGRA) 100 MG tablet Take 100 mg by mouth daily as needed for erectile dysfunction.     [name] testosterone cypionate (DEPOTESTOTERONE CYPIONATE) 200 MG/ML injection Inject 150 mg into the muscle every 14 (fourteen) days. Last injection 08/05/14    . XARELTO 20 MG TABS tablet 20 mg daily.  5   No current facility-administered medications for this visit.     ROS:   General:  No weight loss, Fever, chills  HEENT: No recent headaches, no nasal bleeding, no visual changes, no sore throat  Neurologic: No dizziness, blackouts, seizures. No recent symptoms of stroke or mini- stroke. No recent episodes of slurred speech, or temporary blindness.  Cardiac: No recent episodes of chest pain/pressure, no shortness of breath at rest.  No shortness of breath with exertion.  Denies history of atrial fibrillation or irregular heartbeat  Vascular: No history of rest pain in feet.  No history of claudication.  No history of non-healing ulcer, + history of DVT   Pulmonary: No home oxygen, no productive cough, no hemoptysis,  No asthma or wheezing  Musculoskeletal:  [ ]  Arthritis, [ ]  Low back pain,  [ ]  Joint pain  Hematologic:No history of hypercoagulable state.  No history of easy bleeding.  No history of anemia  Gastrointestinal: No hematochezia or melena,  No gastroesophageal reflux, no trouble swallowing  Urinary: [ ]  chronic Kidney disease, [ ]  on HD - [ ]  MWF or [ ]  TTHS, [ ]  Burning with urination, [ ]  Frequent urination, [ ]  Difficulty urinating;   Skin: No rashes  Psychological: No history of anxiety,  No history of depression   Physical Examination  Vitals:   09/16/17 1234  BP: 117/76  Pulse:  90  Resp: 18  Temp: 97.8 F (36.6 C)  TempSrc: Oral  SpO2: 98%  Weight: 233 lb (105.7 kg)  Height: 6\' 1"  (1.854 m)    Body mass index is 30.74 kg/m.  General:  Alert and oriented, no acute distress HEENT: Normal Neck: No bruit or JVD Pulmonary: Clear to auscultation bilaterally Cardiac: Regular Rate and Rhythm  without murmur Abdomen: Soft, non-tender, non-distended, no mass, no scars Skin: No rash large cluster varicosities encompassing an area of about 7 x 7 cm with engorged veins 5 to 7 mm diameter.  This is on the right leg.  Left ankles bilaterally have a large number of diffuse reticular type varicosities.  Right posterior calf has 1 bulging varicosity about 5 mm in diameter. Extremity Pulses:  2+ radial, brachial, femoral, dorsalis pedis, posterior tibial pulses bilaterally Musculoskeletal: No deformity or edema  Neurologic: Upper and lower extremity motor 5/5 and symmetric  DATA:  Patient had a venous reflux exam today.  This showed diffuse reflux in the right greater saphenous vein with a diameter of 5 to 7 mm.  The left greater saphenous vein was dilated 5 to 7 mm but no reflux was noted.  There were also findings in the left femoral vein of most likely chronic DVT.  The vein was patent with no significant obstruction.  There was deep vein reflux in the common femoral vein on the right and the common femoral and popliteal vein on the left.  I reviewed and interpreted the study.  ASSESSMENT: Bleeding episode from left ankle varicosity.  Although his ultrasound did not show significant reflux in the left greater saphenous vein the vein is dilated and probably contributing to these varicosities and putting him at risk for further bleeding.  He certainly has symptomatic varicose veins with itching and one bleeding episode so far.   PLAN: Patient was given a prescription today for long leg lower extremity compression stockings.  We will see if we can get him approved for left and  right greater saphenous vein laser ablation.  Also consider stab avulsions on the right leg.  I believe he is certainly at risk for additional bleeding episodes without laser ablation.  He may also need to do some sclerotherapy of the left ankle in order to decrease bleeding risk.  Patient was instructed today to elevate his extremity if he has another bleeding episode to assist with hemostasis.  He will follow-up with me in 3 months time.  He will call us sooner if he has an additional bleeding episode.   Ruta Hinds, MD [facility] Office: [phone] Pager: [phone]

## 2017-11-12 DIAGNOSIS — Z125 Encounter for screening for malignant neoplasm of prostate: Secondary | ICD-10-CM | POA: Diagnosis not present

## 2017-11-12 DIAGNOSIS — R82998 Other abnormal findings in urine: Secondary | ICD-10-CM | POA: Diagnosis not present

## 2017-11-12 DIAGNOSIS — E291 Testicular hypofunction: Secondary | ICD-10-CM | POA: Diagnosis not present

## 2017-11-12 DIAGNOSIS — Z0000 Encounter for general adult medical examination without abnormal findings: Secondary | ICD-10-CM | POA: Diagnosis not present

## 2017-11-19 DIAGNOSIS — I129 Hypertensive chronic kidney disease with stage 1 through stage 4 chronic kidney disease, or unspecified chronic kidney disease: Secondary | ICD-10-CM | POA: Diagnosis not present

## 2017-11-19 DIAGNOSIS — Z23 Encounter for immunization: Secondary | ICD-10-CM | POA: Diagnosis not present

## 2017-11-19 DIAGNOSIS — N183 Chronic kidney disease, stage 3 (moderate): Secondary | ICD-10-CM | POA: Diagnosis not present

## 2017-11-19 DIAGNOSIS — Z1389 Encounter for screening for other disorder: Secondary | ICD-10-CM | POA: Diagnosis not present

## 2017-11-19 DIAGNOSIS — E1165 Type 2 diabetes mellitus with hyperglycemia: Secondary | ICD-10-CM | POA: Diagnosis not present

## 2017-11-19 DIAGNOSIS — Z0000 Encounter for general adult medical examination without abnormal findings: Secondary | ICD-10-CM | POA: Diagnosis not present

## 2017-11-22 DIAGNOSIS — Z1212 Encounter for screening for malignant neoplasm of rectum: Secondary | ICD-10-CM | POA: Diagnosis not present

## 2017-12-15 ENCOUNTER — Encounter: Payer: Self-pay | Admitting: Vascular Surgery

## 2017-12-15 ENCOUNTER — Other Ambulatory Visit: Payer: Self-pay

## 2017-12-15 ENCOUNTER — Ambulatory Visit (INDEPENDENT_AMBULATORY_CARE_PROVIDER_SITE_OTHER): Payer: 59 | Admitting: Vascular Surgery

## 2017-12-15 VITALS — BP 137/92 | HR 83 | Resp 16 | Ht 73.0 in | Wt 234.0 lb

## 2017-12-15 DIAGNOSIS — I83813 Varicose veins of bilateral lower extremities with pain: Secondary | ICD-10-CM

## 2017-12-15 NOTE — Progress Notes (Signed)
Patient is a 57 year old male who returns for follow-up today.  He had a bleeding episode from a varicosity in his left ankle area in May 2019.  He has not had a bleeding episode since that time.  He has varicose veins prominently on the right leg.  He has diffuse reticular type varicosities of both ankles.  He bled from 1 of these reticular's in the left medial malleolus previously.  He has been compliant wearing his compression stockings which are thigh-high.  He states they do improve the achiness and fullness in his legs.  Review of systems: He denies shortness of breath.  He denies chest pain.  He does have a prior history of DVT and is on Xarelto.  Family History  Problem Relation Age of Onset  . Stroke Father        Deceased  . Hypertension Mother        Living    Social History   Socioeconomic History  . Marital status: Married    Spouse name: Not on file  . Number of children: Not on file  . Years of education: Not on file  . Highest education level: Not on file  Occupational History  . Not on file  Social Needs  . Financial resource strain: Not on file  . Food insecurity:    Worry: Not on file    Inability: Not on file  . Transportation needs:    Medical: Not on file    Non-medical: Not on file  Tobacco Use  . Smoking status: Never Smoker  . Smokeless tobacco: Never Used  Substance and Sexual Activity  . Alcohol use: Yes    Alcohol/week: 0.0 standard drinks    Comment: once a week   . Drug use: No  . Sexual activity: Not on file  Lifestyle  . Physical activity:    Days per week: Not on file    Minutes per session: Not on file  . Stress: Not on file  Relationships  . Social connections:    Talks on phone: Not on file    Gets together: Not on file    Attends religious service: Not on file    Active member of club or organization: Not on file    Attends meetings of clubs or organizations: Not on file    Relationship status: Not on file  . Intimate partner  violence:    Fear of current or ex partner: Not on file    Emotionally abused: Not on file    Physically abused: Not on file    Forced sexual activity: Not on file  Other Topics Concern  . 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    Married, no children.     Current Outpatient Medications on File Prior to Visit  Medication Sig Dispense Refill  . chlorthalidone (HYGROTON) 25 MG tablet Take 25 mg by mouth daily.    [name] dextroamphetamine (DEXTROSTAT) 5 MG tablet Take 5 mg by mouth 3 (three) times daily.    [name] latanoprost (XALATAN) 0.005 % ophthalmic solution     . lisinopril (PRINIVIL,ZESTRIL) 40 MG tablet Take 40 mg by mouth daily.     [name] omeprazole (PRILOSEC) 20 MG capsule Take 20 mg by mouth daily.    . sildenafil (VIAGRA) 100 MG tablet Take 100 mg by mouth daily as needed for erectile dysfunction.     [name] testosterone cypionate (DEPOTESTOTERONE CYPIONATE) 200 MG/ML injection Inject  150 mg into the muscle every 14 (fourteen) days. Last injection 08/05/14    . XARELTO 20 MG TABS tablet 20 mg daily.  5   No current facility-administered medications on file prior to visit.       Physical exam:  Vitals:   12/15/17 1538  BP: (!) 137/92  Pulse: 83  Resp: 16  Weight: 234 lb (106.1 kg)  Height: 6\' 1"  (1.854 m)    Extremities: 2+ dorsalis pedis pulses bilaterally  Skin: Diffuse reticular type varicosities medial malleolus bilaterally.  Large bulging varicosities right medial calf and medial thigh 5 to 7 mm diameter.  Data: I used the Sono[employer] ultrasound to examine the patient's left greater saphenous vein today at the bedside.  It is 6 to 7 mm in diameter diffusely throughout its course.  Assessment: Symptomatic varicose veins with prior episode of bleeding left leg.  Saphenous vein diameter in the left leg is 5 to 7 mm.  Deep venous reflux was noted bilaterally on prior ultrasound.  Plan: We will see if we can get the patient preapproved  for laser ablation of the left greater saphenous vein.  Although this did not show reflux I am suspicious that he certainly has reflux from the diameter of the vein and the physical exam findings.  I think he will be at high risk for rebleeding.  If he is not pre approved then I would suggest we repeat his duplex exam  Ruta Hinds, MD [facility] Office: [phone] Pager: [phone]

## 2017-12-21 ENCOUNTER — Other Ambulatory Visit: Payer: Self-pay | Admitting: *Deleted

## 2017-12-21 ENCOUNTER — Telehealth: Payer: Self-pay | Admitting: *Deleted

## 2017-12-21 DIAGNOSIS — I83813 Varicose veins of bilateral lower extremities with pain: Secondary | ICD-10-CM

## 2017-12-21 NOTE — Telephone Encounter (Signed)
Left detailed telephone message informing [name] that [facility] has denied approval for endovenous laser ablation of left greater saphenous vein because his vein size was too small and there was no reflux at the saphenofemoral junction (left leg).  Communicated via staff message to Dr. Oneida Alar and he wants to see [name] in 3 months (03-2018) for repeat venous reflux study and VV FU visit with him.  Put in request for venous reflux and VV FU. Administrative staff ([facility]) to call [name] with these appointments.

## 2018-01-04 DIAGNOSIS — H401134 Primary open-angle glaucoma, bilateral, indeterminate stage: Secondary | ICD-10-CM | POA: Diagnosis not present

## 2018-03-18 DIAGNOSIS — N183 Chronic kidney disease, stage 3 (moderate): Secondary | ICD-10-CM | POA: Diagnosis not present

## 2018-03-18 DIAGNOSIS — M5489 Other dorsalgia: Secondary | ICD-10-CM | POA: Diagnosis not present

## 2018-03-18 DIAGNOSIS — E1165 Type 2 diabetes mellitus with hyperglycemia: Secondary | ICD-10-CM | POA: Diagnosis not present

## 2018-03-18 DIAGNOSIS — R5383 Other fatigue: Secondary | ICD-10-CM | POA: Diagnosis not present

## 2018-03-21 ENCOUNTER — Telehealth: Payer: Self-pay | Admitting: *Deleted

## 2018-03-21 DIAGNOSIS — C8370 Burkitt lymphoma, unspecified site: Secondary | ICD-10-CM

## 2018-03-21 DIAGNOSIS — D751 Secondary polycythemia: Secondary | ICD-10-CM

## 2018-03-21 NOTE — Telephone Encounter (Signed)
Left VM with DJ with Dr. Hoover Brunette office to inquire about repeat labs for today that Dr. Benay Spice had requested on Friday. This message was left for [place], NP. Called patient to inquire if he had labs drawn today. He was not aware of needing repeat labs. He agrees to come to [facility] tomorrow for these labs.

## 2018-03-22 ENCOUNTER — Telehealth: Payer: Self-pay | Admitting: *Deleted

## 2018-03-22 ENCOUNTER — Inpatient Hospital Stay: Payer: 59 | Attending: Oncology

## 2018-03-22 DIAGNOSIS — Z9221 Personal history of antineoplastic chemotherapy: Secondary | ICD-10-CM | POA: Insufficient documentation

## 2018-03-22 DIAGNOSIS — C8370 Burkitt lymphoma, unspecified site: Secondary | ICD-10-CM

## 2018-03-22 DIAGNOSIS — C8379 Burkitt lymphoma, extranodal and solid organ sites: Secondary | ICD-10-CM | POA: Diagnosis present

## 2018-03-22 DIAGNOSIS — D751 Secondary polycythemia: Secondary | ICD-10-CM

## 2018-03-22 DIAGNOSIS — R590 Localized enlarged lymph nodes: Secondary | ICD-10-CM | POA: Insufficient documentation

## 2018-03-22 LAB — CBC WITH DIFFERENTIAL (CANCER CENTER ONLY)
ABS IMMATURE GRANULOCYTES: 0.06 10*3/uL (ref 0.00–0.07)
BASOS ABS: 0 10*3/uL (ref 0.0–0.1)
Basophils Relative: 0 %
EOS ABS: 0.1 10*3/uL (ref 0.0–0.5)
Eosinophils Relative: 2 %
HCT: 51.6 % (ref 39.0–52.0)
HEMOGLOBIN: 16.7 g/dL (ref 13.0–17.0)
IMMATURE GRANULOCYTES: 1 %
LYMPHS PCT: 24 %
Lymphs Abs: 1.7 10*3/uL (ref 0.7–4.0)
MCH: 29.7 pg (ref 26.0–34.0)
MCHC: 32.4 g/dL (ref 30.0–36.0)
MCV: 91.8 fL (ref 80.0–100.0)
MONOS PCT: 6 %
Monocytes Absolute: 0.4 10*3/uL (ref 0.1–1.0)
NEUTROS PCT: 67 %
NRBC: 0 % (ref 0.0–0.2)
Neutro Abs: 4.9 10*3/uL (ref 1.7–7.7)
Platelet Count: 165 10*3/uL (ref 150–400)
RBC: 5.62 MIL/uL (ref 4.22–5.81)
RDW: 14.1 % (ref 11.5–15.5)
WBC Count: 7.2 10*3/uL (ref 4.0–10.5)

## 2018-03-22 LAB — CMP (CANCER CENTER ONLY)
ALBUMIN: 3.8 g/dL (ref 3.5–5.0)
ALT: 31 U/L (ref 0–44)
ANION GAP: 8 (ref 5–15)
AST: 34 U/L (ref 15–41)
Alkaline Phosphatase: 42 U/L (ref 38–126)
BUN: 25 mg/dL — ABNORMAL HIGH (ref 6–20)
CHLORIDE: 102 mmol/L (ref 98–111)
CO2: 27 mmol/L (ref 22–32)
Calcium: 9.5 mg/dL (ref 8.9–10.3)
Creatinine: 1.49 mg/dL — ABNORMAL HIGH (ref 0.61–1.24)
GFR, EST AFRICAN AMERICAN: 60 mL/min — AB (ref >60)
GFR, Estimated: 51 mL/min — ABNORMAL LOW (ref >60)
GLUCOSE: 153 mg/dL — AB (ref 70–99)
Potassium: 3.7 mmol/L (ref 3.5–5.1)
SODIUM: 137 mmol/L (ref 135–145)
Total Bilirubin: 0.9 mg/dL (ref 0.3–1.2)
Total Protein: 7.5 g/dL (ref 6.5–8.1)

## 2018-03-22 LAB — LACTATE DEHYDROGENASE: LDH: 183 U/L (ref 98–192)

## 2018-03-22 NOTE — Telephone Encounter (Signed)
Notified patient of normal LDH and WBC normal now. Reviewed all labs with him. Informed him that he needs to push po fluids and follow up with PCP regarding the kidney function elevation. Faxed copy of labs to PCP office-Dr. Ardeth Perfect.

## 2018-03-24 ENCOUNTER — Encounter (HOSPITAL_COMMUNITY): Payer: Self-pay

## 2018-03-24 ENCOUNTER — Ambulatory Visit: Payer: Self-pay | Admitting: Vascular Surgery

## 2018-05-25 DIAGNOSIS — E1165 Type 2 diabetes mellitus with hyperglycemia: Secondary | ICD-10-CM | POA: Diagnosis not present

## 2018-05-25 DIAGNOSIS — D751 Secondary polycythemia: Secondary | ICD-10-CM | POA: Diagnosis not present

## 2018-05-25 DIAGNOSIS — N184 Chronic kidney disease, stage 4 (severe): Secondary | ICD-10-CM | POA: Diagnosis not present

## 2018-09-12 ENCOUNTER — Inpatient Hospital Stay: Payer: 59 | Admitting: Oncology

## 2018-09-12 ENCOUNTER — Inpatient Hospital Stay: Payer: 59

## 2018-10-31 ENCOUNTER — Ambulatory Visit: Payer: 59 | Admitting: Oncology

## 2018-10-31 ENCOUNTER — Other Ambulatory Visit: Payer: 59

## 2018-10-31 ENCOUNTER — Other Ambulatory Visit: Payer: Self-pay | Admitting: *Deleted

## 2018-10-31 DIAGNOSIS — C8370 Burkitt lymphoma, unspecified site: Secondary | ICD-10-CM

## 2018-11-01 ENCOUNTER — Inpatient Hospital Stay: Payer: 59 | Attending: Oncology

## 2018-11-01 ENCOUNTER — Other Ambulatory Visit: Payer: Self-pay

## 2018-11-01 ENCOUNTER — Inpatient Hospital Stay (HOSPITAL_BASED_OUTPATIENT_CLINIC_OR_DEPARTMENT_OTHER): Payer: 59 | Admitting: Oncology

## 2018-11-01 VITALS — BP 142/92 | HR 71 | Temp 98.0°F | Resp 17 | Ht 73.0 in | Wt 233.3 lb

## 2018-11-01 DIAGNOSIS — I10 Essential (primary) hypertension: Secondary | ICD-10-CM | POA: Insufficient documentation

## 2018-11-01 DIAGNOSIS — Z86718 Personal history of other venous thrombosis and embolism: Secondary | ICD-10-CM | POA: Diagnosis not present

## 2018-11-01 DIAGNOSIS — Z9221 Personal history of antineoplastic chemotherapy: Secondary | ICD-10-CM | POA: Insufficient documentation

## 2018-11-01 DIAGNOSIS — Z7901 Long term (current) use of anticoagulants: Secondary | ICD-10-CM | POA: Diagnosis not present

## 2018-11-01 DIAGNOSIS — Z79899 Other long term (current) drug therapy: Secondary | ICD-10-CM | POA: Diagnosis not present

## 2018-11-01 DIAGNOSIS — C8370 Burkitt lymphoma, unspecified site: Secondary | ICD-10-CM | POA: Diagnosis not present

## 2018-11-01 DIAGNOSIS — R51 Headache: Secondary | ICD-10-CM | POA: Diagnosis not present

## 2018-11-01 DIAGNOSIS — Z923 Personal history of irradiation: Secondary | ICD-10-CM | POA: Insufficient documentation

## 2018-11-01 DIAGNOSIS — F909 Attention-deficit hyperactivity disorder, unspecified type: Secondary | ICD-10-CM | POA: Insufficient documentation

## 2018-11-01 DIAGNOSIS — D751 Secondary polycythemia: Secondary | ICD-10-CM | POA: Diagnosis not present

## 2018-11-01 LAB — CBC WITH DIFFERENTIAL (CANCER CENTER ONLY)
Abs Immature Granulocytes: 0.02 10*3/uL (ref 0.00–0.07)
Basophils Absolute: 0 10*3/uL (ref 0.0–0.1)
Basophils Relative: 0 %
Eosinophils Absolute: 0.1 10*3/uL (ref 0.0–0.5)
Eosinophils Relative: 1 %
HCT: 51.6 % (ref 39.0–52.0)
Hemoglobin: 17.7 g/dL — ABNORMAL HIGH (ref 13.0–17.0)
Immature Granulocytes: 0 %
Lymphocytes Relative: 24 %
Lymphs Abs: 1.8 10*3/uL (ref 0.7–4.0)
MCH: 31.5 pg (ref 26.0–34.0)
MCHC: 34.3 g/dL (ref 30.0–36.0)
MCV: 91.8 fL (ref 80.0–100.0)
Monocytes Absolute: 0.4 10*3/uL (ref 0.1–1.0)
Monocytes Relative: 6 %
Neutro Abs: 5.1 10*3/uL (ref 1.7–7.7)
Neutrophils Relative %: 69 %
Platelet Count: 151 10*3/uL (ref 150–400)
RBC: 5.62 MIL/uL (ref 4.22–5.81)
RDW: 13.2 % (ref 11.5–15.5)
WBC Count: 7.6 10*3/uL (ref 4.0–10.5)
nRBC: 0 % (ref 0.0–0.2)

## 2018-11-01 NOTE — Progress Notes (Signed)
  [name] OFFICE PROGRESS NOTE   Diagnosis: Burkitt's lymphoma  INTERVAL HISTORY:   [name] returns for a scheduled visit.  He feels well.  He reports intermittent headaches.  He is no longer taking a diuretic.  No bleeding.  Objective:  Vital signs in last 24 hours:  Blood pressure (!) 142/92, pulse 71, temperature 98 F (36.7 C), temperature source Temporal, resp. rate 17, height '6\' 1"'$  (1.854 m), weight 233 lb 4.8 oz (105.8 kg), SpO2 100 %.    HEENT: Neck without mass Lymphatics: Pea-sized right posterior cervical node, soft mobile 1-2 cm left axillary nodes versus a prominent fat pad, no inguinal nodes GI: No hepatosplenomegaly Vascular: No leg edema, support stockings in place   Portacath/PICC-without erythema  Lab Results:  Lab Results  Component Value Date   WBC 7.6 11/01/2018   HGB 17.7 (H) 11/01/2018   HCT 51.6 11/01/2018   MCV 91.8 11/01/2018   PLT 151 11/01/2018   NEUTROABS 5.1 11/01/2018   Medications: I have reviewed the patient's current medications.   Assessment/Plan: 1. Burkitt's lymphoma presenting in May 2007 with rapidly progressive pancytopenia, constitutional symptoms, progressive low back pain and migratory bone pain, low grade fever, hyperuricemia, and marked elevation of serum LDH to over 5000. Bone marrow biopsy was 100% cellular with 93% immature cells with light microscopy and clinical features all compatible with Burkitt's lymphoma, but cytogenetic studies did not show the classic 8;14 translocation. He was treated on an aggressive protocol modeled after the cancer and leukemia group B with induction Cytoxan, vincristine, and prednisone, and then alternating cycles of ifosfamide, methotrexate, vincristine, Ara-C, and etoposide with Cytoxan, Adriamycin, and vincristine. Intrathecal chemotherapy with methotrexate and hydrocortisone and cranial radiation. He achieved a complete remission.  2. Polycythemia. [name] JAK 2 mutation analysis  02/23/2011 negative for the JAK 2 [id number] mutation. Erythropoietin  3. History of mild elevation of bilirubin.  4. Essential hypertension. 5. History of elevated PSA. He has been evaluated by urology. 6. ADHD , taking Adderall. 7. Glaucoma 8. Left leg DVT November 2017. On Xarelto.     Disposition: [name] is in remission from Burkitt's lymphoma.  He plans to continue clinical follow-up with Dr. Ardeth Perfect.  He will obtain an influenza vaccine.  [name] will contact us for new symptoms. I asked him to discuss the indication for continuing testosterone therapy with Dr. Ardeth Perfect.  The testosterone may be contributing to the erythrocytosis.  [name] is not scheduled for a follow-up appointment at the Cancer center.  We are available to see him in the future as needed.  Betsy Coder, MD  11/01/2018  3:49 PM

## 2019-09-04 ENCOUNTER — Other Ambulatory Visit: Payer: Self-pay

## 2019-09-04 ENCOUNTER — Observation Stay (HOSPITAL_COMMUNITY): Payer: 59

## 2019-09-04 ENCOUNTER — Inpatient Hospital Stay (HOSPITAL_COMMUNITY)
Admission: EM | Admit: 2019-09-04 | Discharge: 2019-09-06 | DRG: 065 | Disposition: A | Discharge status: Home / Self Care | Payer: 59 | Attending: Family Medicine | Admitting: Family Medicine

## 2019-09-04 ENCOUNTER — Emergency Department (HOSPITAL_COMMUNITY): Payer: 59

## 2019-09-04 ENCOUNTER — Encounter (HOSPITAL_COMMUNITY): Payer: Self-pay | Admitting: *Deleted

## 2019-09-04 DIAGNOSIS — N390 Urinary tract infection, site not specified: Secondary | ICD-10-CM | POA: Diagnosis present

## 2019-09-04 DIAGNOSIS — R739 Hyperglycemia, unspecified: Secondary | ICD-10-CM

## 2019-09-04 DIAGNOSIS — E1165 Type 2 diabetes mellitus with hyperglycemia: Secondary | ICD-10-CM | POA: Diagnosis present

## 2019-09-04 DIAGNOSIS — E119 Type 2 diabetes mellitus without complications: Secondary | ICD-10-CM

## 2019-09-04 DIAGNOSIS — K219 Gastro-esophageal reflux disease without esophagitis: Secondary | ICD-10-CM | POA: Diagnosis present

## 2019-09-04 DIAGNOSIS — I10 Essential (primary) hypertension: Secondary | ICD-10-CM | POA: Diagnosis not present

## 2019-09-04 DIAGNOSIS — Z885 Allergy status to narcotic agent status: Secondary | ICD-10-CM

## 2019-09-04 DIAGNOSIS — Z79899 Other long term (current) drug therapy: Secondary | ICD-10-CM

## 2019-09-04 DIAGNOSIS — H539 Unspecified visual disturbance: Secondary | ICD-10-CM | POA: Diagnosis present

## 2019-09-04 DIAGNOSIS — N3001 Acute cystitis with hematuria: Secondary | ICD-10-CM | POA: Diagnosis not present

## 2019-09-04 DIAGNOSIS — E0800 Diabetes mellitus due to underlying condition with hyperosmolarity without nonketotic hyperglycemic-hyperosmolar coma (NKHHC): Secondary | ICD-10-CM

## 2019-09-04 DIAGNOSIS — I6389 Other cerebral infarction: Secondary | ICD-10-CM | POA: Diagnosis not present

## 2019-09-04 DIAGNOSIS — R270 Ataxia, unspecified: Secondary | ICD-10-CM

## 2019-09-04 DIAGNOSIS — I351 Nonrheumatic aortic (valve) insufficiency: Secondary | ICD-10-CM | POA: Diagnosis present

## 2019-09-04 DIAGNOSIS — F909 Attention-deficit hyperactivity disorder, unspecified type: Secondary | ICD-10-CM | POA: Diagnosis present

## 2019-09-04 DIAGNOSIS — Z823 Family history of stroke: Secondary | ICD-10-CM

## 2019-09-04 DIAGNOSIS — R29702 NIHSS score 2: Secondary | ICD-10-CM | POA: Diagnosis present

## 2019-09-04 DIAGNOSIS — E785 Hyperlipidemia, unspecified: Secondary | ICD-10-CM | POA: Diagnosis present

## 2019-09-04 DIAGNOSIS — I639 Cerebral infarction, unspecified: Secondary | ICD-10-CM

## 2019-09-04 DIAGNOSIS — Z8249 Family history of ischemic heart disease and other diseases of the circulatory system: Secondary | ICD-10-CM

## 2019-09-04 DIAGNOSIS — R531 Weakness: Secondary | ICD-10-CM | POA: Diagnosis present

## 2019-09-04 DIAGNOSIS — R260 Ataxic gait: Secondary | ICD-10-CM | POA: Diagnosis present

## 2019-09-04 DIAGNOSIS — Z86718 Personal history of other venous thrombosis and embolism: Secondary | ICD-10-CM

## 2019-09-04 DIAGNOSIS — Z7901 Long term (current) use of anticoagulants: Secondary | ICD-10-CM

## 2019-09-04 DIAGNOSIS — Z8673 Personal history of transient ischemic attack (TIA), and cerebral infarction without residual deficits: Secondary | ICD-10-CM

## 2019-09-04 DIAGNOSIS — D751 Secondary polycythemia: Secondary | ICD-10-CM | POA: Diagnosis not present

## 2019-09-04 DIAGNOSIS — D696 Thrombocytopenia, unspecified: Secondary | ICD-10-CM | POA: Diagnosis present

## 2019-09-04 DIAGNOSIS — Z20822 Contact with and (suspected) exposure to covid-19: Secondary | ICD-10-CM | POA: Diagnosis present

## 2019-09-04 DIAGNOSIS — Z8572 Personal history of non-Hodgkin lymphomas: Secondary | ICD-10-CM

## 2019-09-04 LAB — CBC WITH DIFFERENTIAL/PLATELET
Abs Immature Granulocytes: 0.02 10*3/uL (ref 0.00–0.07)
Basophils Absolute: 0 10*3/uL (ref 0.0–0.1)
Basophils Relative: 0 %
Eosinophils Absolute: 0.1 10*3/uL (ref 0.0–0.5)
Eosinophils Relative: 1 %
HCT: 54.3 % — ABNORMAL HIGH (ref 39.0–52.0)
Hemoglobin: 17.8 g/dL — ABNORMAL HIGH (ref 13.0–17.0)
Immature Granulocytes: 0 %
Lymphocytes Relative: 18 %
Lymphs Abs: 1.2 10*3/uL (ref 0.7–4.0)
MCH: 31.3 pg (ref 26.0–34.0)
MCHC: 32.8 g/dL (ref 30.0–36.0)
MCV: 95.4 fL (ref 80.0–100.0)
Monocytes Absolute: 0.4 10*3/uL (ref 0.1–1.0)
Monocytes Relative: 6 %
Neutro Abs: 5.2 10*3/uL (ref 1.7–7.7)
Neutrophils Relative %: 75 %
Platelets: 144 10*3/uL — ABNORMAL LOW (ref 150–400)
RBC: 5.69 MIL/uL (ref 4.22–5.81)
RDW: 14.3 % (ref 11.5–15.5)
WBC: 6.9 10*3/uL (ref 4.0–10.5)
nRBC: 0 % (ref 0.0–0.2)

## 2019-09-04 LAB — URINALYSIS, ROUTINE W REFLEX MICROSCOPIC
Bilirubin Urine: NEGATIVE
Glucose, UA: >=500 mg/dL — AB
Ketones, ur: NEGATIVE mg/dL
Nitrite: POSITIVE — AB
Protein, ur: 30 mg/dL — AB
Specific Gravity, Urine: 1.02 (ref 1.005–1.030)
WBC, UA: >50 WBC/hpf — ABNORMAL HIGH (ref 0–5)
pH: 5 (ref 5.0–8.0)

## 2019-09-04 LAB — RAPID URINE DRUG SCREEN, HOSP PERFORMED
Amphetamines: NOT DETECTED
Barbiturates: NOT DETECTED
Benzodiazepines: NOT DETECTED
Cocaine: NOT DETECTED
Opiates: NOT DETECTED
Tetrahydrocannabinol: NOT DETECTED

## 2019-09-04 LAB — BASIC METABOLIC PANEL
Anion gap: 9 (ref 5–15)
BUN: 14 mg/dL (ref 6–20)
CO2: 26 mmol/L (ref 22–32)
Calcium: 9.4 mg/dL (ref 8.9–10.3)
Chloride: 102 mmol/L (ref 98–111)
Creatinine, Ser: 1.06 mg/dL (ref 0.61–1.24)
GFR calc Af Amer: >60 mL/min (ref >60)
GFR calc non Af Amer: >60 mL/min (ref >60)
Glucose, Bld: 223 mg/dL — ABNORMAL HIGH (ref 70–99)
Potassium: 4 mmol/L (ref 3.5–5.1)
Sodium: 137 mmol/L (ref 135–145)

## 2019-09-04 LAB — TSH: TSH: 0.879 u[IU]/mL (ref 0.350–4.500)

## 2019-09-04 LAB — ETHANOL: Alcohol, Ethyl (B): <10 mg/dL (ref <10)

## 2019-09-04 LAB — FOLATE: Folate: 17.9 ng/mL (ref >5.9)

## 2019-09-04 LAB — SARS CORONAVIRUS 2 BY RT PCR (HOSPITAL ORDER, PERFORMED IN ~~LOC~~ HOSPITAL LAB): SARS Coronavirus 2: NEGATIVE

## 2019-09-04 LAB — PROTIME-INR
INR: 2.2 — ABNORMAL HIGH (ref 0.8–1.2)
Prothrombin Time: 24 seconds — ABNORMAL HIGH (ref 11.4–15.2)

## 2019-09-04 LAB — APTT: aPTT: 36 seconds (ref 24–36)

## 2019-09-04 LAB — VITAMIN B12: Vitamin B-12: 401 pg/mL (ref 180–914)

## 2019-09-04 LAB — CBG MONITORING, ED: Glucose-Capillary: 249 mg/dL — ABNORMAL HIGH (ref 70–99)

## 2019-09-04 MED ORDER — RIVAROXABAN 20 MG PO TABS
20.0000 mg | ORAL_TABLET | Freq: Every day | ORAL | Status: DC
  Administered 2019-09-05 – 2019-09-06 (×2): 20 mg via ORAL
  Filled 2019-09-04 (×3): qty 1

## 2019-09-04 MED ORDER — ACETAMINOPHEN 325 MG PO TABS
650.0000 mg | ORAL_TABLET | ORAL | Status: DC | PRN
  Administered 2019-09-05 (×2): 650 mg via ORAL
  Filled 2019-09-04 (×2): qty 2

## 2019-09-04 MED ORDER — VALACYCLOVIR HCL 500 MG PO TABS
1000.0000 mg | ORAL_TABLET | Freq: Every day | ORAL | Status: DC
  Administered 2019-09-05 – 2019-09-06 (×2): 1000 mg via ORAL
  Filled 2019-09-04 (×3): qty 2

## 2019-09-04 MED ORDER — LATANOPROST 0.005 % OP SOLN
1.0000 [drp] | Freq: Every day | OPHTHALMIC | Status: DC
  Administered 2019-09-04 – 2019-09-05 (×2): 1 [drp] via OPHTHALMIC
  Filled 2019-09-04 (×2): qty 2.5

## 2019-09-04 MED ORDER — DORZOLAMIDE HCL-TIMOLOL MAL 2-0.5 % OP SOLN
1.0000 [drp] | Freq: Two times a day (BID) | OPHTHALMIC | Status: DC
  Administered 2019-09-04 – 2019-09-06 (×4): 1 [drp] via OPHTHALMIC
  Filled 2019-09-04 (×2): qty 10

## 2019-09-04 MED ORDER — CEPHALEXIN 500 MG PO CAPS
500.0000 mg | ORAL_CAPSULE | Freq: Once | ORAL | Status: AC
  Administered 2019-09-04: 500 mg via ORAL
  Filled 2019-09-04: qty 1

## 2019-09-04 MED ORDER — PANTOPRAZOLE SODIUM 40 MG PO TBEC
40.0000 mg | DELAYED_RELEASE_TABLET | Freq: Every day | ORAL | Status: DC
  Administered 2019-09-05 – 2019-09-06 (×2): 40 mg via ORAL
  Filled 2019-09-04 (×2): qty 1

## 2019-09-04 MED ORDER — SODIUM CHLORIDE 0.9 % IV SOLN: INTRAVENOUS | Status: DC

## 2019-09-04 MED ORDER — ACETAMINOPHEN 160 MG/5ML PO SOLN: 650.0000 mg | ORAL | Status: DC | PRN

## 2019-09-04 MED ORDER — SENNOSIDES-DOCUSATE SODIUM 8.6-50 MG PO TABS: 1.0000 | ORAL_TABLET | Freq: Every evening | ORAL | Status: DC | PRN

## 2019-09-04 MED ORDER — ACETAMINOPHEN 650 MG RE SUPP: 650.0000 mg | RECTAL | Status: DC | PRN

## 2019-09-04 MED ORDER — STROKE: EARLY STAGES OF RECOVERY BOOK
Freq: Once | Status: AC
  Filled 2019-09-04: qty 1

## 2019-09-04 NOTE — ED Provider Notes (Signed)
[facility] EMERGENCY DEPARTMENT Provider Note   CSN: [id number] Arrival date & time: 09/04/19  1005     History Chief Complaint  Patient presents with   Weakness    [name] is a 59 y.o. adult with a history including old cerebellar infarct, history of DVT, known cerebral aneurysm which was 4 mm left anterior communicating artery in 2016, history of Burkitt's lymphoma in remission, also hypertension and hyperlipidemia presenting for evaluation of a 48-hour history of weakness and loss of coordination in his legs.  He describes going to bed Friday evening symptom-free, waking Saturday morning with an inability to control his legs well, stating he appears intoxicated when attempting to walk and has to hold onto objects for better control.  He denies dizziness, also denies headache, no upper extremity weakness or loss of coordination.  He does have a history of chronic low back pain which is not worsened, denies urinary or fecal incontinence or retention.  He also denies any recent illnesses such as fever, cough, nausea or vomiting.  He does notice seeing a "black dot" when he looks upward into the left and his left field of vision, otherwise no other visual defects.  Feels he has less control of his left leg than his right.  HPI     Past Medical History:  Diagnosis Date   Abnormal liver function test 08/31/2011   Mild elevation bilirubin, normal/high hemoglobin, normal LDH   Adult ADHD    Aneurysm ([place]) 10/23/2014   52mm left anterior communicating artery region, noted on MRI head   Anxiety    Burkitt's lymphoma ([place])    followed by Dr. Julieanne Manson   Cerebellar infarct [facility]) 10/23/2014   Chronic, noted on MRI head   Chest pain    Depression    DVT (deep venous thrombosis) ([place]) 12/2015   Left leg   Elevated PSA    Glaucoma    Headache    History of double vision 2016   HTN (hypertension), benign 08/31/2011   Hyperlipidemia    Hypotestosteronemia     Polycythemia, secondary 08/31/2011   Likely due to testosterone injections   White matter disease 10/23/2014   noted on MRI head, "extensive"    Patient Active Problem List   Diagnosis Date Noted   Ataxia 09/04/2019   Acute cystitis with hematuria    Hyperlipidemia 08/21/2014   Chest pain 08/21/2014   Bronchitis 05/08/2014   [place] rash of groin 05/08/2014   HTN (hypertension), benign 08/31/2011   Polycythemia, secondary 08/31/2011   Abnormal liver function test 08/31/2011   Burkitt's lymphoma ([place]) 02/23/2011    Past Surgical History:  Procedure Laterality Date   CEREBRAL ANGIOGRAM  11/23/2014   HERNIA REPAIR     KNEE SURGERY Left    SHOULDER SURGERY         Family History  Problem Relation Age of Onset   Stroke Father        Deceased   Hypertension Mother        Living    Social History   Tobacco Use   Smoking status: Never Smoker   Smokeless tobacco: Never Used  Substance Use Topics   Alcohol use: Yes    Alcohol/week: 0.0 standard drinks    Comment: once a week    Drug use: No    Home Medications Prior to Admission medications   Medication Sig Start Date End Date Taking? Authorizing Provider  dextroamphetamine (DEXTROSTAT) 5 MG tablet Take 5 mg by mouth 3 (  three) times daily.   Yes [provider]  dorzolamide-timolol (COSOPT) 22.3-6.8 MG/ML ophthalmic solution Place 1 drop into the right eye 2 (two) times daily. 08/13/19  Yes [provider]  latanoprost (XALATAN) 0.005 % ophthalmic solution Apply 1 drop to eye at bedtime.   Yes [provider]  lisinopril (PRINIVIL,ZESTRIL) 40 MG tablet Take 40 mg by mouth daily.  08/07/14  Yes [provider]  omeprazole (PRILOSEC) 20 MG capsule Take 20 mg by mouth daily.   Yes [provider]  Testosterone 20.25 MG/ACT (1.62%) GEL APPLY ONE PUMP EACH UPPER ARM ONCE DAILY E29.1 10/23/18  Yes [provider]  valACYclovir (VALTREX) 1000 MG tablet  Take 1,000 mg by mouth daily.   Yes [provider]  XARELTO 20 MG TABS tablet Take 20 mg by mouth daily.  08/22/16  Yes [provider]  sildenafil (VIAGRA) 100 MG tablet Take 100 mg by mouth daily as needed for erectile dysfunction.     [provider]    Allergies    Codeine, Hydrocodone-acetaminophen, and Vicodin [hydrocodone-acetaminophen]  Review of Systems   Review of Systems  Constitutional: Negative for chills and fever.  HENT: Negative for congestion and sore throat.   Eyes: Positive for visual disturbance.  Respiratory: Negative for chest tightness and shortness of breath.   Cardiovascular: Negative for chest pain.  Gastrointestinal: Negative for abdominal pain, nausea and vomiting.  Genitourinary: Negative.   Musculoskeletal: Positive for gait problem. Negative for arthralgias, joint swelling and neck pain.  Skin: Negative.  Negative for rash and wound.  Neurological: Positive for weakness. Negative for dizziness, tremors, speech difficulty, light-headedness, numbness and headaches.       Negative except as mentioned in HPI.   Psychiatric/Behavioral: Negative.     Physical Exam Updated Vital Signs BP 140/89    Pulse 75    Temp 98.6 F (37 C) (Oral)    Resp 19    Ht 6\' 1"  (1.854 m)    Wt (!) 102.1 kg    SpO2 96%    BMI 29.69 kg/m   Physical Exam Vitals and nursing note reviewed.  Constitutional:      Appearance: Normal appearance. She is well-developed.  HENT:     Head: Normocephalic and atraumatic.     Mouth/Throat:     Mouth: Mucous membranes are moist.  Eyes:     General: Lids are normal. Vision grossly intact. No visual field deficit.    Extraocular Movements:     Right eye: Normal extraocular motion and no nystagmus.     Left eye: Normal extraocular motion and no nystagmus.     Conjunctiva/sclera: Conjunctivae normal.  Cardiovascular:     Rate and Rhythm: Normal rate and regular rhythm.     Heart sounds: Normal heart sounds.    Pulmonary:     Effort: Pulmonary effort is normal.     Breath sounds: Normal breath sounds. No wheezing.  Abdominal:     General: Bowel sounds are normal.     Palpations: Abdomen is soft.     Tenderness: There is no abdominal tenderness.  Musculoskeletal:        General: Normal range of motion.     Cervical back: Normal range of motion.  Skin:    General: Skin is warm and dry.  Neurological:     Mental Status: She is alert.     Cranial Nerves: Cranial nerves are intact.     Sensory: Sensation is intact.     Coordination:  Heel to [facility] Test abnormal. Finger-Nose-Finger Test normal. Rapid alternating movements normal.     Gait: Gait abnormal.     Comments: Ataxia, left greater than right with attempts at heel shin test.  Finger nose normal.      ED Results / Procedures / Treatments   Labs (all labs ordered are listed, but only abnormal results are displayed) Labs Reviewed  CBC WITH DIFFERENTIAL/PLATELET - Abnormal; Notable for the following components:      Result Value   Hemoglobin 17.8 (*)    HCT 54.3 (*)    Platelets 144 (*)    All other components within normal limits  BASIC METABOLIC PANEL - Abnormal; Notable for the following components:   Glucose, Bld 223 (*)    All other components within normal limits  URINALYSIS, ROUTINE W REFLEX MICROSCOPIC - Abnormal; Notable for the following components:   APPearance HAZY (*)    Glucose, UA >=500 (*)    Hgb urine dipstick SMALL (*)    Protein, ur 30 (*)    Nitrite POSITIVE (*)    Leukocytes,Ua SMALL (*)    WBC, UA >50 (*)    Bacteria, UA MANY (*)    All other components within normal limits  PROTIME-INR - Abnormal; Notable for the following components:   Prothrombin Time 24.0 (*)    INR 2.2 (*)    All other components within normal limits  CBG MONITORING, ED - Abnormal; Notable for the following components:   Glucose-Capillary 249 (*)    All other components within normal limits  SARS CORONAVIRUS 2 BY RT PCR (HOSPITAL  ORDER, [place] [facility])  URINE CULTURE  URINE CULTURE  ETHANOL  APTT  RAPID URINE DRUG SCREEN, HOSP PERFORMED  HIV ANTIBODY (ROUTINE TESTING W REFLEX)  HEMOGLOBIN A1C  LIPID PANEL  VITAMIN B12  FOLATE  TSH  T4, FREE    EKG None  Radiology CT Head Wo Contrast  Result Date: 09/04/2019 CLINICAL DATA:  Left-sided weakness. EXAM: CT HEAD WITHOUT CONTRAST TECHNIQUE: Contiguous axial images were obtained from the base of the skull through the vertex without intravenous contrast. COMPARISON:  None. FINDINGS: Brain: Mild chronic ischemic white matter disease is noted. No mass effect or midline shift is noted. Ventricular size is within normal limits. There is no evidence of mass lesion, hemorrhage or acute infarction. Vascular: No hyperdense vessel or unexpected calcification. Skull: Normal. Negative for fracture or focal lesion. Sinuses/Orbits: No acute finding. Other: None. IMPRESSION: Mild chronic ischemic white matter disease. No acute intracranial abnormality seen. Electronically Signed   By: Marijo Conception [name]   On: 09/04/2019 12:29    Procedures Procedures (including critical care time)  Medications Ordered in ED Medications  valACYclovir (VALTREX) tablet 1,000 mg (has no administration in time range)  pantoprazole (PROTONIX) EC tablet 40 mg (has no administration in time range)  rivaroxaban (XARELTO) tablet 20 mg (has no administration in time range)  dorzolamide-timolol (COSOPT) 22.3-6.8 MG/ML ophthalmic solution 1 drop (has no administration in time range)  latanoprost (XALATAN) 0.005 % ophthalmic solution 1 drop (has no administration in time range)   stroke: mapping our early stages of recovery book (has no administration in time range)  0.9 %  sodium chloride infusion (has no administration in time range)  acetaminophen (TYLENOL) tablet 650 mg (has no administration in time range)    Or  acetaminophen (TYLENOL) 160 MG/5ML solution 650 mg (has no  administration in time range)    Or  acetaminophen (TYLENOL)  suppository 650 mg (has no administration in time range)  senna-docusate (Senokot-S) tablet 1 tablet (has no administration in time range)  cephALEXin (KEFLEX) capsule 500 mg (500 mg Oral Given 09/04/19 1329)    ED Course  I have reviewed the triage vital signs and the nursing notes.  Pertinent labs & imaging results that were available during my care of the patient were reviewed by me and considered in my medical decision making (see chart for details).  Clinical Course as of Sep 03 1804  Mon Sep 04, 6451  [id number] 59 year old male last known well Friday evening complaining of unsteadiness walking and feeling generally weak. No fevers no headache. Getting basic labs and imaging. Neurology consult. Disposition per results of testing.   [MB]    Clinical Course User Index [MB] Hayden Rasmussen, MD   MDM Rules/Calculators/A&P                          Labs and imaging reviewed and discussed with pt. UTI - asymptomatic, cx sent, keflex started.  Tele psych neuro consult Dr. Knox Royalty- agrees with probable central source of sx, new cva.  Recommends admission for stroke work up, [employer] and mra given known aneurysm.    Call to hospitalist for admission.  Discussed with Dr. Carles Collet who accepts pt for admission. Final Clinical Impression(s) / ED Diagnoses Final diagnoses:  Ataxia  Acute cystitis with hematuria  Cerebrovascular accident (CVA), unspecified mechanism ([place])  Stroke [facility])  Hyperglycemia    Rx / DC Orders ED Discharge Orders    None       Landis Martins 09/04/19 1806    Hayden Rasmussen, MD 09/04/19 1827

## 2019-09-04 NOTE — Consult Note (Signed)
[facility] [employer] TeleNeurology Consult Services  Stat Consult  Date of Service:   09/04/2019 11:25:14  Impression:     .  I63.9 - Cerebrovascular accident (CVA), unspecified mechanism ([place])  Comments/Sign-Out: 59 year old male h/o DVTs on AC with Xarelto presents with symptoms of gait instability that started about 2 days ago. On exam, he has left lower extremity drift as well as ataxia. I am concerned that he is had a small subacute ischemic stroke. CT head without contrast unrevealing. Recommend admission for stroke work-up.  CT HEAD: Showed No Acute Hemorrhage or Acute Core Infarct  Metrics: [employer] Notification Time: 09/04/2019 11:23:40 Stamp Time: 09/04/2019 11:25:14 Callback Response Time: 09/04/2019 11:30:37  Our recommendations are outlined below.  Recommendations:     [name]  MRI brain without IV contrast     .  MRA head/neck     .  Continue anticoagulation     .  TSH, A1c, lipid profile     .  Transthoracic echocardiogram     .  Continuous telemetry     .  Physical, occupational, and speech therapies     .  q4h neuro checks/NIHSS     .  NPO until bedside swallow     .  Neurology follow-up   Other WorkUp:     .  Infectious/metabolic workup per primary team  Disposition: Neurology Follow Up Recommended  Sign Out:     .  Discussed with Emergency Department Provider  ----------------------------------------------------------------------------------------------------  Chief Complaint: Weakness, gait instability  History of Present Illness: Patient is a 59 year old Male.  59 year old h/o HTN, DM, HLD, DVTs on Xarelto presents with symptoms that started about 2 days ago. Felt like he was "stumbling around" on Saturday. Feels like the left side of vision is "darker" than normal, but no clear visual field cut. Feels like he has "no control" over his left leg. No other neuro symptoms. [place] in ED unremarkable.   Past Medical History:     .  Hypertension     . Diabetes Mellitus     . Hyperlipidemia  Anticoagulant use:  xarelto     Examination: BP(144/105), Pulse(77), Blood Glucose(223) 1A: Level of Consciousness - Alert; keenly responsive + 0 1B: Ask Month and Age - Both Questions Right + 0 1C: Blink Eyes & Squeeze Hands - Performs Both Tasks + 0 2: Test Horizontal Extraocular Movements - Normal + 0 3: Test Visual Fields - No Visual Loss + 0 4: Test Facial Palsy (Use Grimace if Obtunded) - Normal symmetry + 0 5A: Test Left Arm Motor Drift - No Drift for 10 Seconds + 0 5B: Test Right Arm Motor Drift - No Drift for 10 Seconds + 0 6A: Test Left Leg Motor Drift - Drift, but doesn't hit bed + 1 6B: Test Right Leg Motor Drift - No Drift for 5 Seconds + 0 7: Test Limb Ataxia (FNF/Heel-Shin) - Ataxia in 1 Limb + 1 8: Test Sensation - Normal; No sensory loss + 0 9: Test Language/Aphasia - Normal; No aphasia + 0 10: Test Dysarthria - Normal + 0 11: Test Extinction/Inattention - No abnormality + 0  NIHSS Score: 2    Patient is being evaluated for possible acute neurologic impairment and high probability of imminent or life-threatening deterioration. I spent total of 35 minutes providing care to this patient, including time for face to face visit via telemedicine, review of medical records, imaging studies and discussion of findings with providers, the patient and/or family.  Dr Knox Royalty   [facility] [phone]  Case [id number]

## 2019-09-04 NOTE — ED Triage Notes (Signed)
Pt states that feels like he does not have any control over his left leg and feels that his left grip is weaker, also problems with vision as well, symptoms started Saturday am.

## 2019-09-04 NOTE — H&P (Signed)
History and Physical  [name] IRC:[id number] DOB: 15-Oct-1960 DOA: 09/04/2019   PCP: Velna Hatchet, MD   Patient coming from: Home  Chief Complaint: gait instability  HPI:  [name] is a 59 y.o. adult with medical history of hypertension, hyperlipidemia, Burkitt's lymphoma in remission, remote cerebellar infarct, left lower extremity DVT, and incidental cerebral aneurysm presenting with gait instability that began when he woke up on 09/02/2019.  The patient was vacationing in [place] at that time.  He decided to wait until he came back home to seek medical care.  The patient denied any word finding difficulty or dysarthria, but he stated occasionally he would have some visual field cuts on his left upper quadrant.  He also had some transient weakness of his upper extremities.  The patient did have a fall but did not hit his head.  He denied any syncope.  He has not had any fevers, chills, chest pain, shortness of breath, coughing, hemoptysis, nausea, vomiting, diarrhea, abdominal pain.  He has not had any dysuria or hematuria.  He has not had any hematochezia or melena.  He states that his gait instability is about the same as on 09/02/2019.  He does feel that his left leg may be a little bit weaker than the right leg.  The patient denies any new medications.  He denies any headache or visual loss. In the emergency department, the patient was afebrile hemodynamically stable with oxygen saturation 97-100% on room air.  BMP and CBC were unremarkable except for platelets of 144,000.  UA showed >50 WBC.  CT of the brain was negative for acute findings.  Assessment/Plan: Gait instability/ataxia -Neurology Consult -PT/OT evaluation -Speech therapy eval -CT brain--neg -MRI brain-- -MRA brain-- -Carotid Duplex-- -Echo-- -LDL-- -HbA1C-- -Antiplatelet--Xarelto -Given the concern for stroke, his dextroamphetamine will be held as well as his testosterone  Essential  hypertension -Holding lisinopril to allow for permissive hypertension -hydralazine prn SBP>220  GERD -Continue PPI  Thrombocytopenia -Check TSH -Serum B12 -folate  ADHD -Holding dextroamphetamine -Urine drug screen negative  Pyuria -Obtain urine culture -Patient was given a dose of cephalexin in the emergency department   History of left lower extremity DVT -Continue rivaroxaban      Past Medical History:  Diagnosis Date  . Abnormal liver function test 08/31/2011   Mild elevation bilirubin, normal/high hemoglobin, normal LDH  . Adult ADHD   . Aneurysm ([place]) 10/23/2014   5mm left anterior communicating artery region, noted on MRI head  . Anxiety   . Burkitt's lymphoma ([place])    followed by Dr. Julieanne Manson  . Cerebellar infarct ([place]) 10/23/2014   Chronic, noted on MRI head  . Chest pain   . Depression   . DVT (deep venous thrombosis) ([place]) 12/2015   Left leg  . Elevated PSA   . Glaucoma   . Headache   . History of double vision 2016  . HTN (hypertension), benign 08/31/2011  . Hyperlipidemia   . Hypotestosteronemia   . Polycythemia, secondary 08/31/2011   Likely due to testosterone injections  . White matter disease 10/23/2014   noted on MRI head, "extensive"   Past Surgical History:  Procedure Laterality Date  . CEREBRAL ANGIOGRAM  11/23/2014  . HERNIA REPAIR    . KNEE SURGERY Left   . SHOULDER SURGERY     Social History:  reports that she has never smoked. She has never used smokeless tobacco. She reports current alcohol use. She reports that she  does not use drugs.   Family History  Problem Relation Age of Onset  . Stroke Father        Deceased  . Hypertension Mother        Living     Allergies  Allergen Reactions  . Codeine Nausea Only  . Hydrocodone-Acetaminophen Other (See Comments)    Makes patient sick  . Vicodin [Hydrocodone-Acetaminophen] Nausea And Vomiting     Prior to Admission medications   Medication Sig Start Date End  Date Taking? Authorizing Provider  dextroamphetamine (DEXTROSTAT) 5 MG tablet Take 5 mg by mouth 3 (three) times daily.   Yes [provider]  dorzolamide-timolol (COSOPT) 22.3-6.8 MG/ML ophthalmic solution Place 1 drop into the right eye 2 (two) times daily. 08/13/19  Yes [provider]  latanoprost (XALATAN) 0.005 % ophthalmic solution Apply 1 drop to eye at bedtime.   Yes [provider]  lisinopril (PRINIVIL,ZESTRIL) 40 MG tablet Take 40 mg by mouth daily.  08/07/14  Yes [provider]  omeprazole (PRILOSEC) 20 MG capsule Take 20 mg by mouth daily.   Yes [provider]  Testosterone 20.25 MG/ACT (1.62%) GEL APPLY ONE PUMP EACH UPPER ARM ONCE DAILY E29.1 10/23/18  Yes [provider]  valACYclovir (VALTREX) 1000 MG tablet Take 1,000 mg by mouth daily.   Yes [provider]  XARELTO 20 MG TABS tablet Take 20 mg by mouth daily.  08/22/16  Yes [provider]  sildenafil (VIAGRA) 100 MG tablet Take 100 mg by mouth daily as needed for erectile dysfunction.     [provider]    Review of Systems:  Constitutional:  No weight loss, night sweats, Fevers, chills, fatigue.  Head&Eyes: No headache.  No vision loss.  No eye pain or scotoma ENT:  No Difficulty swallowing,Tooth/dental problems,Sore throat,  No ear ache, post nasal drip,  Cardio-vascular:  No chest pain, Orthopnea, PND, swelling in lower extremities,  dizziness, palpitations  GI:  No  abdominal pain, nausea, vomiting, diarrhea, loss of appetite, hematochezia, melena, heartburn, indigestion, Resp:  No shortness of breath with exertion or at rest. No cough. No coughing up of blood .No wheezing.No chest wall deformity  Skin:  no rash or lesions.  GU:  no dysuria, change in color of urine, no urgency or frequency. No flank pain.  Musculoskeletal:  No joint pain or swelling. No decreased range of motion. No back pain.  Psych:  No change in mood or  affect. No depression or anxiety. Neurologic:  no dysesthesia,  No syncope  Physical Exam: Vitals:   09/04/19 1211 09/04/19 1330 09/04/19 1331 09/04/19 1430  BP:   (!) 144/105 (!) 155/97  Pulse: 74 81 77 77  Resp: 14 19 20  (!) 24  Temp:      TempSrc:      SpO2: 96% 100% 97% 99%  Weight:      Height:       General:  A&O x 3, NAD, nontoxic, pleasant/cooperative Head/Eye: No conjunctival hemorrhage, no icterus, [place]/AT, No nystagmus ENT:  No icterus,  No thrush, good dentition, no pharyngeal exudate Neck:  No masses, no lymphadenpathy, no bruits CV:  RRR, no rub, no gallop, no S3 Lung:  CTAB, good air movement, no wheeze, no rhonchi Abdomen: soft/NT, +BS, nondistended, no peritoneal signs Ext: No cyanosis, No rashes, No petechiae, No lymphangitis, No edema Neuro: CNII-XII intact, strength 4/5 in bilateral upper and lower extremities, no dysmetria  Labs on Admission:  Basic Metabolic Panel: Recent Labs  Lab 09/04/19 1052  NA 137  K 4.0  CL 102  CO2 26  GLUCOSE 223*  BUN 14  CREATININE 1.06  CALCIUM 9.4   Liver Function Tests: No results for input(s): AST, ALT, ALKPHOS, BILITOT, PROT, ALBUMIN in the last 168 hours. No results for input(s): LIPASE, AMYLASE in the last 168 hours. No results for input(s): AMMONIA in the last 168 hours. CBC: Recent Labs  Lab 09/04/19 1052  WBC 6.9  NEUTROABS 5.2  HGB 17.8*  HCT 54.3*  MCV 95.4  PLT 144*   Coagulation Profile: Recent Labs  Lab 09/04/19 1052  INR 2.2*   Cardiac Enzymes: No results for input(s): CKTOTAL, CKMB, CKMBINDEX, TROPONINI in the last 168 hours. BNP: Invalid input(s): POCBNP CBG: Recent Labs  Lab 09/04/19 1056  GLUCAP 249*   Urine analysis:    Component Value Date/Time   COLORURINE YELLOW 09/04/2019 1115   APPEARANCEUR HAZY (A) 09/04/2019 1115   LABSPEC 1.020 09/04/2019 1115   PHURINE 5.0 09/04/2019 1115   GLUCOSEU >=500 (A) 09/04/2019 1115   HGBUR SMALL (A) 09/04/2019 1115   BILIRUBINUR  NEGATIVE 09/04/2019 1115   KETONESUR NEGATIVE 09/04/2019 1115   PROTEINUR 30 (A) 09/04/2019 1115   NITRITE POSITIVE (A) 09/04/2019 1115   LEUKOCYTESUR SMALL (A) 09/04/2019 1115   Sepsis Labs: @LABRCNTIP (procalcitonin:4,lacticidven:4) )No results found for this or any previous visit (from the past 240 hour(s)).   Radiological Exams on Admission: CT Head Wo Contrast  Result Date: 09/04/2019 CLINICAL DATA:  Left-sided weakness. EXAM: CT HEAD WITHOUT CONTRAST TECHNIQUE: Contiguous axial images were obtained from the base of the skull through the vertex without intravenous contrast. COMPARISON:  None. FINDINGS: Brain: Mild chronic ischemic white matter disease is noted. No mass effect or midline shift is noted. Ventricular size is within normal limits. There is no evidence of mass lesion, hemorrhage or acute infarction. Vascular: No hyperdense vessel or unexpected calcification. Skull: Normal. Negative for fracture or focal lesion. Sinuses/Orbits: No acute finding. Other: None. IMPRESSION: Mild chronic ischemic white matter disease. No acute intracranial abnormality seen. Electronically Signed   By: Marijo Conception [name]   On: 09/04/2019 12:29        Time spent:60 minutes Code Status:   FULL Family Communication:  No Family at bedside Disposition Plan: expect 1-2 day hospitalization Consults called: neurology DVT Prophylaxis: Xarelto  Orson Eva, DO  [facility] Pager [phone]  If 7PM-7AM, please contact night-coverage [email or web address] Password [email or web address] 09/04/2019, 3:05 PM

## 2019-09-05 ENCOUNTER — Observation Stay (HOSPITAL_COMMUNITY): Payer: 59

## 2019-09-05 DIAGNOSIS — I10 Essential (primary) hypertension: Secondary | ICD-10-CM | POA: Diagnosis present

## 2019-09-05 DIAGNOSIS — I351 Nonrheumatic aortic (valve) insufficiency: Secondary | ICD-10-CM | POA: Diagnosis present

## 2019-09-05 DIAGNOSIS — I6389 Other cerebral infarction: Secondary | ICD-10-CM | POA: Diagnosis present

## 2019-09-05 DIAGNOSIS — D696 Thrombocytopenia, unspecified: Secondary | ICD-10-CM | POA: Diagnosis present

## 2019-09-05 DIAGNOSIS — Z823 Family history of stroke: Secondary | ICD-10-CM | POA: Diagnosis not present

## 2019-09-05 DIAGNOSIS — Z7901 Long term (current) use of anticoagulants: Secondary | ICD-10-CM | POA: Diagnosis not present

## 2019-09-05 DIAGNOSIS — D751 Secondary polycythemia: Secondary | ICD-10-CM | POA: Diagnosis present

## 2019-09-05 DIAGNOSIS — E1165 Type 2 diabetes mellitus with hyperglycemia: Secondary | ICD-10-CM | POA: Diagnosis present

## 2019-09-05 DIAGNOSIS — I639 Cerebral infarction, unspecified: Secondary | ICD-10-CM | POA: Diagnosis not present

## 2019-09-05 DIAGNOSIS — R270 Ataxia, unspecified: Secondary | ICD-10-CM | POA: Diagnosis present

## 2019-09-05 DIAGNOSIS — R29702 NIHSS score 2: Secondary | ICD-10-CM | POA: Diagnosis present

## 2019-09-05 DIAGNOSIS — Z20822 Contact with and (suspected) exposure to covid-19: Secondary | ICD-10-CM | POA: Diagnosis present

## 2019-09-05 DIAGNOSIS — E785 Hyperlipidemia, unspecified: Secondary | ICD-10-CM | POA: Diagnosis present

## 2019-09-05 DIAGNOSIS — Z885 Allergy status to narcotic agent status: Secondary | ICD-10-CM | POA: Diagnosis not present

## 2019-09-05 DIAGNOSIS — K219 Gastro-esophageal reflux disease without esophagitis: Secondary | ICD-10-CM | POA: Diagnosis present

## 2019-09-05 DIAGNOSIS — Z86718 Personal history of other venous thrombosis and embolism: Secondary | ICD-10-CM | POA: Diagnosis not present

## 2019-09-05 DIAGNOSIS — R531 Weakness: Secondary | ICD-10-CM | POA: Diagnosis present

## 2019-09-05 DIAGNOSIS — N3001 Acute cystitis with hematuria: Secondary | ICD-10-CM | POA: Diagnosis present

## 2019-09-05 DIAGNOSIS — N390 Urinary tract infection, site not specified: Secondary | ICD-10-CM | POA: Diagnosis present

## 2019-09-05 DIAGNOSIS — H539 Unspecified visual disturbance: Secondary | ICD-10-CM | POA: Diagnosis present

## 2019-09-05 DIAGNOSIS — F909 Attention-deficit hyperactivity disorder, unspecified type: Secondary | ICD-10-CM | POA: Diagnosis present

## 2019-09-05 DIAGNOSIS — E119 Type 2 diabetes mellitus without complications: Secondary | ICD-10-CM

## 2019-09-05 DIAGNOSIS — E0800 Diabetes mellitus due to underlying condition with hyperosmolarity without nonketotic hyperglycemic-hyperosmolar coma (NKHHC): Secondary | ICD-10-CM | POA: Diagnosis not present

## 2019-09-05 DIAGNOSIS — Z8572 Personal history of non-Hodgkin lymphomas: Secondary | ICD-10-CM | POA: Diagnosis not present

## 2019-09-05 DIAGNOSIS — R260 Ataxic gait: Secondary | ICD-10-CM | POA: Diagnosis present

## 2019-09-05 DIAGNOSIS — Z8673 Personal history of transient ischemic attack (TIA), and cerebral infarction without residual deficits: Secondary | ICD-10-CM | POA: Diagnosis not present

## 2019-09-05 DIAGNOSIS — Z8249 Family history of ischemic heart disease and other diseases of the circulatory system: Secondary | ICD-10-CM | POA: Diagnosis not present

## 2019-09-05 DIAGNOSIS — Z79899 Other long term (current) drug therapy: Secondary | ICD-10-CM | POA: Diagnosis not present

## 2019-09-05 LAB — LIPID PANEL
Cholesterol: 198 mg/dL (ref 0–200)
HDL: 26 mg/dL — ABNORMAL LOW (ref >40)
LDL Cholesterol: 126 mg/dL — ABNORMAL HIGH (ref 0–99)
Total CHOL/HDL Ratio: 7.6 RATIO
Triglycerides: 232 mg/dL — ABNORMAL HIGH (ref <150)
VLDL: 46 mg/dL — ABNORMAL HIGH (ref 0–40)

## 2019-09-05 LAB — BASIC METABOLIC PANEL
Anion gap: 9 (ref 5–15)
BUN: 14 mg/dL (ref 6–20)
CO2: 25 mmol/L (ref 22–32)
Calcium: 9 mg/dL (ref 8.9–10.3)
Chloride: 103 mmol/L (ref 98–111)
Creatinine, Ser: 0.88 mg/dL (ref 0.61–1.24)
GFR calc Af Amer: >60 mL/min (ref >60)
GFR calc non Af Amer: >60 mL/min (ref >60)
Glucose, Bld: 174 mg/dL — ABNORMAL HIGH (ref 70–99)
Potassium: 3.7 mmol/L (ref 3.5–5.1)
Sodium: 137 mmol/L (ref 135–145)

## 2019-09-05 LAB — ECHOCARDIOGRAM COMPLETE
Area-P 1/2: 3.3 cm2
Height: 73 in
P 1/2 time: 545 msec
S' Lateral: 3.5 cm
Weight: 3594.38 oz

## 2019-09-05 LAB — CBC
HCT: 53.5 % — ABNORMAL HIGH (ref 39.0–52.0)
Hemoglobin: 17.1 g/dL — ABNORMAL HIGH (ref 13.0–17.0)
MCH: 30.8 pg (ref 26.0–34.0)
MCHC: 32 g/dL (ref 30.0–36.0)
MCV: 96.4 fL (ref 80.0–100.0)
Platelets: 138 10*3/uL — ABNORMAL LOW (ref 150–400)
RBC: 5.55 MIL/uL (ref 4.22–5.81)
RDW: 14.1 % (ref 11.5–15.5)
WBC: 7.5 10*3/uL (ref 4.0–10.5)
nRBC: 0 % (ref 0.0–0.2)

## 2019-09-05 LAB — GLUCOSE, CAPILLARY
Glucose-Capillary: 188 mg/dL — ABNORMAL HIGH (ref 70–99)
Glucose-Capillary: 188 mg/dL — ABNORMAL HIGH (ref 70–99)
Glucose-Capillary: 257 mg/dL — ABNORMAL HIGH (ref 70–99)
Glucose-Capillary: 176 mg/dL — ABNORMAL HIGH (ref 70–99)
Glucose-Capillary: 232 mg/dL — ABNORMAL HIGH (ref 70–99)

## 2019-09-05 LAB — T4, FREE: Free T4: 1.03 ng/dL (ref 0.61–1.12)

## 2019-09-05 LAB — HEMOGLOBIN A1C
Hgb A1c MFr Bld: 9.6 % — ABNORMAL HIGH (ref 4.8–5.6)
Mean Plasma Glucose: 228.82 mg/dL

## 2019-09-05 LAB — HIV ANTIBODY (ROUTINE TESTING W REFLEX): HIV Screen 4th Generation wRfx: NONREACTIVE

## 2019-09-05 MED ORDER — MORPHINE SULFATE (PF) 2 MG/ML IV SOLN: 1.0000 mg | INTRAVENOUS | Status: DC | PRN

## 2019-09-05 MED ORDER — HYDRALAZINE HCL 20 MG/ML IJ SOLN: 10.0000 mg | Freq: Four times a day (QID) | INTRAMUSCULAR | Status: DC | PRN

## 2019-09-05 MED ORDER — ONDANSETRON HCL 4 MG/2ML IJ SOLN
4.0000 mg | Freq: Three times a day (TID) | INTRAMUSCULAR | Status: DC | PRN
  Administered 2019-09-05: 4 mg via INTRAVENOUS
  Filled 2019-09-05: qty 2

## 2019-09-05 MED ORDER — SODIUM CHLORIDE 0.9 % IV SOLN
1.0000 g | INTRAVENOUS | Status: DC
  Administered 2019-09-05 – 2019-09-06 (×2): 1 g via INTRAVENOUS
  Filled 2019-09-05 (×2): qty 10

## 2019-09-05 MED ORDER — INSULIN ASPART 100 UNIT/ML ~~LOC~~ SOLN
0.0000 [IU] | Freq: Three times a day (TID) | SUBCUTANEOUS | Status: DC
  Administered 2019-09-05: 2 [IU] via SUBCUTANEOUS
  Administered 2019-09-05: 5 [IU] via SUBCUTANEOUS
  Administered 2019-09-05 – 2019-09-06 (×2): 2 [IU] via SUBCUTANEOUS
  Administered 2019-09-06: 3 [IU] via SUBCUTANEOUS

## 2019-09-05 MED ORDER — TRAMADOL HCL 50 MG PO TABS
50.0000 mg | ORAL_TABLET | Freq: Four times a day (QID) | ORAL | Status: DC | PRN
  Administered 2019-09-05: 50 mg via ORAL
  Filled 2019-09-05: qty 1

## 2019-09-05 MED ORDER — INSULIN ASPART 100 UNIT/ML ~~LOC~~ SOLN
0.0000 [IU] | Freq: Every day | SUBCUTANEOUS | Status: DC
  Administered 2019-09-05: 2 [IU] via SUBCUTANEOUS

## 2019-09-05 MED ORDER — ATORVASTATIN CALCIUM 20 MG PO TABS
20.0000 mg | ORAL_TABLET | Freq: Every day | ORAL | Status: DC
  Filled 2019-09-05 (×2): qty 1

## 2019-09-05 MED ORDER — BUTALBITAL-APAP-CAFFEINE 50-325-40 MG PO TABS
1.0000 | ORAL_TABLET | Freq: Four times a day (QID) | ORAL | Status: AC | PRN
  Administered 2019-09-05: 1 via ORAL
  Filled 2019-09-05: qty 1

## 2019-09-05 NOTE — Plan of Care (Signed)
°  Problem: Acute Rehab PT Goals(only PT should resolve) Goal: Pt Will Ambulate Outcome: Progressing Flowsheets (Taken 09/05/2019 1254) Pt will Ambulate:  > 125 feet  Independently Goal: PT Additional Goal #1 Outcome: Progressing Flowsheets (Taken 09/05/2019 1254) Additional Goal #1: Pt will score >19 on DGI to indicate improvement in balance, decreasing risk for falls when ambulating.   Talbot Grumbling PT, DPT 09/05/19, 12:55 PM [phone]

## 2019-09-05 NOTE — Evaluation (Signed)
Physical Therapy Evaluation Patient Details Name: [name] MRN: [id number] DOB: 1960/09/07 Today's Date: 09/05/2019   History of Present Illness  [name] is a 59 y.o. adult with medical history of hypertension, hyperlipidemia, Burkitt's lymphoma in remission, remote cerebellar infarct, left lower extremity DVT, and incidental cerebral aneurysm presenting with gait instability that began when he woke up on 09/02/2019.  The patient was vacationing in [place] at that time.  He decided to wait until he came back home to seek medical care.  The patient denied any word finding difficulty or dysarthria, but he stated occasionally he would have some visual field cuts on his left upper quadrant.  He also had some transient weakness of his upper extremities.  The patient did have a fall but did not hit his head.  He denied any syncope.  He has not had any fevers, chills, chest pain, shortness of breath, coughing, hemoptysis, nausea, vomiting, diarrhea, abdominal pain.  He has not had any dysuria or hematuria.  He has not had any hematochezia or melena.  He states that his gait instability is about the same as on 09/02/2019.  He does feel that his left leg may be a little bit weaker than the right leg.  The patient denies any new medications.  He denies any headache or visual loss. MRI pending  Clinical Impression  Pt admitted with above diagnosis. Pt with slightly decreased LLE strength compared to RLE, noted in dorsiflexion and hip abduction. Pt demonstrates balance deficits while ambulating around room and in hallway, attempting to reach for furniture to steady self initially, narrow BOS due to L foot placement very narrow, decreased L heel strike without foot drop with ataxic like L LE control. Pt loses balance with head turns, speed changes, and direction turn cues, but able to regain balance independently and continue ambulating without falls. Pt with good safety awareness and insight into  limitations allowing for increased time to complete mobility. Pt currently with functional limitations due to the deficits listed below (see PT Problem List). Pt will benefit from skilled PT to increase their independence and safety with mobility to allow discharge to the venue listed below.       Follow Up Recommendations Outpatient PT;Supervision - Intermittent    Equipment Recommendations  None recommended by PT    Recommendations for Other Services       Precautions / Restrictions Precautions Precautions: Fall Restrictions Weight Bearing Restrictions: No      Mobility  Bed Mobility Overal bed mobility: Independent   Transfers Overall transfer level: Independent Equipment used: None  General transfer comment: able to rise from seated EOB without BUE assisting, good steadiness upon standing  Ambulation/Gait Ambulation/Gait assistance: Min guard;Supervision Gait Distance (Feet): 100 Feet Assistive device: None Gait Pattern/deviations: Ataxic;Narrow base of support;Drifts right/left Gait velocity: slightly decreased   General Gait Details: decreased LLE control, decreased L heel strike without foot drop noted, maintains LLE close to midline narrowing BOS; LOB with head turns, direction changes, speed changes, but able to recover independently without physical assistance  Stairs            Wheelchair Mobility    Modified Rankin (Stroke Patients Only)       Balance Overall balance assessment: Needs assistance Sitting-balance support: Feet supported Sitting balance-Leahy Scale: Normal Sitting balance - Comments: seated EOB   Standing balance support: During functional activity;No upper extremity supported Standing balance-Leahy Scale: Fair Standing balance comment: without AD  Pertinent Vitals/Pain Pain Assessment: No/denies pain (tylenol improved headache)    Home Living Family/patient expects to be discharged to:: Private residence Living  Arrangements: Alone Available Help at Discharge: Family;Neighbor;Available PRN/intermittently Type of Home: House Home Access: Stairs to enter Entrance Stairs-Rails: Right;Left;Can reach both Entrance Stairs-Number of Steps: 4 Home Layout: One level Home Equipment: None      Prior Function Level of Independence: Independent         Comments: Pt ambulates community distances without AD, independent with ADLs, works as a full Scientist, 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     Hand Dominance   Dominant Hand: Right    Extremity/Trunk Assessment   Upper Extremity Assessment Upper Extremity Assessment: Defer to OT evaluation    Lower Extremity Assessment Lower Extremity Assessment: Overall WFL for tasks assessed;LLE deficits/detail LLE Deficits / Details: ankle dorsiflexion 4+/5, hip abduction 4+/5; hip adduction, knee extension, knee flexion 5/5 LLE Sensation:  ("feels heavy from knee down to foot") LLE Coordination: decreased gross motor    Cervical / Trunk Assessment Cervical / Trunk Assessment: Normal  Communication   Communication: No difficulties  Cognition Arousal/Alertness: Awake/alert Behavior During Therapy: WFL for tasks assessed/performed Overall Cognitive Status: Within Functional Limits for tasks assessed                                        General Comments      Exercises     Assessment/Plan    PT Assessment Patient needs continued PT services  PT Problem List Decreased activity tolerance;Decreased balance;Decreased coordination;Impaired sensation       PT Treatment Interventions DME instruction;Gait training;Stair training;Functional mobility training;Therapeutic activities;Therapeutic exercise;Balance training;Neuromuscular re-education;Patient/family education    PT Goals (Current goals can be found in the Care Plan section)  Acute Rehab PT Goals Patient Stated Goal: return home PT Goal Formulation: With patient Time For  Goal Achievement: 09/08/19 Potential to Achieve Goals: Good    Frequency Min 5X/week   Barriers to discharge        Co-evaluation               AM-PAC PT "6 Clicks" Mobility  Outcome Measure Help needed turning from your back to your side while in a flat bed without using bedrails?: None Help needed moving from lying on your back to sitting on the side of a flat bed without using bedrails?: None Help needed moving to and from a bed to a chair (including a wheelchair)?: None Help needed standing up from a chair using your arms (e.g., wheelchair or bedside chair)?: None Help needed to walk in hospital room?: A Little Help needed climbing 3-5 steps with a railing? : A Little 6 Click Score: 22    End of Session   Activity Tolerance: Patient tolerated treatment well Patient left: in bed;with call bell/phone within reach Nurse Communication: Mobility status PT Visit Diagnosis: Unsteadiness on feet (R26.81);Other abnormalities of gait and mobility (R26.89);Other symptoms and signs involving the nervous system (O53.664)    Time: 4034-7425 PT Time Calculation (min) (ACUTE ONLY): 21 min   Charges:   PT Evaluation $PT Eval Low Complexity: 1 Low           Tori Latravia [place] PT, DPT 09/05/19, 12:53 PM [phone]

## 2019-09-05 NOTE — ED Notes (Signed)
Ambulated pt to BR, legs do appear uncoordinated and pt wobbles, denies pain or numbness, states "I'm just having a hard time walking, so I hold on to things." Advised to ask for assistance when ambulating.

## 2019-09-05 NOTE — Evaluation (Signed)
Occupational Therapy Evaluation Patient Details Name: [name] MRN: [id number] DOB: 12/23/1960 Today's Date: 09/05/2019    History of Present Illness [name] is a 59 y.o. adult with medical history of hypertension, hyperlipidemia, Burkitt's lymphoma in remission, remote cerebellar infarct, left lower extremity DVT, and incidental cerebral aneurysm presenting with gait instability that began when he woke up on 09/02/2019.  The patient was vacationing in [place] at that time.  He decided to wait until he came back home to seek medical care.  The patient denied any word finding difficulty or dysarthria, but he stated occasionally he would have some visual field cuts on his left upper quadrant.  He also had some transient weakness of his upper extremities.  The patient did have a fall but did not hit his head.  He denied any syncope.  He has not had any fevers, chills, chest pain, shortness of breath, coughing, hemoptysis, nausea, vomiting, diarrhea, abdominal pain.  He has not had any dysuria or hematuria.  He has not had any hematochezia or melena.  He states that his gait instability is about the same as on 09/02/2019.  He does feel that his left leg may be a little bit weaker than the right leg.  The patient denies any new medications.  He denies any headache or visual loss. MRI pending   Clinical Impression   Pt agreeable to OT evaluation this am, reports improvement in mobility since arrival. Pt demonstrates LUE strength WNL, coordination and sensation are intact. Pt performing ADLs with supervision for standing tasks, slightly ataxic gait this am during functional mobility, no LOB or need for UE support. Reports feels like increased focus required for mobility but it is improved since onset. Pt is at baseline for ADL completion, no further OT needs at this time.     Follow Up Recommendations  No OT follow up    Equipment Recommendations  None recommended by OT       Precautions /  Restrictions Precautions Precautions: None Restrictions Weight Bearing Restrictions: No      Mobility Bed Mobility Overal bed mobility: Independent                Transfers Overall transfer level: Modified independent Equipment used: None                      ADL either performed or assessed with clinical judgement   ADL Overall ADL's : Needs assistance/impaired     Grooming: Wash/dry hands;Supervision/safety;Standing Grooming Details (indicate cue type and reason): performing ADLs at sink, no difficulties             Lower Body Dressing: Modified independent;Sitting/lateral leans Lower Body Dressing Details (indicate cue type and reason): donning shoes while at EOB Toilet Transfer: Supervision/safety;Ambulation           Functional mobility during ADLs: Supervision/safety General ADL Comments: Pt performing ADLs at supervision level for standing tasks. No LOB during tasks     Vision Baseline Vision/History: Wears glasses Wears Glasses: At all times Patient Visual Report: No change from baseline Vision Assessment?: No apparent visual deficits            Pertinent Vitals/Pain Pain Assessment: 0-10 Pain Score: 3  Pain Location: head Pain Descriptors / Indicators: Headache Pain Intervention(s): Limited activity within patient's tolerance;Monitored during session;Patient requesting pain meds-RN notified     Hand Dominance Right   Extremity/Trunk Assessment Upper Extremity Assessment Upper Extremity Assessment: Overall WFL for tasks assessed  Lower Extremity Assessment Lower Extremity Assessment: Defer to PT evaluation   Cervical / Trunk Assessment Cervical / Trunk Assessment: Normal   Communication Communication Communication: No difficulties   Cognition Arousal/Alertness: Awake/alert Behavior During Therapy: WFL for tasks assessed/performed Overall Cognitive Status: Within Functional Limits for tasks assessed                                                 Home Living Family/patient expects to be discharged to:: Private residence Living Arrangements: Alone Available Help at Discharge: Family;Available PRN/intermittently Type of Home: House Home Access: Stairs to enter [employer] of Steps: 4 Entrance Stairs-Rails: Right;Left;Can reach both Home Layout: One level     Bathroom Shower/Tub: Teacher, early years/pre: Standard     Home Equipment: None          Prior Functioning/Environment Level of Independence: Independent        Comments: independent in mobility, ADLs, works as a Dealer for [employer]        OT Problem List: Decreased activity tolerance;Impaired balance (sitting and/or standing)       AM-PAC OT "6 Clicks" Daily Activity     Outcome Measure Help from another person eating meals?: None Help from another person taking care of personal grooming?: None Help from another person toileting, which includes using toliet, bedpan, or urinal?: None Help from another person bathing (including washing, rinsing, drying)?: None Help from another person to put on and taking off regular upper body clothing?: None Help from another person to put on and taking off regular lower body clothing?: None 6 Click Score: 24   End of Session    Activity Tolerance: Patient tolerated treatment well Patient left: in chair;with call bell/phone within reach  OT Visit Diagnosis: Muscle weakness (generalized) (M62.81);Other abnormalities of gait and mobility (R26.89)                Time: 0076-2263 OT Time Calculation (min): 22 min Charges:  OT General Charges $OT Visit: 1 Visit OT Evaluation $OT Eval Low Complexity: [place], OTR/L  [phone] 09/05/2019, 8:15 AM

## 2019-09-05 NOTE — Progress Notes (Signed)
*  PRELIMINARY RESULTS* Echocardiogram 2D Echocardiogram has been performed.  Leavy Cella 09/05/2019, 2:36 PM

## 2019-09-05 NOTE — Progress Notes (Signed)
Patient off floor in MRI, ultrasound, unable to get vitals for q 2 hours for 1100 hour

## 2019-09-05 NOTE — Consult Note (Signed)
[place] A. Merlene Laughter, MD     [email or web address]          [name] is an 59 y.o. adult.   ASSESSMENT/PLAN: 1.  Acute onset of gait impairment/gait ataxia and photophobia with examination showing mild left-sided hemiataxia.  The findings in suggest a small infarct involving the brainstem or cerebellum on the left side.  The patient also has a UTI which could cause gait ataxia.  It is possible that the UTI may have aggravated symptoms from a prior infarct she has had seen on imaging showing a remote left cerebellar infarct.  More definitive imaging is scheduled.  For now we will continue with Xarelto anticoagulation.  Physical and occupational therapies are recommended.  Continue with the other medications and risk factor modification.    The patient is a 59 year old white male who presents with the acute onset of gait instability.  He denies any focal numbness or weakness.  He denies any dysarthria dysphagia or diplopia.  He does report having photophobia and what appears to be visual obscuration/graying of his vision involving the left side.  The photophobia and visual symptoms have resolved but he still has gait instability.  He denies any palpitation, dyspnea or chest pain.  There are no reports of headaches.  Review of systems otherwise negative.    GENERAL: Patient is sleeping but easily arousable.  HEENT: Neck is supple no trauma noted.  ABDOMEN: soft  EXTREMITIES: No edema   BACK: Normal  SKIN: Normal by inspection.    MENTAL STATUS: Alert and oriented. Speech, language and cognition are generally intact. Judgment and insight normal.   CRANIAL NERVES: Pupils are equal, round and reactive to light and accomodation; extra ocular movements are full, there is no significant nystagmus; visual fields are full; upper and lower facial muscles are normal in strength and symmetric, there is no flattening of the nasolabial folds; tongue is midline; uvula is  midline; shoulder elevation is normal.  MOTOR: Normal tone, bulk and strength; no pronator drift.  There is no drift of the upper or lower extremities.  COORDINATION: There is mild dysmetria involving the left upper extremity and left leg.  The right side is normal.  No rest tremor; no intention tremor; no postural tremor; no bradykinesia.  REFLEXES: Deep tendon reflexes are symmetrical and normal.   SENSATION: Normal to light touch, temperature, and pain.  There is no extinction on double simultaneous stimulation.   NIH stroke scale 2.   Blood pressure (!) 143/93, pulse 62, temperature 98.2 F (36.8 C), temperature source Oral, resp. rate 20, height 6\' 1"  (1.854 m), weight 101.9 kg, SpO2 96 %.  Past Medical History:  Diagnosis Date   Abnormal liver function test 08/31/2011   Mild elevation bilirubin, normal/high hemoglobin, normal LDH   Adult ADHD    Aneurysm ([place]) 10/23/2014   53mm left anterior communicating artery region, noted on MRI head   Anxiety    Burkitt's lymphoma ([place])    followed by Dr. Julieanne Manson   Cerebellar infarct [facility]) 10/23/2014   Chronic, noted on MRI head   Chest pain    Depression    DVT (deep venous thrombosis) ([place]) 12/2015   Left leg   Elevated PSA    Glaucoma    Headache    History of double vision 2016   HTN (hypertension), benign 08/31/2011   Hyperlipidemia    Hypotestosteronemia    Polycythemia, secondary 08/31/2011   Likely due to testosterone injections   White matter  disease 10/23/2014   noted on MRI head, "extensive"    Past Surgical History:  Procedure Laterality Date   CEREBRAL ANGIOGRAM  11/23/2014   HERNIA REPAIR     KNEE SURGERY Left    SHOULDER SURGERY      Family History  Problem Relation Age of Onset   Stroke Father        Deceased   Hypertension Mother        Living    Social History:  reports that she has never smoked. She has never used smokeless tobacco. She reports current alcohol use.  She reports that she does not use drugs.  Allergies:  Allergies  Allergen Reactions   Codeine Nausea Only   Hydrocodone-Acetaminophen Other (See Comments)    Makes patient sick   Vicodin [Hydrocodone-Acetaminophen] Nausea And Vomiting    Medications: Prior to Admission medications   Medication Sig Start Date End Date Taking? Authorizing Provider  dextroamphetamine (DEXTROSTAT) 5 MG tablet Take 5 mg by mouth 3 (three) times daily.   Yes [provider]  dorzolamide-timolol (COSOPT) 22.3-6.8 MG/ML ophthalmic solution Place 1 drop into the right eye 2 (two) times daily. 08/13/19  Yes [provider]  latanoprost (XALATAN) 0.005 % ophthalmic solution Apply 1 drop to eye at bedtime.   Yes [provider]  lisinopril (PRINIVIL,ZESTRIL) 40 MG tablet Take 40 mg by mouth daily.  08/07/14  Yes [provider]  omeprazole (PRILOSEC) 20 MG capsule Take 20 mg by mouth daily.   Yes [provider]  Testosterone 20.25 MG/ACT (1.62%) GEL APPLY ONE PUMP EACH UPPER ARM ONCE DAILY E29.1 10/23/18  Yes [provider]  valACYclovir (VALTREX) 1000 MG tablet Take 1,000 mg by mouth daily.   Yes [provider]  XARELTO 20 MG TABS tablet Take 20 mg by mouth daily.  08/22/16  Yes [provider]  sildenafil (VIAGRA) 100 MG tablet Take 100 mg by mouth daily as needed for erectile dysfunction.     [provider]    Scheduled Meds:   stroke: mapping our early stages of recovery book   Does not apply Once   dorzolamide-timolol  1 drop Right Eye BID   latanoprost  1 drop Right Eye QHS   pantoprazole  40 mg Oral Daily   rivaroxaban  20 mg Oral Q breakfast   valACYclovir  1,000 mg Oral Daily   Continuous Infusions:  sodium chloride     PRN Meds:.acetaminophen **OR** acetaminophen (TYLENOL) oral liquid 160 mg/5 mL **OR** acetaminophen, senna-docusate     Results for orders placed or performed during the hospital encounter  of 09/04/19 (from the past 48 hour(s))  CBC with Differential/Platelet     Status: Abnormal   Collection Time: 09/04/19 10:52 AM  Result Value Ref Range   WBC 6.9 4.0 - 10.5 K/uL   RBC 5.69 4.22 - 5.81 MIL/uL   Hemoglobin 17.8 (H) 13.0 - 17.0 g/dL   HCT 54.3 (H) 39 - 52 %   MCV 95.4 80.0 - 100.0 fL   MCH 31.3 26.0 - 34.0 pg   MCHC 32.8 30.0 - 36.0 g/dL   RDW 14.3 11.5 - 15.5 %   Platelets 144 (L) 150 - 400 K/uL   nRBC 0.0 0.0 - 0.2 %   Neutrophils Relative % 75 %   Neutro Abs 5.2 1.7 - 7.7 K/uL   Lymphocytes Relative 18 %   Lymphs Abs 1.2 0.7 - 4.0 K/uL   Monocytes Relative 6 %   Monocytes Absolute  0.4 0 - 1 K/uL   Eosinophils Relative 1 %   Eosinophils Absolute 0.1 0 - 0 K/uL   Basophils Relative 0 %   Basophils Absolute 0.0 0 - 0 K/uL   Immature Granulocytes 0 %   Abs Immature Granulocytes 0.02 0.00 - 0.07 K/uL    Comment: Performed at [facility], [address] [place], [place] [address]  Basic metabolic panel     Status: Abnormal   Collection Time: 09/04/19 10:52 AM  Result Value Ref Range   Sodium 137 135 - 145 mmol/L   Potassium 4.0 3.5 - 5.1 mmol/L   Chloride 102 98 - 111 mmol/L   CO2 26 22 - 32 mmol/L   Glucose, Bld 223 (H) 70 - 99 mg/dL    Comment: Glucose reference range applies only to samples taken after fasting for at least 8 hours.   BUN 14 6 - 20 mg/dL   Creatinine, Ser 1.06 0.61 - 1.24 mg/dL   Calcium 9.4 8.9 - 10.3 mg/dL   GFR calc non Af Amer >60 >60 mL/min   GFR calc Af Amer >60 >60 mL/min   Anion gap 9 5 - 15    Comment: Performed at [facility], [address] [place], [place] [address]  Ethanol     Status: None   Collection Time: 09/04/19 10:52 AM  Result Value Ref Range   Alcohol, Ethyl (B) <10 <10 mg/dL    Comment: (NOTE) Lowest detectable limit for serum alcohol is 10 mg/dL.  For medical purposes only. Performed at [facility], [address] [place], [place] [address]   Protime-INR     Status: Abnormal   Collection Time: 09/04/19  10:52 AM  Result Value Ref Range   Prothrombin Time 24.0 (H) 11.4 - 15.2 seconds   INR 2.2 (H) 0.8 - 1.2    Comment: (NOTE) INR goal varies based on device and disease states. Performed at [facility], [address], [place], [place] [address]   APTT     Status: None   Collection Time: 09/04/19 10:52 AM  Result Value Ref Range   aPTT 36 24 - 36 seconds    Comment: Performed at [facility], [address], [place], [place] [address]  CBG monitoring, ED     Status: Abnormal   Collection Time: 09/04/19 10:56 AM  Result Value Ref Range   Glucose-Capillary 249 (H) 70 - 99 mg/dL    Comment: Glucose reference range applies only to samples taken after fasting for at least 8 hours.  Urinalysis, Routine w reflex microscopic     Status: Abnormal   Collection Time: 09/04/19 11:15 AM  Result Value Ref Range   Color, Urine YELLOW YELLOW   APPearance HAZY (A) CLEAR   Specific Gravity, Urine 1.020 1.005 - 1.030   pH 5.0 5.0 - 8.0   Glucose, UA >=500 (A) NEGATIVE mg/dL   Hgb urine dipstick SMALL (A) NEGATIVE   Bilirubin Urine NEGATIVE NEGATIVE   Ketones, ur NEGATIVE NEGATIVE mg/dL   Protein, ur 30 (A) NEGATIVE mg/dL   Nitrite POSITIVE (A) NEGATIVE   Leukocytes,Ua SMALL (A) NEGATIVE   RBC / HPF 0-5 0 - 5 RBC/hpf   WBC, UA >50 (H) 0 - 5 WBC/hpf   Bacteria, UA MANY (A) NONE SEEN   Mucus PRESENT    Budding Yeast PRESENT     Comment: Performed at [facility], [address] [place], [place] [address]  Urine rapid drug screen (hosp performed)     Status:  None   Collection Time: 09/04/19  2:12 PM  Result Value Ref Range   Opiates NONE DETECTED NONE DETECTED   Cocaine NONE DETECTED NONE DETECTED   Benzodiazepines NONE DETECTED NONE DETECTED   Amphetamines NONE DETECTED NONE DETECTED   Tetrahydrocannabinol NONE DETECTED NONE DETECTED   Barbiturates NONE DETECTED NONE DETECTED    Comment: (NOTE) DRUG SCREEN FOR MEDICAL PURPOSES ONLY.  IF CONFIRMATION IS NEEDED FOR ANY PURPOSE, NOTIFY  LAB WITHIN 5 DAYS.  LOWEST DETECTABLE LIMITS FOR URINE DRUG SCREEN Drug Class                     Cutoff (ng/mL) Amphetamine and metabolites    1000 Barbiturate and metabolites    200 Benzodiazepine                 275 Tricyclics and metabolites     300 Opiates and metabolites        300 Cocaine and metabolites        300 THC                            50 Performed at [facility], [address] [place], [place] [address]   SARS Coronavirus 2 by RT PCR (hospital order, performed in [facility] hospital lab) Nasopharyngeal Nasopharyngeal Swab     Status: None   Collection Time: 09/04/19  2:22 PM   Specimen: Nasopharyngeal Swab  Result Value Ref Range   SARS Coronavirus 2 NEGATIVE NEGATIVE    Comment: (NOTE) SARS-CoV-2 target nucleic acids are NOT DETECTED.  The SARS-CoV-2 RNA is generally detectable in upper and lower respiratory specimens during the acute phase of infection. The lowest concentration of SARS-CoV-2 viral copies this assay can detect is 250 copies / mL. A negative result does not preclude SARS-CoV-2 infection and should not be used as the sole basis for treatment or other patient management decisions.  A negative result may occur with improper specimen collection / handling, submission of specimen other than nasopharyngeal swab, presence of viral mutation(s) within the areas targeted by this assay, and inadequate number of viral copies (<250 copies / mL). A negative result must be combined with clinical observations, patient history, and epidemiological information.  Fact Sheet for Patients:   StrictlyIdeas.no  Fact Sheet for Healthcare Providers: BankingDealers.co.za  This test is not yet approved or  cleared by the [place] FDA and has been authorized for detection and/or diagnosis of SARS-CoV-2 by FDA under an Emergency Use Authorization (EUA).  This EUA will remain in effect (meaning this test can be  used) for the duration of the COVID-19 declaration under Section 564(b)(1) of the Act, 21 U.S.C. section 360bbb-3(b)(1), unless the authorization is terminated or revoked sooner.  Performed at [facility], [address], [place], [place] [address]   Vitamin B12     Status: None   Collection Time: 09/04/19  5:58 PM  Result Value Ref Range   Vitamin B-12 401 180 - 914 pg/mL    Comment: (NOTE) This assay is not validated for testing neonatal or myeloproliferative syndrome specimens for Vitamin B12 levels. Performed at [facility], [address], [place], [place] [address]   Folate     Status: None   Collection Time: 09/04/19  5:58 PM  Result Value Ref Range   Folate 17.9 >5.9 ng/mL    Comment: Performed at [facility], [address] [place],  [address]  TSH  Status: None   Collection Time: 09/04/19  5:58 PM  Result Value Ref Range   TSH 0.879 0.350 - 4.500 uIU/mL    Comment: Performed by a 3rd Generation assay with a functional sensitivity of <=0.01 uIU/mL. Performed at [facility], [address], [place], [place] [address]     Studies/Results:  Head CT  FINDINGS: Brain: Mild chronic ischemic white matter disease is noted. No mass effect or midline shift is noted. Ventricular size is within normal limits. There is no evidence of mass lesion, hemorrhage or acute infarction.  Vascular: No hyperdense vessel or unexpected calcification.  Skull: Normal. Negative for fracture or focal lesion.  Sinuses/Orbits: No acute finding.  Other: None.  IMPRESSION: Mild chronic ischemic white matter disease. No acute intracranial abnormality seen.      [name] A. Merlene Laughter, [name]  Diplomate, Tax adviser of Psychiatry and Neurology ( Neurology). 09/05/2019, 6:40 AM

## 2019-09-05 NOTE — Progress Notes (Signed)
[facility]                                                                              Patient Demographics  [name], is a 59 y.o. adult, DOB - Oct 14, 1960, GBT:[id number]  Admit date - 09/04/2019   Admitting Physician Orson Eva, MD  Outpatient Primary MD for the patient is Velna Hatchet, MD  Outpatient specialists:   LOS - 0  days   Medical records reviewed and are as summarized below:    Chief Complaint  Patient presents with  . Weakness       Brief summary   Patient is a 59 year old male with hypertension, hyperlipidemia, history of Burkitt's lymphoma in remission, remote cerebellar CVA, left lower extremity DVT, incidental cerebral aneurysm presented with ataxia and gait instability when he woke up on 09/02/2019.  Patient was on a vacation in [place] at that time, decided to wait until he returned back home to seek medical care.  Patient denied any dysarthria but reported occasionally having some visual field cuts on his left eye upper quadrant.  He also had some transient weakness of his upper extremities.  Patient did have a fall but did not hit his head, denied any syncope.  He feels that his left leg may be a little bit weaker than the right, no new medications.  No headache or visual loss.  In ED, afebrile, O2 97 -100% on room air, UA positive for UTI.  CT brain negative for acute findings  Assessment & Plan    Principal Problem:   Ataxia with visual change, transient weakness of upper extremities. -CT head negative, will await MRI, MRA for further work-up. -Neurology consulted, discussed with Dr. Merlene Laughter, follow imaging, 2D echo (prior echo in 2016, EF 50 to 55%) -PT OT evaluation -Continue Xarelto -LDL 126, goal less than 70,  -follow  HbA1c (per patient, he was on Metformin before however did not tolerated well so discontinued), fasting CBG 174 this morning  Active Problems:   HTN (hypertension), benign -BP currently stable, hold  antihypertensives for permissive hypertension  Hyperlipidemia -LDL 126, started on Lipitor 20 mg daily goal less than 70    Polycythemia, secondary, thrombocytopenia -Currently no acute issues, outpatient follow-up with hematology    Diabetes mellitus ([place]), type II, NIDDM, uncontrolled with hyperglycemia -Patient reported that he was on Metformin in the past however did not tolerated well, has been following his diet -Obtain hemoglobin A1c, given possibility of acute CVA, will need better glycemic control. -Placed on carb modified diet, sliding scale insulin.  Will need oral hypoglycemic at the time of discharge.    Acute lower UTI -May have exacerbated the weakness, #1, follow urine culture and sensitivities -Received cephalexin in ED, placed on IV Rocephin, narrow antibiotics per sensitivities  GERD Continue PPI  History of left lower extremity DVT Continue Xarelto  Code Status: Full code DVT Prophylaxis: Xarelto Family Communication: Discussed all imaging results, lab results, explained to the patient. Called patientt's wife on phone x2, unable to make contact.    Disposition Plan:     Status is: Observation  The patient remains OBS appropriate and will  d/c before 2 midnights.  Dispo: The patient is from: Home              Anticipated d/c is to: Home              Anticipated d/c date is: 1 day              Patient currently is not medically stable to d/c.  Awaiting full stroke work-up, 2D echo   Time Spent in minutes   35 minutes  Procedures:  MRI, MRA brain, 2D echo pending Consultants:   Neurology Antimicrobials:   Anti-infectives (From admission, onward)   Start     Dose/Rate Route Frequency Ordered Stop   09/05/19 1200  cefTRIAXone (ROCEPHIN) 1 g in sodium chloride 0.9 % 100 mL IVPB     Discontinue     1 g 200 mL/hr over 30 Minutes Intravenous Every 24 hours 09/05/19 1159     09/04/19 1745  valACYclovir (VALTREX) tablet 1,000 mg     Discontinue     1,000 mg  Oral Daily 09/04/19 1744     09/04/19 1330  cephALEXin (KEFLEX) capsule 500 mg        500 mg Oral  Once 09/04/19 1323 09/04/19 1329         Medications  Scheduled Meds: . atorvastatin  20 mg Oral Daily  . dorzolamide-timolol  1 drop Right Eye BID  . insulin aspart  0-5 Units Subcutaneous QHS  . insulin aspart  0-9 Units Subcutaneous TID WC  . latanoprost  1 drop Right Eye QHS  . pantoprazole  40 mg Oral Daily  . rivaroxaban  20 mg Oral Q breakfast  . valACYclovir  1,000 mg Oral Daily   Continuous Infusions: . sodium chloride    . cefTRIAXone (ROCEPHIN)  IV     PRN Meds:.acetaminophen **OR** [DISCONTINUED] acetaminophen (TYLENOL) oral liquid 160 mg/5 mL **OR** [DISCONTINUED] acetaminophen, senna-docusate      Subjective:   [name] was seen and examined today.  States over all still feels somewhat dizzy and wobbly, not at baseline.  However no focal numbness or weakness, no slurred speech. Patient denies chest pain, shortness of breath, abdominal pain, N/V/D/C.  No fevers.   Objective:   Vitals:   09/05/19 0107 09/05/19 0139 09/05/19 0342 09/05/19 0700  BP: 118/74 (!) 131/91 (!) 143/93 134/85  Pulse: 86 61 62 67  Resp: 14 18 20 18   Temp: 98.4 F (36.9 C) (!) 97.5 F (36.4 C) 98.2 F (36.8 C) 98.6 F (37 C)  TempSrc: Oral Oral Oral Oral  SpO2: 99% 98% 96% 93%  Weight:  101.9 kg    Height:  6\' 1"  (1.854 m)     No intake or output data in the 24 hours ending 09/05/19 1201   Wt Readings from Last 3 Encounters:  09/05/19 101.9 kg  11/01/18 105.8 kg  12/15/17 106.1 kg     Exam  General: Alert and oriented x 3, NAD  Cardiovascular: S1 S2 auscultated, no murmurs, RRR  Respiratory: Clear to auscultation bilaterally, no wheezing, rales or rhonchi  Gastrointestinal: Soft, nontender, nondistended, + bowel sounds  Ext: no pedal edema bilaterally  Neuro: Strength 5/5 upper and lower extremities bilaterally, gait not assessed, no resting  tremor  Musculoskeletal: No digital cyanosis, clubbing  Skin: No rashes  Psych: Normal affect and demeanor, alert and oriented x3    Data Reviewed:  I have personally reviewed following labs and imaging studies  Micro Results Recent Results (from the  past 240 hour(s))  SARS Coronavirus 2 by RT PCR (hospital order, performed in [facility] hospital lab) Nasopharyngeal Nasopharyngeal Swab     Status: None   Collection Time: 09/04/19  2:22 PM   Specimen: Nasopharyngeal Swab  Result Value Ref Range Status   SARS Coronavirus 2 NEGATIVE NEGATIVE Final    Comment: (NOTE) SARS-CoV-2 target nucleic acids are NOT DETECTED.  The SARS-CoV-2 RNA is generally detectable in upper and lower respiratory specimens during the acute phase of infection. The lowest concentration of SARS-CoV-2 viral copies this assay can detect is 250 copies / mL. A negative result does not preclude SARS-CoV-2 infection and should not be used as the sole basis for treatment or other patient management decisions.  A negative result may occur with improper specimen collection / handling, submission of specimen other than nasopharyngeal swab, presence of viral mutation(s) within the areas targeted by this assay, and inadequate number of viral copies (<250 copies / mL). A negative result must be combined with clinical observations, patient history, and epidemiological information.  Fact Sheet for Patients:   StrictlyIdeas.no  Fact Sheet for Healthcare Providers: BankingDealers.co.za  This test is not yet approved or  cleared by the [place] FDA and has been authorized for detection and/or diagnosis of SARS-CoV-2 by FDA under an Emergency Use Authorization (EUA).  This EUA will remain in effect (meaning this test can be used) for the duration of the COVID-19 declaration under Section 564(b)(1) of the Act, 21 U.S.C. section 360bbb-3(b)(1), unless the authorization is  terminated or revoked sooner.  Performed at [facility], [address], [place], [place] [address]     Radiology Reports DG Chest 2 View  Result Date: 09/04/2019 CLINICAL DATA:  Weakness. EXAM: CHEST - 2 VIEW COMPARISON:  August 11, 2014 FINDINGS: The heart size and mediastinal contours are within normal limits. Both lungs are clear. The visualized skeletal structures are unremarkable. IMPRESSION: No active cardiopulmonary disease. Electronically Signed   By: Constance Holster [name]   On: 09/04/2019 18:38   CT Head Wo Contrast  Result Date: 09/04/2019 CLINICAL DATA:  Left-sided weakness. EXAM: CT HEAD WITHOUT CONTRAST TECHNIQUE: Contiguous axial images were obtained from the base of the skull through the vertex without intravenous contrast. COMPARISON:  None. FINDINGS: Brain: Mild chronic ischemic white matter disease is noted. No mass effect or midline shift is noted. Ventricular size is within normal limits. There is no evidence of mass lesion, hemorrhage or acute infarction. Vascular: No hyperdense vessel or unexpected calcification. Skull: Normal. Negative for fracture or focal lesion. Sinuses/Orbits: No acute finding. Other: None. IMPRESSION: Mild chronic ischemic white matter disease. No acute intracranial abnormality seen. Electronically Signed   By: Marijo Conception [name]   On: 09/04/2019 12:29    Lab Data:  CBC: Recent Labs  Lab 09/04/19 1052 09/05/19 0543  WBC 6.9 7.5  NEUTROABS 5.2  --   HGB 17.8* 17.1*  HCT 54.3* 53.5*  MCV 95.4 96.4  PLT 144* 505*   Basic Metabolic Panel: Recent Labs  Lab 09/04/19 1052 09/05/19 0543  NA 137 137  K 4.0 3.7  CL 102 103  CO2 26 25  GLUCOSE 223* 174*  BUN 14 14  CREATININE 1.06 0.88  CALCIUM 9.4 9.0   GFR: Estimated Creatinine Clearance (by C-G formula based on SCr of 0.88 mg/dL) Male: 93.5 mL/min Male: 113.4 mL/min Liver Function Tests: No results for input(s): AST, ALT, ALKPHOS, BILITOT, PROT, ALBUMIN in the last 168  hours. No results for input(s):  LIPASE, AMYLASE in the last 168 hours. No results for input(s): AMMONIA in the last 168 hours. Coagulation Profile: Recent Labs  Lab 09/04/19 1052  INR 2.2*   Cardiac Enzymes: No results for input(s): CKTOTAL, CKMB, CKMBINDEX, TROPONINI in the last 168 hours. BNP (last 3 results) No results for input(s): PROBNP in the last 8760 hours. HbA1C: No results for input(s): HGBA1C in the last 72 hours. CBG: Recent Labs  Lab 09/04/19 1056 09/05/19 0757  GLUCAP 249* 188*  188*   Lipid Profile: Recent Labs    09/05/19 0543  CHOL 198  HDL 26*  LDLCALC 126*  TRIG 232*  CHOLHDL 7.6   Thyroid Function Tests: Recent Labs    09/04/19 1758  TSH 0.879  FREET4 1.03   Anemia Panel: Recent Labs    09/04/19 1758  VITAMINB12 401  FOLATE 17.9   Urine analysis:    Component Value Date/Time   COLORURINE YELLOW 09/04/2019 1115   APPEARANCEUR HAZY (A) 09/04/2019 1115   LABSPEC 1.020 09/04/2019 1115   PHURINE 5.0 09/04/2019 1115   GLUCOSEU >=500 (A) 09/04/2019 1115   HGBUR SMALL (A) 09/04/2019 1115   BILIRUBINUR NEGATIVE 09/04/2019 1115   KETONESUR NEGATIVE 09/04/2019 1115   PROTEINUR 30 (A) 09/04/2019 1115   NITRITE POSITIVE (A) 09/04/2019 1115   LEUKOCYTESUR SMALL (A) 09/04/2019 1115     Moani Weipert [name] [facility] 09/05/2019, 12:01 PM   Call night coverage person covering after 7pm

## 2019-09-05 NOTE — Progress Notes (Signed)
SLP Cancellation Note  Patient Details Name: [name] MRN: [id number] DOB: 1960/06/30   Cancelled treatment:       Reason Eval/Treat Not Completed: SLP screened, no needs identified, will sign off; SLP screened Pt in room. Pt denies any changes in swallowing, speech, language, or cognition. MRI results are pending. SLE will be deferred at this time. Reconsult if indicated. SLP will sign off.   Thank you,  Genene Churn, [place]  [place] 09/05/2019, 2:29 PM

## 2019-09-06 ENCOUNTER — Encounter (HOSPITAL_COMMUNITY): Payer: Self-pay | Admitting: Internal Medicine

## 2019-09-06 DIAGNOSIS — E785 Hyperlipidemia, unspecified: Secondary | ICD-10-CM

## 2019-09-06 LAB — GLUCOSE, CAPILLARY
Glucose-Capillary: 213 mg/dL — ABNORMAL HIGH (ref 70–99)
Glucose-Capillary: 174 mg/dL — ABNORMAL HIGH (ref 70–99)

## 2019-09-06 MED ORDER — ATORVASTATIN CALCIUM 40 MG PO TABS: 40.0000 mg | ORAL_TABLET | Freq: Every evening | ORAL | Status: DC

## 2019-09-06 MED ORDER — LINAGLIPTIN 5 MG PO TABS: 5.0000 mg | ORAL_TABLET | Freq: Every day | ORAL | 1 refills | Status: DC

## 2019-09-06 MED ORDER — CEFDINIR 300 MG PO CAPS
600.0000 mg | ORAL_CAPSULE | Freq: Every day | ORAL | 0 refills | Status: AC
Start: 2019-09-07 — End: 2019-09-10

## 2019-09-06 MED ORDER — ATORVASTATIN CALCIUM 40 MG PO TABS: 40.0000 mg | ORAL_TABLET | Freq: Every evening | ORAL | 1 refills | Status: DC

## 2019-09-06 MED ORDER — GLIPIZIDE 5 MG PO TABS: 5.0000 mg | ORAL_TABLET | Freq: Two times a day (BID) | ORAL | 0 refills | Status: DC

## 2019-09-06 NOTE — Progress Notes (Signed)
Pt c/o "migraine-like" headache, rating pain 10/10. Notified on call M. Sharlet Salina to make aware. Administered fioricet and zofran as ordered. Effective within the hr.

## 2019-09-06 NOTE — Discharge Summary (Addendum)
Physician Discharge Summary  [name] UXL:[id number] DOB: March 09, 1960 DOA: 09/04/2019  PCP: Velna Hatchet, MD Neurology: Merlene Laughter  Admit date: 09/04/2019 Discharge date: 09/06/2019  Admitted From:  Home  Disposition:  Home   Recommendations for Outpatient Follow-up:  1. Follow up with PCP in 1 weeks 2. Follow up with neurologist in 1 month 3. Establish care with endocrinology 4. Please follow up on the following pending results: final urine culture results  Discharge Condition: STABLE   CODE STATUS: FULL    Brief Hospitalization Summary: Please see all hospital notes, images, labs for full details of the hospitalization. ADMISSION HPI:  [name] is a 59 y.o. adult with medical history of hypertension, hyperlipidemia, Burkitt's lymphoma in remission, remote cerebellar infarct, left lower extremity DVT, and incidental cerebral aneurysm presenting with gait instability that began when he woke up on 09/02/2019.  The patient was vacationing in [place] at that time.  He decided to wait until he came back home to seek medical care.  The patient denied any word finding difficulty or dysarthria, but he stated occasionally he would have some visual field cuts on his left upper quadrant.  He also had some transient weakness of his upper extremities.  The patient did have a fall but did not hit his head.  He denied any syncope.  He has not had any fevers, chills, chest pain, shortness of breath, coughing, hemoptysis, nausea, vomiting, diarrhea, abdominal pain.  He has not had any dysuria or hematuria.  He has not had any hematochezia or melena.  He states that his gait instability is about the same as on 09/02/2019.  He does feel that his left leg may be a little bit weaker than the right leg.  The patient denies any new medications.  He denies any headache or visual loss. In the emergency department, the patient was afebrile hemodynamically stable with oxygen saturation 97-100% on room air.   BMP and CBC were unremarkable except for platelets of 144,000.  UA showed >50 WBC.  CT of the brain was negative for acute findings.  Brief summary   Patient is a 59 year old male with hypertension, hyperlipidemia, history of Burkitt's lymphoma in remission, remote cerebellar CVA, left lower extremity DVT, incidental cerebral aneurysm presented with ataxia and gait instability when he woke up on 09/02/2019.  Patient was on a vacation in [place] at that time, decided to wait until he returned back home to seek medical care.  Patient denied any dysarthria but reported occasionally having some visual field cuts on his left eye upper quadrant.  He also had some transient weakness of his upper extremities.  Patient did have a fall but did not hit his head, denied any syncope.  He feels that his left leg may be a little bit weaker than the right, no new medications.  No headache or visual loss.  In ED, afebrile, O2 97 -100% on room air, UA positive for UTI.  CT brain negative for acute findings  Assessment & Plan    Principal Problem: Acute CVA involving putamen/internal capsule presented with Ataxia with visual change, transient weakness of upper extremities. -CT head negative, will await MRI, MRA for further work-up. -Neurology consulted, discussed with Dr. Merlene Laughter, follow imaging, 2D echo (prior echo in 2016, EF 50 to 55%) -PT OT evaluation -Continue Xarelto per neurology recommendations, outpatient follow up with neurology recommended -LDL 126, goal less than 70,  -HbA1c 9.6%  (per patient, he was on Metformin before however did not  tolerated well so discontinued), fasting CBG 174 this morning  Carotid dopplers: IMPRESSION: 1. No significant atherosclerotic plaque or stenosis in either internal carotid artery. 2. The vertebral arteries are patent with normal antegrade flow.  2D Echocardiogram IMPRESSIONS  1. Left ventricular ejection fraction, by estimation, is 55 to 60%. The   left ventricle has normal function. The left ventricle has no regional  wall motion abnormalities. There is moderate asymmetric left ventricular  hypertrophy of the septal segment.  Left ventricular diastolic parameters are indeterminate.  2. Right ventricular systolic function is low normal. The right  ventricular size is normal. Tricuspid regurgitation signal is inadequate  for assessing PA pressure.  3. The mitral valve is grossly normal. Trivial mitral valve  regurgitation.  4. The aortic valve is tricuspid. Aortic valve regurgitation is mild.  5. The inferior vena cava is normal in size with greater than 50%  respiratory variability, suggesting right atrial pressure of 3 mmHg.      HTN (hypertension), benign -resume home meds at discharge   Hyperlipidemia -LDL 126, started on Lipitor 40 mg daily goal less than 70    Polycythemia, secondary, thrombocytopenia -Currently no acute issues, outpatient follow-up with hematology    Diabetes mellitus ([place]), type 2, NIDDM, uncontrolled with hyperglycemia -Patient reported that he was on Metformin in the past however did not tolerated well, has been following his diet -Obtain hemoglobin A1c, given possibility of acute CVA, will need better glycemic control. -Placed on carb modified diet, sliding scale insulin.  Will need oral hypoglycemic at the time of discharge. -Pt has refused to take metformin again.  DC on oral tradjenta, glipizide 5 mg BID with meals, monitor blood sugar closely and hypoglycemia precautions reviewed with patient and written information given, ambulatory referral to endocrinology made at discharge.      Acute lower UTI TREATED  -May have exacerbated the weakness, #1, follow urine culture and sensitivities -Received cephalexin in ED, placed on IV Rocephin, Urine culture pending at discharge, will discharge home on oral omnicef x 3 more days to complete treatment, follow up final urine culture with PCP.    GERD Continue PPI  History of left lower extremity DVT Continue Xarelto  Code Status: Full code DVT Prophylaxis: Xarelto Family Communication: Discussed all imaging results, lab results, explained to the patient. Called patient's wife on phone x2, unable to make contact.    Discharge Diagnoses:  Principal Problem:   Ataxia Active Problems:   HTN (hypertension), benign   Polycythemia, secondary   Hyperlipidemia   Diabetes mellitus ([place])   Acute lower UTI  Discharge Instructions: Discharge Instructions    Ambulatory referral to Endocrinology   Complete by: As directed    Ambulatory referral to Physical Therapy   Complete by: As directed      Allergies as of 09/06/2019      Reactions   Codeine Nausea Only   Hydrocodone-acetaminophen Other (See Comments)   Makes patient sick   Vicodin [hydrocodone-acetaminophen] Nausea And Vomiting      Medication List    TAKE these medications   atorvastatin 40 MG tablet Commonly known as: LIPITOR Take 1 tablet (40 mg total) by mouth every evening.   cefdinir 300 MG capsule Commonly known as: OMNICEF Take 2 capsules (600 mg total) by mouth daily for 3 days. Start taking on: September 07, 2019   dextroamphetamine 5 MG tablet Commonly known as: DEXTROSTAT Take 5 mg by mouth 3 (three) times daily.   dorzolamide-timolol 22.3-6.8 MG/ML ophthalmic solution Commonly known  as: COSOPT Place 1 drop into the right eye 2 (two) times daily.   glipiZIDE 5 MG tablet Commonly known as: GLUCOTROL Take 1 tablet (5 mg total) by mouth 2 (two) times daily before a meal.   latanoprost 0.005 % ophthalmic solution Commonly known as: XALATAN Apply 1 drop to eye at bedtime.   linagliptin 5 MG Tabs tablet Commonly known as: TRADJENTA Take 1 tablet (5 mg total) by mouth daily.   lisinopril 40 MG tablet Commonly known as: ZESTRIL Take 40 mg by mouth daily.   omeprazole 20 MG capsule Commonly known as: PRILOSEC Take 20 mg by mouth daily.    sildenafil 100 MG tablet Commonly known as: VIAGRA Take 100 mg by mouth daily as needed for erectile dysfunction.   Testosterone 20.25 MG/ACT (1.62%) Gel APPLY ONE PUMP EACH UPPER ARM ONCE DAILY E29.1   valACYclovir 1000 MG tablet Commonly known as: VALTREX Take 1,000 mg by mouth daily.   Xarelto 20 MG Tabs tablet Generic drug: rivaroxaban Take 20 mg by mouth daily.       Follow-up Information    Velna Hatchet, MD. Schedule an appointment as soon as possible for a visit in 1 week(s).   Specialty: Internal Medicine Contact information: [place] [address] [phone]        Phillips Odor, MD. Schedule an appointment as soon as possible for a visit in 1 month(s).   Specialty: Neurology Why: Hospital Follow Up  Contact information: [address] [place] [address] [phone]              Allergies  Allergen Reactions  . Codeine Nausea Only  . Hydrocodone-Acetaminophen Other (See Comments)    Makes patient sick  . Vicodin [Hydrocodone-Acetaminophen] Nausea And Vomiting   Allergies as of 09/06/2019      Reactions   Codeine Nausea Only   Hydrocodone-acetaminophen Other (See Comments)   Makes patient sick   Vicodin [hydrocodone-acetaminophen] Nausea And Vomiting      Medication List    TAKE these medications   atorvastatin 40 MG tablet Commonly known as: LIPITOR Take 1 tablet (40 mg total) by mouth every evening.   cefdinir 300 MG capsule Commonly known as: OMNICEF Take 2 capsules (600 mg total) by mouth daily for 3 days. Start taking on: September 07, 2019   dextroamphetamine 5 MG tablet Commonly known as: DEXTROSTAT Take 5 mg by mouth 3 (three) times daily.   dorzolamide-timolol 22.3-6.8 MG/ML ophthalmic solution Commonly known as: COSOPT Place 1 drop into the right eye 2 (two) times daily.   glipiZIDE 5 MG tablet Commonly known as: GLUCOTROL Take 1 tablet (5 mg total) by mouth 2 (two) times daily before a meal.    latanoprost 0.005 % ophthalmic solution Commonly known as: XALATAN Apply 1 drop to eye at bedtime.   linagliptin 5 MG Tabs tablet Commonly known as: TRADJENTA Take 1 tablet (5 mg total) by mouth daily.   lisinopril 40 MG tablet Commonly known as: ZESTRIL Take 40 mg by mouth daily.   omeprazole 20 MG capsule Commonly known as: PRILOSEC Take 20 mg by mouth daily.   sildenafil 100 MG tablet Commonly known as: VIAGRA Take 100 mg by mouth daily as needed for erectile dysfunction.   Testosterone 20.25 MG/ACT (1.62%) Gel APPLY ONE PUMP EACH UPPER ARM ONCE DAILY E29.1   valACYclovir 1000 MG tablet Commonly known as: VALTREX Take 1,000 mg by mouth daily.   Xarelto 20 MG Tabs tablet Generic drug: rivaroxaban Take 20  mg by mouth daily.       Procedures/Studies: DG Chest 2 View  Result Date: 09/04/2019 CLINICAL DATA:  Weakness. EXAM: CHEST - 2 VIEW COMPARISON:  August 11, 2014 FINDINGS: The heart size and mediastinal contours are within normal limits. Both lungs are clear. The visualized skeletal structures are unremarkable. IMPRESSION: No active cardiopulmonary disease. Electronically Signed   By: Constance Holster [name]   On: 09/04/2019 18:38   CT Head Wo Contrast  Result Date: 09/04/2019 CLINICAL DATA:  Left-sided weakness. EXAM: CT HEAD WITHOUT CONTRAST TECHNIQUE: Contiguous axial images were obtained from the base of the skull through the vertex without intravenous contrast. COMPARISON:  None. FINDINGS: Brain: Mild chronic ischemic white matter disease is noted. No mass effect or midline shift is noted. Ventricular size is within normal limits. There is no evidence of mass lesion, hemorrhage or acute infarction. Vascular: No hyperdense vessel or unexpected calcification. Skull: Normal. Negative for fracture or focal lesion. Sinuses/Orbits: No acute finding. Other: None. IMPRESSION: Mild chronic ischemic white matter disease. No acute intracranial abnormality seen. Electronically Signed    By: Marijo Conception [name]   On: 09/04/2019 12:29   MR ANGIO HEAD WO CONTRAST  Result Date: 09/05/2019 CLINICAL DATA:  Neuro deficit, acute, stroke suspected. EXAM: MRI HEAD WITHOUT CONTRAST MRA HEAD WITHOUT CONTRAST TECHNIQUE: Multiplanar, multiecho pulse sequences of the brain and surrounding structures were obtained without intravenous contrast. Angiographic images of the head were obtained using MRA technique without contrast. COMPARISON:  Head CT September 04, 2019. FINDINGS: MRI HEAD FINDINGS Brain: Foci of restricted diffusion involving the right lateral geniculate body, tail of the caudate nucleus and posterior aspect of the putamen and internal capsule, consistent with recent infarct. There is no hemorrhage, hydrocephalus, extra-axial collection or mass lesion. Small remote infarcts are seen in the left cerebellar hemisphere, left centrum semiovale and corona radiata and left sub insular region. Punctate focus of hemosiderin deposit in the right cerebellar hemisphere and bilateral frontal lobes. Scattered and confluent foci of T2 hyperintensity are seen within the white matter of the cerebral hemispheres and within the pons, nonspecific, most likely related to chronic small vessel ischemia Vascular: Normal flow voids. Skull and upper cervical spine: Normal marrow signal. Sinuses/Orbits: Mucous retention cyst in the right maxillary sinus. The orbits are maintained. Other: None. MRA HEAD FINDINGS The visualized portions of the distal cervical and intracranial internal carotid arteries are widely patent with normal flow related enhancement. The bilateral anterior cerebral arteries and middle cerebral arteries are widely patent with antegrade flow without high-grade flow-limiting stenosis or proximal branch occlusion. 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 The vertebral arteries are widely patent with antegrade flow. The left vertebral  artery is dominant. The posterior inferior cerebral arteries are normal. Vertebrobasilar junction and basilar artery are widely patent with antegrade flow without evidence of basilar stenosis or aneurysm. The right posterior cerebral artery originates directly from the right ICA (fetal PCA). Bilateral posterior cerebral arteries appear normal. No intracranial aneurysm within the posterior circulation. IMPRESSION: 1. Foci of restricted diffusion involving the right lateral geniculate body, tail of the caudate nucleus and posterior aspect of the putamen and internal capsule, consistent with recent infarct in the right anterior choroidal artery territory. 2. Small remote infarcts in the left cerebellar hemisphere, left centrum semiovale, corona radiata and left sub insular region. 3. Moderate to advanced chronic small vessel ischemia. 4. No intracranial large vessel occlusion or high-grade flow-limiting  stenosis. 5. 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 These results were called by telephone at the time of interpretation on 09/05/2019 at 2:50 pm to Dr. Tana Coast, who verbally acknowledged these results. Results were also communicated to Dr. Merlene Laughter via aging system at 3:10 p.m. on 09/05/2019. Electronically Signed   By: Pedro Earls [name]   On: 09/05/2019 15:10   MR BRAIN WO CONTRAST  Result Date: 09/05/2019 CLINICAL DATA:  Neuro deficit, acute, stroke suspected. EXAM: MRI HEAD WITHOUT CONTRAST MRA HEAD WITHOUT CONTRAST TECHNIQUE: Multiplanar, multiecho pulse sequences of the brain and surrounding structures were obtained without intravenous contrast. Angiographic images of the head were obtained using MRA technique without contrast. COMPARISON:  Head CT September 04, 2019. FINDINGS: MRI HEAD FINDINGS Brain: Foci of restricted diffusion involving the right lateral geniculate body, tail of the caudate nucleus and posterior aspect of the putamen  and internal capsule, consistent with recent infarct. There is no hemorrhage, hydrocephalus, extra-axial collection or mass lesion. Small remote infarcts are seen in the left cerebellar hemisphere, left centrum semiovale and corona radiata and left sub insular region. Punctate focus of hemosiderin deposit in the right cerebellar hemisphere and bilateral frontal lobes. Scattered and confluent foci of T2 hyperintensity are seen within the white matter of the cerebral hemispheres and within the pons, nonspecific, most likely related to chronic small vessel ischemia Vascular: Normal flow voids. Skull and upper cervical spine: Normal marrow signal. Sinuses/Orbits: Mucous retention cyst in the right maxillary sinus. The orbits are maintained. Other: None. MRA HEAD FINDINGS The visualized portions of the distal cervical and intracranial internal carotid arteries are widely patent with normal flow related enhancement. The bilateral anterior cerebral arteries and middle cerebral arteries are widely patent with antegrade flow without high-grade flow-limiting stenosis or proximal branch occlusion. 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 The vertebral arteries are widely patent with antegrade flow. The left vertebral artery is dominant. The posterior inferior cerebral arteries are normal. Vertebrobasilar junction and basilar artery are widely patent with antegrade flow without evidence of basilar stenosis or aneurysm. The right posterior cerebral artery originates directly from the right ICA (fetal PCA). Bilateral posterior cerebral arteries appear normal. No intracranial aneurysm within the posterior circulation. IMPRESSION: 1. Foci of restricted diffusion involving the right lateral geniculate body, tail of the caudate nucleus and posterior aspect of the putamen and internal capsule, consistent with recent infarct in the right anterior choroidal artery  territory. 2. Small remote infarcts in the left cerebellar hemisphere, left centrum semiovale, corona radiata and left sub insular region. 3. Moderate to advanced chronic small vessel ischemia. 4. No intracranial large vessel occlusion or high-grade flow-limiting stenosis. 5. 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 These results were called by telephone at the time of interpretation on 09/05/2019 at 2:50 pm to Dr. Tana Coast, who verbally acknowledged these results. Results were also communicated to Dr. Merlene Laughter via aging system at 3:10 p.m. on 09/05/2019. Electronically Signed   By: Pedro Earls [name]   On: 09/05/2019 15:10   US Carotid Bilateral (at [facility] and [facility] only)  Result Date: 09/05/2019 CLINICAL DATA:  Transient ischemic attack EXAM: BILATERAL CAROTID DUPLEX ULTRASOUND TECHNIQUE: Pearline Cables scale imaging, color Doppler and duplex ultrasound were performed of bilateral carotid and vertebral arteries in the neck. COMPARISON:  None. FINDINGS: Criteria:  Quantification of carotid stenosis is based on velocity parameters that correlate the residual internal carotid diameter with NASCET-based stenosis levels, using the diameter of the distal internal carotid lumen as the denominator for stenosis measurement. The following velocity measurements were obtained: RIGHT ICA: 78/16 cm/sec CCA: 26/37 cm/sec SYSTOLIC ICA/CCA RATIO:  0.9 ECA:  137 cm/sec LEFT ICA: 109/19 cm/sec CCA: 858/85 cm/sec SYSTOLIC ICA/CCA RATIO:  0.8 ECA:  122 cm/sec RIGHT CAROTID ARTERY: No significant atherosclerotic plaque or evidence of stenosis. RIGHT VERTEBRAL ARTERY:  Patent with normal antegrade flow. LEFT CAROTID ARTERY: No significant atherosclerotic plaque or evidence of stenosis. LEFT VERTEBRAL ARTERY:  Patent with normal antegrade flow. IMPRESSION: 1. No significant atherosclerotic plaque or stenosis in either internal carotid artery. 2. The vertebral arteries are patent  with normal antegrade flow. Signed, Criselda Peaches, MD, [facility] Electronically Signed   By: Jacqulynn Cadet [name]   On: 09/05/2019 12:18   ECHOCARDIOGRAM COMPLETE  Result Date: 09/05/2019    ECHOCARDIOGRAM REPORT   Patient Name:   [name] Date of Exam: 09/05/2019 Medical Rec #:  [id number]      Height:       73.0 in Accession #:    [id number]     Weight:       224.6 lb Date of Birth:  02-Mar-1960      BSA:          2.261 m Patient Age:    59 years       BP:           128/67 mmHg Patient Gender: M              HR:           80 bpm. Exam Location:  [facility] Procedure: 2D Echo Indications:    Stroke 434.91 / I163.9  History:        Patient has prior history of Echocardiogram examinations, most                 recent 09/21/2014. Signs/Symptoms:Chest Pain; Risk                 Factors:Diabetes, Hypertension, Dyslipidemia and Non-Smoker.  Sonographer:    Leavy Cella RDCS (AE) Referring Phys: [phone] DAVID TAT IMPRESSIONS  1. Left ventricular ejection fraction, by estimation, is 55 to 60%. The left ventricle has normal function. The left ventricle has no regional wall motion abnormalities. There is moderate asymmetric left ventricular hypertrophy of the septal segment. Left ventricular diastolic parameters are indeterminate.  2. Right ventricular systolic function is low normal. The right ventricular size is normal. Tricuspid regurgitation signal is inadequate for assessing PA pressure.  3. The mitral valve is grossly normal. Trivial mitral valve regurgitation.  4. The aortic valve is tricuspid. Aortic valve regurgitation is mild.  5. The inferior vena cava is normal in size with greater than 50% respiratory variability, suggesting right atrial pressure of 3 mmHg. FINDINGS  Left Ventricle: Left ventricular ejection fraction, by estimation, is 55 to 60%. The left ventricle has normal function. The left ventricle has no regional wall motion  abnormalities. The left ventricular internal cavity size was normal in size. There is  moderate asymmetric left ventricular hypertrophy of the septal segment. Left ventricular diastolic parameters are indeterminate. Right Ventricle: The right ventricular size is normal. No increase in right ventricular wall thickness. Right ventricular systolic function is low normal. Tricuspid regurgitation signal is inadequate for assessing PA pressure. Left  Atrium: Left atrial size was normal in size. Right Atrium: Right atrial size was normal in size. Pericardium: There is no evidence of pericardial effusion. Presence of pericardial fat pad. Mitral Valve: The mitral valve is grossly normal. Trivial mitral valve regurgitation. Tricuspid Valve: The tricuspid valve is grossly normal. Tricuspid valve regurgitation is trivial. Aortic Valve: The aortic valve is tricuspid. Aortic valve regurgitation is mild. Aortic regurgitation PHT measures 545 msec. Mild aortic valve annular calcification. Pulmonic Valve: The pulmonic valve was grossly normal. Pulmonic valve regurgitation is trivial. Aorta: The aortic root is normal in size and structure. Venous: The inferior vena cava is normal in size with greater than 50% respiratory variability, suggesting right atrial pressure of 3 mmHg. IAS/Shunts: No atrial level shunt detected by color flow Doppler.  LEFT VENTRICLE PLAX 2D LVIDd:         4.29 cm  Diastology LVIDs:         3.50 cm  LV e' lateral:   7.18 cm/s LV PW:         1.23 cm  LV E/e' lateral: 8.5 LV IVS:        1.45 cm  LV e' medial:    5.77 cm/s LVOT diam:     2.10 cm  LV E/e' medial:  10.5 LVOT Area:     3.46 cm  RIGHT VENTRICLE RV S prime:     13.30 cm/s TAPSE (M-mode): 1.8 cm LEFT ATRIUM             Index       RIGHT ATRIUM          Index LA diam:        2.90 cm 1.28 cm/m  RA Area:     8.64 cm LA Vol (A2C):   35.2 ml 15.57 ml/m RA Volume:   14.60 ml 6.46 ml/m LA Vol (A4C):   23.4 ml 10.35 ml/m LA Biplane Vol: 30.8 ml 13.62  ml/m  AORTIC VALVE AI PHT:      545 msec  AORTA Ao Root diam: 2.90 cm MITRAL VALVE MV Area (PHT): 3.30 cm    SHUNTS MV Decel Time: 230 msec    Systemic Diam: 2.10 cm MV E velocity: 60.80 cm/s MV A velocity: 51.00 cm/s MV E/A ratio:  1.19 Rozann Lesches MD Electronically signed by Rozann Lesches MD Signature Date/Time: 09/05/2019/5:31:47 PM    Final      Subjective: Pt reports feeling better today, He has been ambulating,  He was working with diabetes coordinator and refusing to take metformin any longer.    Discharge Exam: Vitals:   09/06/19 0504 09/06/19 0905  BP: 132/87 (!) 133/94  Pulse: (!) 58 65  Resp: 18 18  Temp: 98.4 F (36.9 C) 99 F (37.2 C)  SpO2: 95% 96%   Vitals:   09/05/19 2136 09/06/19 0039 09/06/19 0504 09/06/19 0905  BP: 114/80 116/84 132/87 (!) 133/94  Pulse: 70 (!) 54 (!) 58 65  Resp: 20 18 18 18   Temp: 98.4 F (36.9 C) 98.3 F (36.8 C) 98.4 F (36.9 C) 99 F (37.2 C)  TempSrc: Oral Oral  Oral  SpO2: 95% 97% 95% 96%  Weight:      Height:       General: Pt is alert, awake, not in acute distress Cardiovascular: RRR, S1/S2 +, no rubs, no gallops Respiratory: CTA bilaterally, no wheezing, no rhonchi Abdominal: Soft, NT, ND, bowel sounds + Extremities: no edema, no cyanosis   The results of significant diagnostics from this  hospitalization (including imaging, microbiology, ancillary and laboratory) are listed below for reference.    Microbiology: Recent Results (from the past 240 hour(s))  Urine Culture     Status: Abnormal (Preliminary result)   Collection Time: 09/04/19  1:24 PM   Specimen: Urine, Clean Catch  Result Value Ref Range Status   Specimen Description   Final    URINE, CLEAN CATCH Performed at [facility], [address] [place], [place] [address]    Special Requests   Final    NONE Performed at [facility], [address], [place], [place] [address]    Culture (A)  Final    >=100,000 COLONIES/mL GRAM NEGATIVE RODS SUSCEPTIBILITIES  TO FOLLOW Performed at [facility], [place] [address], [place], [place] [address]    Report Status PENDING  Incomplete  SARS Coronavirus 2 by RT PCR (hospital order, performed in [facility] hospital lab) Nasopharyngeal Nasopharyngeal Swab     Status: None   Collection Time: 09/04/19  2:22 PM   Specimen: Nasopharyngeal Swab  Result Value Ref Range Status   SARS Coronavirus 2 NEGATIVE NEGATIVE Final    Comment: (NOTE) SARS-CoV-2 target nucleic acids are NOT DETECTED.  The SARS-CoV-2 RNA is generally detectable in upper and lower respiratory specimens during the acute phase of infection. The lowest concentration of SARS-CoV-2 viral copies this assay can detect is 250 copies / mL. A negative result does not preclude SARS-CoV-2 infection and should not be used as the sole basis for treatment or other patient management decisions.  A negative result may occur with improper specimen collection / handling, submission of specimen other than nasopharyngeal swab, presence of viral mutation(s) within the areas targeted by this assay, and inadequate number of viral copies (<250 copies / mL). A negative result must be combined with clinical observations, patient history, and epidemiological information.  Fact Sheet for Patients:   StrictlyIdeas.no  Fact Sheet for Healthcare Providers: BankingDealers.co.za  This test is not yet approved or  cleared by the [place] FDA and has been authorized for detection and/or diagnosis of SARS-CoV-2 by FDA under an Emergency Use Authorization (EUA).  This EUA will remain in effect (meaning this test can be used) for the duration of the COVID-19 declaration under Section 564(b)(1) of the Act, 21 U.S.C. section 360bbb-3(b)(1), unless the authorization is terminated or revoked sooner.  Performed at [facility], [address] [place],  [address]      Labs: BNP (last 3 results) No results for  input(s): BNP in the last 8760 hours. Basic Metabolic Panel: Recent Labs  Lab 09/04/19 1052 09/05/19 0543  NA 137 137  K 4.0 3.7  CL 102 103  CO2 26 25  GLUCOSE 223* 174*  BUN 14 14  CREATININE 1.06 0.88  CALCIUM 9.4 9.0   Liver Function Tests: No results for input(s): AST, ALT, ALKPHOS, BILITOT, PROT, ALBUMIN in the last 168 hours. No results for input(s): LIPASE, AMYLASE in the last 168 hours. No results for input(s): AMMONIA in the last 168 hours. CBC: Recent Labs  Lab 09/04/19 1052 09/05/19 0543  WBC 6.9 7.5  NEUTROABS 5.2  --   HGB 17.8* 17.1*  HCT 54.3* 53.5*  MCV 95.4 96.4  PLT 144* 138*   Cardiac Enzymes: No results for input(s): CKTOTAL, CKMB, CKMBINDEX, TROPONINI in the last 168 hours. BNP: Invalid input(s): POCBNP CBG: Recent Labs  Lab 09/05/19 1225 09/05/19 1808 09/05/19 2327 09/06/19 0744 09/06/19 1107  GLUCAP 257* 176* 232* 213* 174*  D-Dimer No results for input(s): DDIMER in the last 72 hours. Hgb A1c Recent Labs    09/04/19 1052  HGBA1C 9.6*   Lipid Profile Recent Labs    09/05/19 0543  CHOL 198  HDL 26*  LDLCALC 126*  TRIG 232*  CHOLHDL 7.6   Thyroid function studies Recent Labs    09/04/19 1758  TSH 0.879   Anemia work up Recent Labs    09/04/19 1758  VITAMINB12 401  FOLATE 17.9   Urinalysis    Component Value Date/Time   COLORURINE YELLOW 09/04/2019 1115   APPEARANCEUR HAZY (A) 09/04/2019 1115   LABSPEC 1.020 09/04/2019 1115   PHURINE 5.0 09/04/2019 1115   GLUCOSEU >=500 (A) 09/04/2019 1115   HGBUR SMALL (A) 09/04/2019 1115   BILIRUBINUR NEGATIVE 09/04/2019 1115   KETONESUR NEGATIVE 09/04/2019 1115   PROTEINUR 30 (A) 09/04/2019 1115   NITRITE POSITIVE (A) 09/04/2019 1115   LEUKOCYTESUR SMALL (A) 09/04/2019 1115   Sepsis Labs Invalid input(s): PROCALCITONIN,  WBC,  LACTICIDVEN Microbiology Recent Results (from the past 240 hour(s))  Urine Culture     Status: Abnormal (Preliminary result)   Collection  Time: 09/04/19  1:24 PM   Specimen: Urine, Clean Catch  Result Value Ref Range Status   Specimen Description   Final    URINE, CLEAN CATCH Performed at [facility], [address] [place], [place] [address]    Special Requests   Final    NONE Performed at [facility], [address] [place], [place] [address]    Culture (A)  Final    >=100,000 COLONIES/mL GRAM NEGATIVE RODS SUSCEPTIBILITIES TO FOLLOW Performed at [facility], [place] [address], [place], [place] [address]    Report Status PENDING  Incomplete  SARS Coronavirus 2 by RT PCR (hospital order, performed in [facility] hospital lab) Nasopharyngeal Nasopharyngeal Swab     Status: None   Collection Time: 09/04/19  2:22 PM   Specimen: Nasopharyngeal Swab  Result Value Ref Range Status   SARS Coronavirus 2 NEGATIVE NEGATIVE Final    Comment: (NOTE) SARS-CoV-2 target nucleic acids are NOT DETECTED.  The SARS-CoV-2 RNA is generally detectable in upper and lower respiratory specimens during the acute phase of infection. The lowest concentration of SARS-CoV-2 viral copies this assay can detect is 250 copies / mL. A negative result does not preclude SARS-CoV-2 infection and should not be used as the sole basis for treatment or other patient management decisions.  A negative result may occur with improper specimen collection / handling, submission of specimen other than nasopharyngeal swab, presence of viral mutation(s) within the areas targeted by this assay, and inadequate number of viral copies (<250 copies / mL). A negative result must be combined with clinical observations, patient history, and epidemiological information.  Fact Sheet for Patients:   StrictlyIdeas.no  Fact Sheet for Healthcare Providers: BankingDealers.co.za  This test is not yet approved or  cleared by the [place] FDA and has been authorized for detection and/or diagnosis of SARS-CoV-2 by FDA  under an Emergency Use Authorization (EUA).  This EUA will remain in effect (meaning this test can be used) for the duration of the COVID-19 declaration under Section 564(b)(1) of the Act, 21 U.S.C. section 360bbb-3(b)(1), unless the authorization is terminated or revoked sooner.  Performed at [facility], [address], [place], [place] [address]    Time coordinating discharge:  41 mins   SIGNED:  Irwin Brakeman, MD  [facility] 09/06/2019, 12:47 PM How to contact  the [facility] Attending or Consulting provider [place] or covering provider during after hours [place], for this patient?  1. Check the care team in [facility] and look for a) attending/consulting [facility] provider listed and b) the [facility] team listed 2. Log into [email or web address] and use [place]'s universal password to access. If you do not have the password, please contact the hospital operator. 3. Locate the [facility] provider you are looking for under [facility] and page to a number that you can be directly reached. 4. If you still have difficulty reaching the provider, please page the [facility] (Director on Call) for the Hospitalists listed on amion for assistance.

## 2019-09-06 NOTE — Progress Notes (Signed)
IV removed, 2x2 gauze and paper tape applied to site, patient tolerated well. Reviewed AVS with patient who verbalized understanding. Patient to be taken to front lobby via wheelchair and to be transported home by his mother.

## 2019-09-06 NOTE — Discharge Instructions (Signed)
Local Endocrinologists [facility] [phone]) 1. Dr. Philemon Kingdom 2. Dr. Janie [facility] [phone]) 1. Dr. Delrae Rend [facility] [phone]) 1. Dr. Jacelyn Pi 2. Dr. Anda Kraft [facility] [phone]) 1. Dr. Daneil Dolin [facility] [phone]) [[place] office]  [phone]) [[place] office] 1. Dr. Lenna Sciara Solum 2. Dr. Mee Hives [facility]) [phone]) 1. Autumn Hudnall Ronnald Ramp), PA 2. Dr. Amalia Greenhouse 3. Dr. Marsh Dolly. [facility] [phone]) 1. Dr. Glade Lloyd [facility] [phone]) 1. Dr. Orville Govern 2. Dr. Lelon Huh 3. Dr. Jerelene Redden 4. Alwyn Ren, FNP Dr. Carolynn Serve. [facility] in [place] [phone])  Hyperglycemia Hyperglycemia is when the sugar (glucose) level in your blood is too high. It may not cause symptoms. If you do have symptoms, they may include warning signs, such as:  Feeling more thirsty than normal.  Hunger.  Feeling tired.  Needing to pee (urinate) more than normal.  Blurry eyesight (vision). You may get other symptoms as it gets worse, such as:  Dry mouth.  Not being hungry (loss of appetite).  Fruity-smelling breath.  Weakness.  Weight gain or loss that is not planned. Weight loss may be fast.  A tingling or numb feeling in your hands or feet.  Headache.  Skin that does not bounce back quickly when it is lightly pinched and released (poor skin turgor).  Pain in your belly (abdomen).  Cuts or bruises that heal slowly. High blood sugar can happen to people who do or do not have diabetes. High blood sugar can happen slowly or quickly, and it can be an emergency. Follow these instructions at home: General instructions  Take over-the-counter and prescription medicines only as told by your doctor.  Do  not use products that contain nicotine or tobacco, such as cigarettes and e-cigarettes. If you need help quitting, ask your doctor.  Limit alcohol intake to no more than 1 drink per day for nonpregnant women and 2 drinks per day for men. One drink equals 12 oz of beer, 5 oz of wine, or 1 oz of hard liquor.  Manage stress. If you need help with this, ask your doctor.  Keep all follow-up visits as told by your doctor. This is important. Eating and drinking   Stay at a healthy weight.  Exercise regularly, as told by your doctor.  Drink enough fluid, especially when you: ? Exercise. ? Get sick. ? Are in hot temperatures.  Eat healthy foods, such as: ? Low-fat (lean) proteins. ? Complex carbs (complex carbohydrates), such as whole wheat bread or brown rice. ? Fresh fruits and vegetables. ? Low-fat dairy products. ? Healthy fats.  Drink enough fluid to keep your pee (urine) clear or pale yellow. If you have diabetes:   Make sure you know the symptoms of hyperglycemia.  Follow your diabetes management plan, as told by your doctor. Make sure you: ? Take insulin and medicines as told. ? Follow your exercise plan. ? Follow your meal plan. Eat on time. Do not skip meals. ? Check your blood sugar as often as told. Make sure to check before and after exercise. If you exercise longer or in a different way than you normally do, check your blood sugar more often. ? Follow your sick day plan whenever you cannot eat or drink normally. Make this plan ahead of time with your doctor.  Share your diabetes management plan with people in your workplace, school, and household.  Check your urine for ketones when you are ill and as told by your doctor.  Carry a card or wear jewelry that says that you have diabetes. Contact a doctor if:  Your blood sugar level is higher than 240 mg/dL (13.3 mmol/L) for 2 days in a row.  You have problems keeping your blood sugar in your target range.  High  blood sugar happens often for you. Get help right away if:  You have trouble breathing.  You have a change in how you think, feel, or act (mental status).  You feel sick to your stomach (nauseous), and that feeling does not go away.  You cannot stop throwing up (vomiting). These symptoms may be an emergency. Do not wait to see if the symptoms will go away. Get medical help right away. Call your local emergency services (911 in the [place] Do not drive yourself to the hospital. Summary  Hyperglycemia is when the sugar (glucose) level in your blood is too high.  High blood sugar can happen to people who do or do not have diabetes.  Make sure you drink enough fluids, eat healthy foods, and exercise regularly.  Contact your doctor if you have problems keeping your blood sugar in your target range. This information is not intended to replace advice given to you by your health care provider. Make sure you discuss any questions you have with your health care provider. Document Revised: 10/07/2015 Document Reviewed: 10/07/2015 Elsevier Patient Education  [place].  Blood Glucose Monitoring, Adult Monitoring your blood sugar (glucose) is an important part of managing your diabetes (diabetes mellitus). Blood glucose monitoring involves checking your blood glucose as often as directed and keeping a record (log) of your results over time. Checking your blood glucose regularly and keeping a blood glucose log can:  Help you and your health care provider adjust your diabetes management plan as needed, including your medicines or insulin.  Help you understand how food, exercise, illnesses, and medicines affect your blood glucose.  Let you know what your blood glucose is at any time. You can quickly find out if you have low blood glucose (hypoglycemia) or high blood glucose (hyperglycemia). Your health care provider will set individualized treatment goals for you. Your goals will be based on  your age, other medical conditions you have, and how you respond to diabetes treatment. Generally, the goal of treatment is to maintain the following blood glucose levels:  Before meals (preprandial): 80-130 mg/dL (4.4-7.2 mmol/L).  After meals (postprandial): below 180 mg/dL (10 mmol/L).  A1c level: less than 7%. Supplies needed:  Blood glucose meter.  Test strips for your meter. Each meter has its own strips. You must use the strips that came with your meter.  A needle to prick your finger (lancet). Do not use a lancet more than one time.  A device that holds the lancet (lancing device).  A journal or log book to write down your results. How to check your blood glucose  1. Wash your hands with soap and water. 2. Prick the side of your finger (not the tip) with the lancet. Use a different finger each time. 3. Gently rub the finger until a small drop of blood appears. 4. Follow instructions that come with your meter for inserting the test strip, applying blood to the strip, and using your blood glucose meter. 5. Write down your result and any notes. Some meters allow you to use areas of your body other than your finger (alternative sites)  to test your blood. The most common alternative sites are:  Forearm.  Thigh.  Palm of the hand. If you think you may have hypoglycemia, or if you have a history of not knowing when your blood glucose is getting low (hypoglycemia unawareness), do not use alternative sites. Use your finger instead. Alternative sites may not be as accurate as the fingers, because blood flow is slower in these areas. This means that the result you get may be delayed, and it may be different from the result that you would get from your finger. Follow these instructions at home: Blood glucose log   Every time you check your blood glucose, write down your result. Also write down any notes about things that may be affecting your blood glucose, such as your diet and  exercise for the day. This information can help you and your health care provider: ? Look for patterns in your blood glucose over time. ? Adjust your diabetes management plan as needed.  Check if your meter allows you to download your records to a computer. Most glucose meters store a record of glucose readings in the meter. If you have type 1 diabetes:  Check your blood glucose 2 or more times a day.  Also check your blood glucose: ? Before every insulin injection. ? Before and after exercise. ? Before meals. ? 2 hours after a meal. ? Occasionally between 2:00 a.m. and 3:00 a.m., as directed. ? Before potentially dangerous tasks, like driving or using heavy machinery. ? At bedtime.  You may need to check your blood glucose more often, up to 6-10 times a day, if you: ? Use an insulin pump. ? Need multiple daily injections (MDI). ? Have diabetes that is not well-controlled. ? Are ill. ? Have a history of severe hypoglycemia. ? Have hypoglycemia unawareness. If you have type 2 diabetes:  If you take insulin or other diabetes medicines, check your blood glucose 2 or more times a day.  If you are on intensive insulin therapy, check your blood glucose 4 or more times a day. Occasionally, you may also need to check between 2:00 a.m. and 3:00 a.m., as directed.  Also check your blood glucose: ? Before and after exercise. ? Before potentially dangerous tasks, like driving or using heavy machinery.  You may need to check your blood glucose more often if: ? Your medicine is being adjusted. ? Your diabetes is not well-controlled. ? You are ill. General tips  Always keep your supplies with you.  If you have questions or need help, all blood glucose meters have a 24-hour "hotline" phone number that you can call. You may also contact your health care provider.  After you use a few boxes of test strips, adjust (calibrate) your blood glucose meter by following instructions that came with  your meter. Contact a health care provider if:  Your blood glucose is at or above 240 mg/dL (13.3 mmol/L) for 2 days in a row.  You have been sick or have had a fever for 2 days or longer, and you are not getting better.  You have any of the following problems for more than 6 hours: ? You cannot eat or drink. ? You have nausea or vomiting. ? You have diarrhea. Get help right away if:  Your blood glucose is lower than 54 mg/dL (3 mmol/L).  You become confused or you have trouble thinking clearly.  You have difficulty breathing.  You have moderate or large ketone levels in your urine.  Summary  Monitoring your blood sugar (glucose) is an important part of managing your diabetes (diabetes mellitus).  Blood glucose monitoring involves checking your blood glucose as often as directed and keeping a record (log) of your results over time.  Your health care provider will set individualized treatment goals for you. Your goals will be based on your age, other medical conditions you have, and how you respond to diabetes treatment.  Every time you check your blood glucose, write down your result. Also write down any notes about things that may be affecting your blood glucose, such as your diet and exercise for the day. This information is not intended to replace advice given to you by your health care provider. Make sure you discuss any questions you have with your health care provider. Document Revised: 11/12/2017 Document Reviewed: 07/01/2015 Elsevier Patient Education  [place].  Hemoglobin A1c Test Why am I having this test? You may have the hemoglobin A1c test (HbA1c test) done to:  Evaluate your risk for developing diabetes (diabetes mellitus).  Diagnose diabetes.  Monitor long-term control of blood sugar (glucose) in people who have diabetes and help make treatment decisions. This test may be done with other blood glucose tests, such as fasting blood glucose and oral  glucose tolerance tests. What is being tested? Hemoglobin is a type of protein in the blood that carries oxygen. Glucose attaches to hemoglobin to form glycated hemoglobin. This test checks the amount of glycated hemoglobin in your blood, which is a good indicator of the average amount of glucose in your blood during the past 2-3 months. What kind of sample is taken?  A blood sample is required for this test. It is usually collected by inserting a needle into a blood vessel. Tell a health care provider about:  All medicines you are taking, including vitamins, herbs, eye drops, creams, and over-the-counter medicines.  Any blood disorders you have.  Any surgeries you have had.  Any medical conditions you have.  Whether you are pregnant or may be pregnant. How are the results reported? Your results will be reported as a percentage that indicates how much of your hemoglobin has glucose attached to it (is glycated). Your health care provider will compare your results to normal ranges that were established after testing a large group of people (reference ranges). Reference ranges may vary among labs and hospitals. For this test, common reference ranges are:  Adult or child without diabetes: 4-5.6%.  Adult or child with diabetes and good blood glucose control: less than 7%. What do the results mean? If you have diabetes:  A result of less than 7% is considered normal, meaning that your blood glucose is well controlled.  A result higher than 7% means that your blood glucose is not well controlled, and your treatment plan may need to be adjusted. If you do not have diabetes:  A result within the reference range is considered normal, meaning that you are not at high risk for diabetes.  A result of 5.7-6.4% means that you have a high risk of developing diabetes, and you may have prediabetes. Prediabetes is the condition of having a blood glucose level that is higher than it should be, but not  high enough for you to be diagnosed with diabetes. Having prediabetes puts you at risk for developing type 2 diabetes (type 2 diabetes mellitus). You may have more tests, including a repeat HbA1c test.  Results of 6.5% or higher on two separate HbA1c tests mean that  you have diabetes. You may have more tests to confirm the diagnosis. Abnormally low HbA1c values may be caused by:  Pregnancy.  Severe blood loss.  Receiving donated blood (transfusions).  Low red blood cell count (anemia).  Long-term kidney failure.  Some unusual forms (variants) of hemoglobin. Talk with your health care provider about what your results mean. Questions to ask your health care provider Ask your health care provider, or the department that is doing the test:  When will my results be ready?  How will I get my results?  What are my treatment options?  What other tests do I need?  What are my next steps? Summary  The hemoglobin A1c test (HbA1c test) may be done to evaluate your risk for developing diabetes, to diagnose diabetes, and to monitor long-term control of blood sugar (glucose) in people who have diabetes and help make treatment decisions.  Hemoglobin is a type of protein in the blood that carries oxygen. Glucose attaches to hemoglobin to form glycated hemoglobin. This test checks the amount of glycated hemoglobin in your blood, which is a good indicator of the average amount of glucose in your blood during the past 2-3 months.  Talk with your health care provider about what your results mean. This information is not intended to replace advice given to you by your health care provider. Make sure you discuss any questions you have with your health care provider. Document Revised: 01/01/2017 Document Reviewed: 09/01/2016 Elsevier Patient Education  [place].     IMPORTANT INFORMATION: PAY CLOSE ATTENTION   PHYSICIAN DISCHARGE INSTRUCTIONS  Follow with Primary care provider   Velna Hatchet, MD  and other consultants as instructed by your Hospitalist Physician  [place] IF SYMPTOMS COME BACK, WORSEN OR NEW PROBLEM DEVELOPS   Please note: You were cared for by a hospitalist during your hospital stay. Every effort will be made to forward records to your primary care provider.  You can request that your primary care provider send for your hospital records if they have not received them.  Once you are discharged, your primary care physician will handle any further medical issues. Please note that NO REFILLS for any discharge medications will be authorized once you are discharged, as it is imperative that you return to your primary care physician (or establish a relationship with a primary care physician if you do not have one) for your post hospital discharge needs so that they can reassess your need for medications and monitor your lab values.  Please get a complete blood count and chemistry panel checked by your Primary MD at your next visit, and again as instructed by your Primary MD.  Get Medicines reviewed and adjusted: Please take all your medications with you for your next visit with your Primary MD  Laboratory/radiological data: Please request your Primary MD to go over all hospital tests and procedure/radiological results at the follow up, please ask your primary care provider to get all Hospital records sent to his/her office.  In some cases, they will be blood work, cultures and biopsy results pending at the time of your discharge. Please request that your primary care provider follow up on these results.  If you are diabetic, please bring your blood sugar readings with you to your follow up appointment with primary care.    Please call and make your follow up appointments as soon as possible.    Also Note the following: If you experience worsening  of your admission symptoms, develop shortness of breath, life threatening  emergency, suicidal or homicidal thoughts you must seek medical attention immediately by calling 911 or calling your MD immediately  if symptoms less severe.  You must read complete instructions/literature along with all the possible adverse reactions/side effects for all the Medicines you take and that have been prescribed to you. Take any new Medicines after you have completely understood and accpet all the possible adverse reactions/side effects.   Do not drive when taking Pain medications or sleeping medications (Benzodiazepines)  Do not take more than prescribed Pain, Sleep and Anxiety Medications. It is not advisable to combine anxiety,sleep and pain medications without talking with your primary care practitioner  Special Instructions: If you have smoked or chewed Tobacco  in the last 2 yrs please stop smoking, stop any regular Alcohol  and or any Recreational drug use.  Wear Seat belts while driving.  Do not drive if taking any narcotic, mind altering or controlled substances or recreational drugs or alcohol.

## 2019-09-06 NOTE — TOC Transition Note (Signed)
Transition of Care [facility]) - CM/SW Discharge Note   Patient Details  Name: [name] MRN: [id number] Date of Birth: 17-Dec-1960  [facility] CM/SW Contact:  Natasha Bence, LCSW Phone Number: 09/06/2019, 1:47 PM   Clinical Narrative:    CSW received a referral for OPPT. CSW contacted patient to inquire about preferred OPPT facilities. Patient agreeable to referral to [facility].CSW placed referral to [facility]. TOC signing off.         Patient Goals and CMS Choice        Discharge Placement                       Discharge Plan and Services                                     Social Determinants of Health (SDOH) Interventions     Readmission Risk Interventions No flowsheet data found.

## 2019-09-06 NOTE — Progress Notes (Addendum)
Inpatient Diabetes Program Recommendations  AACE/ADA: New Consensus Statement on Inpatient Glycemic Control (2015)  Target Ranges:  Prepandial:   less than 140 mg/dL      Peak postprandial:   less than 180 mg/dL (1-2 hours)      Critically ill patients:  140 - 180 mg/dL   Lab Results  Component Value Date   GLUCAP 213 (H) 09/06/2019   HGBA1C 9.6 (H) 09/04/2019    Review of Glycemic Control Results for [name], [name] (MRN [id number]) as of 09/06/2019 10:26  Ref. Range 09/05/2019 18:08 09/05/2019 23:27 09/06/2019 07:44  Glucose-Capillary Latest Ref Range: 70 - 99 mg/dL 176 (H) 232 (H) 213 (H)   Diabetes history: Type 2 DM Outpatient Diabetes medications: none, diet Current orders for Inpatient glycemic control: Novolog 0-9 units TID, Novolog 0-5 units QHS  Inpatient Diabetes Program Recommendations:    Consider adding Levemir 10 units QD.  Will go ahead and place consult for dietitian and [facility].  Assuming patient will need a meter. Blood glucose meter (includes lancets and strips) (#[id number]) Attempted to reach out to patient, no answer. 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   Addendum: Spoke with patient regarding outpatient diabetes management. Patient has been working towards "watching carbohydrates" and has taken Metformin in the past.  Reviewed patient's current A1c of 9.8%. Explained what a A1c is and what it measures. Also reviewed goal A1c with patient, importance of good glucose control @ home, and blood sugar goals. Reviewed patho of DM, role of pancreas, side effects of Metformin, survival skills, interventions, current inpatient glucose trends, impact of CVA risk, vascular changes and commorbidities.  Patient will need glucose meter. Reviewed recommendations of frequency for CBG checks and when to call MD.  Has been working to eliminate and choose foods that are lower in carbohydrates. Drinking predominately water or cabonated waters. Reviewed plate method,  encouraged to continue with mindfulness and made suggestions for incorporation more protein into snacks.  Patient plans to follow up with PCP in next weeks. Will also attach endocrinology list to discharge summary. In preparation for discharge, would recommend Glipizide 5 mg BID and Tradjenta 5 mg QD. Will refuse to take Metformin at all costs due to side effects. Patient has no further questions at this time.    Thanks, Bronson Curb, MSN, RNC-OB Diabetes Coordinator [phone] ([phone])

## 2019-09-07 LAB — URINE CULTURE: Culture: >=100000 — AB

## 2019-10-18 ENCOUNTER — Ambulatory Visit: Payer: Self-pay | Admitting: "Endocrinology

## 2020-08-09 ENCOUNTER — Encounter: Payer: Self-pay | Admitting: Neurology

## 2020-08-09 ENCOUNTER — Ambulatory Visit: Payer: 59 | Admitting: Neurology

## 2020-08-09 VITALS — BP 116/80 | HR 80 | Ht 73.0 in | Wt 218.4 lb

## 2020-08-09 DIAGNOSIS — I671 Cerebral aneurysm, nonruptured: Secondary | ICD-10-CM | POA: Diagnosis not present

## 2020-08-09 DIAGNOSIS — I63231 Cerebral infarction due to unspecified occlusion or stenosis of right carotid arteries: Secondary | ICD-10-CM | POA: Diagnosis not present

## 2020-08-09 NOTE — Progress Notes (Signed)
Reason for visit: Left superior quadrantanopsia  Referring physician: Dr. [place]  [name] is a 60 y.o. adult  History of present illness:  [name] is a 60 year old right-handed white male with a history of diabetes, dyslipidemia, hypertension, gastroesophageal reflux disease, and history of Burkitt's lymphoma.  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  He was admitted to the hospital on 04 September 2019 with onset of gait instability, some dysmetria of the left leg, and a left upper quadrant visual field deficit.  The patient underwent MRI of the brain that confirmed a stroke affecting the right lateral geniculate, right internal capsule, and deep white matter of the centrum semiovale consistent with an anterior cortical artery infarct distribution.  The patient has had a residual mild gait instability from this.  The patient himself does not recall that he complained of visual changes around the time of the stroke but this is documented in the discharge summary.  The patient apparently has been seen by an ophthalmologist in the [place] area on 2 occasions and had visual field testing that he claims was unremarkable.  The patient went to Dr. [place] and had a visual field test again, this picked up a left superior quadrantanopsia consistent with his deficits reported around the time of the stroke.  The patient is being treated for glaucoma.  The patient does have some residual gait instability that usually is worse in the morning, better as the day goes on.  He has not had any falls.  He denies any headaches currently, but he was having some headaches several months ago.  He reports no new numbness or weakness on the face, arms, legs.  Subjectively, he feels that his vision is better now than what it was a year ago.  He denies any problems with speech or swallowing.  He is sent to this office for further evaluation.  The patient reports some  mild short-term memory deficits since the stroke.  Past Medical History:  Diagnosis Date   Abnormal liver function test 08/31/2011   Mild elevation bilirubin, normal/high hemoglobin, normal LDH   Adult ADHD    Aneurysm ([place]) 10/23/2014   74mm left anterior communicating artery region, noted on MRI head   Anxiety    Burkitt's lymphoma ([place])    followed by Dr. Julieanne Manson   Cerebellar infarct [facility]) 10/23/2014   Chronic, noted on MRI head   Chest pain    Depression    DVT (deep venous thrombosis) ([place]) 12/2015   Left leg   Elevated PSA    Glaucoma    Headache    History of double vision 2016   HTN (hypertension), benign 08/31/2011   Hyperlipidemia    Hypotestosteronemia    Polycythemia, secondary 08/31/2011   Likely due to testosterone injections   White matter disease 10/23/2014   noted on MRI head, "extensive"    Past Surgical History:  Procedure Laterality Date   CEREBRAL ANGIOGRAM  11/23/2014   HERNIA REPAIR     KNEE SURGERY Left    SHOULDER SURGERY      Family History  Problem Relation Age of Onset   Stroke Father        Deceased   Hypertension Mother        Living    Social history:  reports that she has never smoked. She has never used smokeless tobacco. She reports current alcohol use. She reports that she does  not use drugs.  Medications:  Prior to Admission medications   Medication Sig Start Date End Date Taking? Authorizing Provider  atorvastatin (LIPITOR) 40 MG tablet Take 1 tablet (40 mg total) by mouth every evening. 09/06/19   Johnson, Clanford L, MD  dextroamphetamine (DEXTROSTAT) 5 MG tablet Take 5 mg by mouth 3 (three) times daily.    [provider]  dorzolamide-timolol (COSOPT) 22.3-6.8 MG/ML ophthalmic solution Place 1 drop into the right eye 2 (two) times daily. 08/13/19   [provider]  glipiZIDE (GLUCOTROL) 5 MG tablet Take 1 tablet (5 mg total) by mouth 2 (two) times daily before a meal. 09/06/19 10/06/19  Johnson, Clanford L, MD   latanoprost (XALATAN) 0.005 % ophthalmic solution Apply 1 drop to eye at bedtime.    [provider]  linagliptin (TRADJENTA) 5 MG TABS tablet Take 1 tablet (5 mg total) by mouth daily. 09/06/19   Johnson, Clanford L, MD  lisinopril (PRINIVIL,ZESTRIL) 40 MG tablet Take 40 mg by mouth daily.  08/07/14   [provider]  omeprazole (PRILOSEC) 20 MG capsule Take 20 mg by mouth daily.    [provider]  sildenafil (VIAGRA) 100 MG tablet Take 100 mg by mouth daily as needed for erectile dysfunction.     [provider]  Testosterone 20.25 MG/ACT (1.62%) GEL APPLY ONE PUMP EACH UPPER ARM ONCE DAILY E29.1 10/23/18   [provider]  valACYclovir (VALTREX) 1000 MG tablet Take 1,000 mg by mouth daily.    [provider]  XARELTO 20 MG TABS tablet Take 20 mg by mouth daily.  08/22/16   [provider]      Allergies  Allergen Reactions   Codeine Nausea Only   Hydrocodone-Acetaminophen Other (See Comments)    Makes patient sick   Vicodin [Hydrocodone-Acetaminophen] Nausea And Vomiting    ROS:  Out of a complete 14 system review of symptoms, the patient complains only of the following symptoms, and all other reviewed systems are negative.  Visual field changes Walking difficulty Memory disturbance  Blood pressure 116/80, pulse 80, height 6\' 1"  (1.854 m), weight 218 lb 6.4 oz (99.1 kg).  Physical Exam  General: The patient is alert and cooperative at the time of the examination.  Eyes: Pupils are equal, round, and reactive to light. Discs are flat bilaterally.  Neck: The neck is supple, no carotid bruits are noted.  Respiratory: The respiratory examination is clear.  Cardiovascular: The cardiovascular examination reveals a regular rate and rhythm, no obvious murmurs or rubs are noted.  Skin: Extremities are without significant edema.  Neurologic Exam  Mental status: The patient is alert and oriented x 3 at the time of the  examination. The patient has apparent normal recent and remote memory, with an apparently normal attention span and concentration ability.  Cranial nerves: Facial symmetry is present. There is good sensation of the face to pinprick and soft touch bilaterally. The strength of the facial muscles and the muscles to head turning and shoulder shrug are normal bilaterally. Speech is well enunciated, no aphasia or dysarthria is noted. Extraocular movements are full. Visual fields are full. The tongue is midline, and the patient has symmetric elevation of the soft palate. No obvious hearing deficits are noted.  Motor: The motor testing reveals 5 over 5 strength of all 4 extremities. Good symmetric motor tone is noted throughout.  Sensory: Sensory testing is intact to pinprick, soft touch, vibration sensation, and position sense on all 4 extremities. No evidence of  extinction is noted.  Coordination: Cerebellar testing reveals good finger-nose-finger and heel-to-shin bilaterally, with exception of some mild dysmetria of the left lower extremity to heel-to-shin.  Gait and station: Gait is slightly wide-based, there is some evidence of a mild circumduction gait with the left leg.  Tandem gait is slightly unsteady.  Romberg is negative.  Reflexes: Deep tendon reflexes are symmetric and normal bilaterally. Toes are downgoing bilaterally.   MRI brain/ MRA head 09/05/19:  IMPRESSION: 1. Foci of restricted diffusion involving the right lateral geniculate body, tail of the caudate nucleus and posterior aspect of the putamen and internal capsule, consistent with recent infarct in the right anterior choroidal artery territory. 2. Small remote infarcts in the left cerebellar hemisphere, left centrum semiovale, corona radiata and left sub insular region. 3. Moderate to advanced chronic small vessel ischemia. 4. No intracranial large vessel occlusion or high-grade flow-limiting stenosis. 5. 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  * MRI scan images were reviewed online. I agree with the written report.   Carotid doppler 09/05/19:  IMPRESSION: 1. No significant atherosclerotic plaque or stenosis in either internal carotid artery. 2. The vertebral arteries are patent with normal antegrade flow.    Assessment/Plan:  1.  History of cerebrovascular disease, recent right brain stroke  2.  Mild left superior quadrantanopsia, likely related to stroke event from August 2021  3.  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  The third visual field test was done by different physician.  I suspect that the visual field deficit was present since the stroke of August 2021, but given the discrepancies in the visual field testing, I will repeat the MRI studies.  We will get MRI of the brain and do MRA of the head to check on the cerebral aneurysm again.  If no differences are seen on the above studies, no further evaluation will be required.  The patient does have at least a moderate level of chronic small vessel disease as well, would consider addition of low-dose aspirin, 81 mg 3 times a week along with the Xarelto.  Jill Alexanders MD 08/09/2020 8:12 AM  [facility] [address] [place], [place] [address]  Phone [phone] Fax [phone]

## 2020-08-23 ENCOUNTER — Other Ambulatory Visit: Payer: 59

## 2021-01-03 ENCOUNTER — Other Ambulatory Visit: Payer: Self-pay | Admitting: Surgery

## 2021-02-05 ENCOUNTER — Encounter (HOSPITAL_COMMUNITY): Payer: Self-pay

## 2021-02-05 NOTE — Progress Notes (Signed)
Surgical Instructions    Your procedure is scheduled on 02/11/21.  Report to [facility] Main Entrance "A" at 5:30 A.M., then check in with the Admitting office.  Call this number if you have problems the morning of surgery:  [phone]   If you have any questions prior to your surgery date call [phone]: Open Monday-Friday 8am-4pm    Remember:  Do not eat after midnight the night before your surgery  You may drink clear liquids until 4:30 the morning of your surgery.   Clear liquids allowed are: Water, Non-Citrus Juices (without pulp), Carbonated Beverages, Clear Tea, Black Coffee ONLY (NO MILK, CREAM OR POWDERED CREAMER of any kind), and Gatorade  Please complete your PRE-SURGERY Gatorade that was provided to you by 4:30the morning of surgery.  Please, if able, drink it in one setting. DO NOT SIP.     Take these medicines the morning of surgery with A SIP OF WATER:  dorzolamide-timolol (COSOPT)  omeprazole (PRILOSEC) Testosterone  valACYclovir (VALTREX)  Follow your surgeon's instructions on when to stop Xarelto.  If no instructions were given by your surgeon then you will need to call the office to get those instructions.      As of today, STOP taking any Aspirin (unless otherwise instructed by your surgeon) Aleve, Naproxen, Ibuprofen, Motrin, Advil, Goody's, BC's, all herbal medications, fish oil, and all vitamins.  WHAT DO I DO ABOUT MY DIABETES MEDICATION?   Do not take oral diabetes medicines (pills) the morning of surgery. DO NOT take Tradjenta day of Surgery.   The day of surgery, do not take other diabetes injectables, including Ozempic, Byetta (exenatide), Bydureon (exenatide ER), Victoza (liraglutide), or Trulicity (dulaglutide).    HOW TO MANAGE YOUR DIABETES BEFORE AND AFTER SURGERY  Why is it important to control my blood sugar before and after surgery? Improving blood sugar levels before and after surgery helps healing and can limit problems. A way of  improving blood sugar control is eating a healthy diet by:  Eating less sugar and carbohydrates  Increasing activity/exercise  Talking with your doctor about reaching your blood sugar goals High blood sugars (greater than 180 mg/dL) can raise your risk of infections and slow your recovery, so you will need to focus on controlling your diabetes during the weeks before surgery. Make sure that the doctor who takes care of your diabetes knows about your planned surgery including the date and location.  How do I manage my blood sugar before surgery? Check your blood sugar at least 4 times a day, starting 2 days before surgery, to make sure that the level is not too high or low.  Check your blood sugar the morning of your surgery when you wake up and every 2 hours until you get to the Short Stay unit.  If your blood sugar is less than 70 mg/dL, you will need to treat for low blood sugar: Do not take insulin. Treat a low blood sugar (less than 70 mg/dL) with  cup of clear juice (cranberry or apple), 4 glucose tablets, OR glucose gel. Recheck blood sugar in 15 minutes after treatment (to make sure it is greater than 70 mg/dL). If your blood sugar is not greater than 70 mg/dL on recheck, call [phone] for further instructions. Report your blood sugar to the short stay nurse when you get to Short Stay.  If you are admitted to the hospital after surgery: Your blood sugar will be checked by the staff and you will probably be given insulin  after surgery (instead of oral diabetes medicines) to make sure you have good blood sugar levels. The goal for blood sugar control after surgery is 80-180 mg/dL.      After your COVID test   You are not required to quarantine however you are required to wear a well-fitting mask when you are out and around people not in your household.  If your mask becomes wet or soiled, replace with a new one.  Wash your hands often with soap and water for 20 seconds or clean  your hands with an alcohol-based hand sanitizer that contains at least 60% alcohol.  Do not share personal items.  Notify your provider: if you are in close contact with someone who has COVID  or if you develop a fever of 100.4 or greater, sneezing, cough, sore throat, shortness of breath or body aches.             Do not wear jewelry or makeup Do not wear lotions, powders, perfumes/colognes, or deodorant. Do not shave 48 hours prior to surgery.  Men may shave face and neck. Do not bring valuables to the hospital. DO Not wear nail polish, gel polish, artificial nails, or any other type of covering on natural nails including finger and toenails. If patients have artificial nails, gel coating, etc. that need to be removed by a nail salon, please have this removed prior to surgery or surgery may need to be canceled/delayed if the surgeon/ anesthesia feels like the patient is unable to be adequately monitored.             [facility] is not responsible for any belongings or valuables.  Do NOT Smoke (Tobacco/Vaping)  24 hours prior to your procedure  If you use a CPAP at night, you may bring your mask for your overnight stay.   Contacts, glasses, hearing aids, dentures or partials may not be worn into surgery, please bring cases for these belongings   For patients admitted to the hospital, discharge time will be determined by your treatment team.   Patients discharged the day of surgery will not be allowed to drive home, and someone needs to stay with them for 24 hours.  NO VISITORS WILL BE ALLOWED IN PRE-OP WHERE PATIENTS ARE PREPPED FOR SURGERY.  ONLY 1 SUPPORT PERSON MAY BE PRESENT IN THE WAITING ROOM WHILE YOU ARE IN SURGERY.  IF YOU ARE TO BE ADMITTED, ONCE YOU ARE IN YOUR ROOM YOU WILL BE ALLOWED TWO (2) VISITORS. 1 (ONE) VISITOR MAY STAY OVERNIGHT BUT MUST ARRIVE TO THE ROOM BY 8pm.  Minor children may have two parents present. Special consideration for safety and communication needs  will be reviewed on a case by case basis.  Special instructions:    Oral Hygiene is also important to reduce your risk of infection.  Remember - BRUSH YOUR TEETH THE MORNING OF SURGERY WITH YOUR REGULAR TOOTHPASTE   [facility]- Preparing For Surgery  Before surgery, you can play an important role. Because skin is not sterile, your skin needs to be as free of germs as possible. You can reduce the number of germs on your skin by washing with CHG (chlorahexidine gluconate) Soap before surgery.  CHG is an antiseptic cleaner which kills germs and bonds with the skin to continue killing germs even after washing.     Please do not use if you have an allergy to CHG or antibacterial soaps. If your skin becomes reddened/irritated stop using the CHG.  Do not shave (  including legs and underarms) for at least 48 hours prior to first CHG shower. It is OK to shave your face.  Please follow these instructions carefully.     Shower the NIGHT BEFORE SURGERY and the MORNING OF SURGERY with CHG Soap.   If you chose to wash your hair, wash your hair first as usual with your normal shampoo. After you shampoo, rinse your hair and body thoroughly to remove the shampoo.  Then [employer] and genitals (private parts) with your normal soap and rinse thoroughly to remove soap.  After that Use CHG Soap as you would any other liquid soap. You can apply CHG directly to the skin and wash gently with a scrungie or a clean washcloth.   Apply the CHG Soap to your body ONLY FROM THE NECK DOWN.  Do not use on open wounds or open sores. Avoid contact with your eyes, ears, mouth and genitals (private parts). Wash Face and genitals (private parts)  with your normal soap.   Wash thoroughly, paying special attention to the area where your surgery will be performed.  Thoroughly rinse your body with warm water from the neck down.  DO NOT shower/wash with your normal soap after using and rinsing off the CHG Soap.  Pat yourself dry  with a CLEAN TOWEL.  Wear CLEAN PAJAMAS to bed the night before surgery  Place CLEAN SHEETS on your bed the night before your surgery  DO NOT SLEEP WITH PETS.   Day of Surgery:  Take a shower with CHG soap. Wear Clean/Comfortable clothing the morning of surgery Do not apply any deodorants/lotions.   Remember to brush your teeth WITH YOUR REGULAR TOOTHPASTE.   Please read over the following fact sheets that you were given.

## 2021-02-06 ENCOUNTER — Inpatient Hospital Stay (HOSPITAL_COMMUNITY)
Admission: RE | Admit: 2021-02-06 | Discharge: 2021-02-06 | Disposition: A | Discharge status: Home / Self Care | Payer: 59 | Source: Ambulatory Visit

## 2021-02-06 HISTORY — DX: Cerebral infarction, unspecified: I63.9

## 2021-02-11 ENCOUNTER — Encounter: Admission: RE | Payer: Self-pay | Source: Home / Self Care

## 2021-02-11 ENCOUNTER — Ambulatory Visit: Admission: RE | Admit: 2021-02-11 | Payer: 59 | Source: Home / Self Care | Admitting: Surgery

## 2021-02-11 SURGERY — REPAIR, HERNIA, UMBILICAL, ADULT
Anesthesia: General

## 2022-01-16 ENCOUNTER — Ambulatory Visit: Payer: Self-pay | Admitting: General Surgery

## 2022-01-16 NOTE — H&P (Signed)
Chief Complaint: New Consultation (Umb hernia)       History of Present Illness: [name] is a 61 y.o. male who is seen today as an office consultation at the request of Dr. Blanch Media for evaluation of New Consultation (Umb hernia) .   Patient is a 61 year old male, with history of blood clots, on Xarelto, who comes in secondary to umbilical hernia.  Patient also with a history of lymphoma.  This is in remission.   Patient has a history of umbilical hernia he states for the last 5 years.  States is gotten larger.  He states he had no significant signs of incarceration or strangulation.   He does state that he has some pain discomfort to the area.   He had no previous abdominal surgery.  He has had a previous inguinal hernia repair with mesh.       Review of Systems: A complete review of systems was obtained from the patient.  I have reviewed this information and discussed as appropriate with the patient.  See HPI as well for other ROS.   Review of Systems  Constitutional:  Negative for fever.  HENT:  Negative for congestion.   Eyes:  Negative for blurred vision.  Respiratory:  Negative for cough, shortness of breath and wheezing.   Cardiovascular:  Negative for chest pain and palpitations.  Gastrointestinal:  Negative for heartburn.  Genitourinary:  Negative for dysuria.  Musculoskeletal:  Negative for myalgias.  Skin:  Negative for rash.  Neurological:  Negative for dizziness and headaches.  Psychiatric/Behavioral:  Negative for depression and suicidal ideas.   All other systems reviewed and are negative.       Medical History: Past Medical History Past Medical History: Diagnosis Date  Aneurysm (CMS-HCC)    Arthritis    Diabetes mellitus without complication (CMS-HCC)    DVT (deep venous thrombosis) (CMS-HCC)    GERD (gastroesophageal reflux disease)    Glaucoma (increased eye pressure)    History of cancer    History of stroke    Hypertension        There  is no problem list on file for this patient.     Past Surgical History History reviewed. No pertinent surgical history.     Allergies Allergies Allergen Reactions  Hydrocodone-Acetaminophen Unknown, Nausea And Vomiting and Other (See Comments)     Makes patient sick      Current Outpatient Medications on File Prior to Visit Medication Sig Dispense Refill  acetaminophen (TYLENOL) 650 MG ER tablet Take 650 mg by mouth every 8 (eight) hours as needed for Pain      dextroamphetamine-amphetamine (ADDERALL) 5 mg tablet TAKE 1 TABLET BY MOUTH THREE TIMES A DAY FOR 30 DAYS      glipiZIDE (GLUCOTROL) 5 MG tablet        lisinopriL (ZESTRIL) 40 MG tablet        omeprazole (PRILOSEC) 20 MG DR capsule Take 20 mg by mouth once daily      tamsulosin (FLOMAX) 0.4 mg capsule        TRADJENTA 5 mg tablet        valACYclovir (VALTREX) 1000 MG tablet Take 1 tablet (1,000 mg total) by mouth once daily      XARELTO 20 mg tablet         No current facility-administered medications on file prior to visit.     Family History Family History Problem Relation Age of Onset  Skin cancer Father    High blood pressure (Hypertension)  Father    Hyperlipidemia (Elevated cholesterol) Father    Diabetes Father        Social History   Tobacco Use Smoking Status Never Smokeless Tobacco Never     Social History Social History    Socioeconomic History  Marital status: Married Tobacco Use  Smoking status: Never  Smokeless tobacco: Never Substance and Sexual Activity  Alcohol use: Yes  Drug use: Never      Objective:     Vitals:   01/16/22 0906 01/16/22 0909 BP: (!) 140/82   Pulse: 83   Temp: 37 C (98.6 F)   Weight: (!) 104.8 kg (231 lb)   Height: 185.4 cm ('6\' 1"'$ )   PainSc:   0-No pain   Body mass index is 30.48 kg/m.   Physical Exam Constitutional:      General: He is not in acute distress.    Appearance: Normal appearance.  HENT:     Head: Normocephalic.     Nose: No  rhinorrhea.     Mouth/Throat:     Mouth: Mucous membranes are moist.     Pharynx: Oropharynx is clear.  Eyes:     General: No scleral icterus.    Pupils: Pupils are equal, round, and reactive to light.  Cardiovascular:     Rate and Rhythm: Normal rate.     Pulses: Normal pulses.  Pulmonary:     Effort: Pulmonary effort is normal. No respiratory distress.     Breath sounds: No stridor. No wheezing.  Abdominal:     General: Abdomen is flat. There is no distension.     Tenderness: There is no abdominal tenderness. There is no guarding or rebound.     Hernia: A hernia is present. Hernia is present in the umbilical area.  Musculoskeletal:        General: Normal range of motion.     Cervical back: Normal range of motion and neck supple.  Skin:    General: Skin is warm and dry.     Capillary Refill: Capillary refill takes less than 2 seconds.     Coloration: Skin is not jaundiced.  Neurological:     General: No focal deficit present.     Mental Status: He is alert and oriented to person, place, and time. Mental status is at baseline.  Psychiatric:        Mood and Affect: Mood normal.        Thought Content: Thought content normal.        Judgment: Judgment normal.        Hernia Size:3cm Incarcerated: yes Initial Hernia     Assessment and Plan: Diagnoses and all orders for this visit:   Umbilical hernia without obstruction or gangrene     [name] is a 61 y.o. male    1.  We will proceed to the OR for a lap ventral hernia repair with mesh. 2. All risks and benefits were discussed with the patient, to generally include infection, bleeding, damage to surrounding structures, acute and chronic nerve pain, and recurrence. Alternatives were offered and described.  All questions were answered and the patient voiced understanding of the procedure and wishes to proceed at this point.             No follow-ups on file.   Ralene Ok, MD, [facility] Surgery,  [facility]

## 2023-04-07 ENCOUNTER — Encounter (HOSPITAL_BASED_OUTPATIENT_CLINIC_OR_DEPARTMENT_OTHER): Payer: Self-pay | Admitting: Emergency Medicine

## 2023-04-07 ENCOUNTER — Emergency Department (HOSPITAL_BASED_OUTPATIENT_CLINIC_OR_DEPARTMENT_OTHER)

## 2023-04-07 ENCOUNTER — Other Ambulatory Visit: Payer: Self-pay

## 2023-04-07 ENCOUNTER — Inpatient Hospital Stay (HOSPITAL_BASED_OUTPATIENT_CLINIC_OR_DEPARTMENT_OTHER)
Admission: EM | Admit: 2023-04-07 | Discharge: 2023-04-09 | DRG: 065 | Disposition: A | Discharge status: Home Health | Attending: Neurology | Admitting: Neurology

## 2023-04-07 DIAGNOSIS — Z794 Long term (current) use of insulin: Secondary | ICD-10-CM

## 2023-04-07 DIAGNOSIS — Z888 Allergy status to other drugs, medicaments and biological substances status: Secondary | ICD-10-CM

## 2023-04-07 DIAGNOSIS — I651 Occlusion and stenosis of basilar artery: Secondary | ICD-10-CM | POA: Diagnosis not present

## 2023-04-07 DIAGNOSIS — I6622 Occlusion and stenosis of left posterior cerebral artery: Secondary | ICD-10-CM | POA: Diagnosis present

## 2023-04-07 DIAGNOSIS — I6522 Occlusion and stenosis of left carotid artery: Secondary | ICD-10-CM | POA: Diagnosis present

## 2023-04-07 DIAGNOSIS — E11649 Type 2 diabetes mellitus with hypoglycemia without coma: Secondary | ICD-10-CM | POA: Diagnosis present

## 2023-04-07 DIAGNOSIS — I6381 Other cerebral infarction due to occlusion or stenosis of small artery: Principal | ICD-10-CM | POA: Diagnosis present

## 2023-04-07 DIAGNOSIS — K219 Gastro-esophageal reflux disease without esophagitis: Secondary | ICD-10-CM | POA: Diagnosis present

## 2023-04-07 DIAGNOSIS — I639 Cerebral infarction, unspecified: Principal | ICD-10-CM

## 2023-04-07 DIAGNOSIS — C859A Non-Hodgkin lymphoma, unspecified, in remission: Secondary | ICD-10-CM | POA: Diagnosis present

## 2023-04-07 DIAGNOSIS — Z7982 Long term (current) use of aspirin: Secondary | ICD-10-CM | POA: Diagnosis not present

## 2023-04-07 DIAGNOSIS — R2689 Other abnormalities of gait and mobility: Secondary | ICD-10-CM | POA: Diagnosis present

## 2023-04-07 DIAGNOSIS — I671 Cerebral aneurysm, nonruptured: Secondary | ICD-10-CM | POA: Diagnosis present

## 2023-04-07 DIAGNOSIS — Z86718 Personal history of other venous thrombosis and embolism: Secondary | ICD-10-CM | POA: Diagnosis not present

## 2023-04-07 DIAGNOSIS — R4701 Aphasia: Secondary | ICD-10-CM | POA: Diagnosis present

## 2023-04-07 DIAGNOSIS — Z79899 Other long term (current) drug therapy: Secondary | ICD-10-CM

## 2023-04-07 DIAGNOSIS — R471 Dysarthria and anarthria: Secondary | ICD-10-CM | POA: Diagnosis present

## 2023-04-07 DIAGNOSIS — E1165 Type 2 diabetes mellitus with hyperglycemia: Secondary | ICD-10-CM | POA: Diagnosis present

## 2023-04-07 DIAGNOSIS — R29702 NIHSS score 2: Secondary | ICD-10-CM | POA: Diagnosis present

## 2023-04-07 DIAGNOSIS — I10 Essential (primary) hypertension: Secondary | ICD-10-CM | POA: Diagnosis present

## 2023-04-07 DIAGNOSIS — I672 Cerebral atherosclerosis: Secondary | ICD-10-CM | POA: Diagnosis present

## 2023-04-07 DIAGNOSIS — E785 Hyperlipidemia, unspecified: Secondary | ICD-10-CM | POA: Diagnosis present

## 2023-04-07 DIAGNOSIS — Z7901 Long term (current) use of anticoagulants: Secondary | ICD-10-CM

## 2023-04-07 DIAGNOSIS — I69398 Other sequelae of cerebral infarction: Secondary | ICD-10-CM | POA: Diagnosis not present

## 2023-04-07 DIAGNOSIS — Z8249 Family history of ischemic heart disease and other diseases of the circulatory system: Secondary | ICD-10-CM

## 2023-04-07 DIAGNOSIS — Z823 Family history of stroke: Secondary | ICD-10-CM | POA: Diagnosis not present

## 2023-04-07 DIAGNOSIS — Z7984 Long term (current) use of oral hypoglycemic drugs: Secondary | ICD-10-CM | POA: Diagnosis not present

## 2023-04-07 DIAGNOSIS — Z885 Allergy status to narcotic agent status: Secondary | ICD-10-CM

## 2023-04-07 DIAGNOSIS — I6389 Other cerebral infarction: Secondary | ICD-10-CM | POA: Diagnosis not present

## 2023-04-07 DIAGNOSIS — E1169 Type 2 diabetes mellitus with other specified complication: Secondary | ICD-10-CM | POA: Diagnosis not present

## 2023-04-07 DIAGNOSIS — Z886 Allergy status to analgesic agent status: Secondary | ICD-10-CM

## 2023-04-07 LAB — CBC
HCT: 55.6 % — ABNORMAL HIGH (ref 39.0–52.0)
Hemoglobin: 18.7 g/dL — ABNORMAL HIGH (ref 13.0–17.0)
MCH: 31.1 pg (ref 26.0–34.0)
MCHC: 33.6 g/dL (ref 30.0–36.0)
MCV: 92.4 fL (ref 80.0–100.0)
Platelets: 162 10*3/uL (ref 150–400)
RBC: 6.02 MIL/uL — ABNORMAL HIGH (ref 4.22–5.81)
RDW: 13.6 % (ref 11.5–15.5)
WBC: 10.8 10*3/uL — ABNORMAL HIGH (ref 4.0–10.5)
nRBC: 0 % (ref 0.0–0.2)

## 2023-04-07 LAB — DIFFERENTIAL
Abs Immature Granulocytes: 0.03 10*3/uL (ref 0.00–0.07)
Basophils Absolute: 0.1 10*3/uL (ref 0.0–0.1)
Basophils Relative: 1 %
Eosinophils Absolute: 0.1 10*3/uL (ref 0.0–0.5)
Eosinophils Relative: 1 %
Immature Granulocytes: 0 %
Lymphocytes Relative: 15 %
Lymphs Abs: 1.6 10*3/uL (ref 0.7–4.0)
Monocytes Absolute: 0.6 10*3/uL (ref 0.1–1.0)
Monocytes Relative: 5 %
Neutro Abs: 8.4 10*3/uL — ABNORMAL HIGH (ref 1.7–7.7)
Neutrophils Relative %: 78 %

## 2023-04-07 LAB — URINALYSIS, ROUTINE W REFLEX MICROSCOPIC
Bilirubin Urine: NEGATIVE
Glucose, UA: >1000 mg/dL — AB
Hgb urine dipstick: NEGATIVE
Ketones, ur: NEGATIVE mg/dL
Nitrite: NEGATIVE
Specific Gravity, Urine: >1.046 — ABNORMAL HIGH (ref 1.005–1.030)
pH: 6.5 (ref 5.0–8.0)

## 2023-04-07 LAB — COMPREHENSIVE METABOLIC PANEL
ALT: 23 U/L (ref 0–44)
AST: 24 U/L (ref 15–41)
Albumin: 4.9 g/dL (ref 3.5–5.0)
Alkaline Phosphatase: 40 U/L (ref 38–126)
Anion gap: 10 (ref 5–15)
BUN: 12 mg/dL (ref 8–23)
CO2: 23 mmol/L (ref 22–32)
Calcium: 9.4 mg/dL (ref 8.9–10.3)
Chloride: 101 mmol/L (ref 98–111)
Creatinine, Ser: 1.04 mg/dL (ref 0.61–1.24)
GFR, Estimated: >60 mL/min (ref >60)
Glucose, Bld: 177 mg/dL — ABNORMAL HIGH (ref 70–99)
Potassium: 3.9 mmol/L (ref 3.5–5.1)
Sodium: 134 mmol/L — ABNORMAL LOW (ref 135–145)
Total Bilirubin: 1.6 mg/dL — ABNORMAL HIGH (ref 0.0–1.2)
Total Protein: 8 g/dL (ref 6.5–8.1)

## 2023-04-07 LAB — MRSA NEXT GEN BY PCR, NASAL: MRSA by PCR Next Gen: NOT DETECTED

## 2023-04-07 LAB — ETHANOL: Alcohol, Ethyl (B): <10 mg/dL (ref <10)

## 2023-04-07 LAB — CBG MONITORING, ED
Glucose-Capillary: 200 mg/dL — ABNORMAL HIGH (ref 70–99)
Glucose-Capillary: 125 mg/dL — ABNORMAL HIGH (ref 70–99)

## 2023-04-07 LAB — PROTIME-INR
INR: 1.9 — ABNORMAL HIGH (ref 0.8–1.2)
Prothrombin Time: 22.4 s — ABNORMAL HIGH (ref 11.4–15.2)

## 2023-04-07 LAB — HIV ANTIBODY (ROUTINE TESTING W REFLEX): HIV Screen 4th Generation wRfx: NONREACTIVE

## 2023-04-07 LAB — HEMOGLOBIN A1C
Hgb A1c MFr Bld: 9.3 % — ABNORMAL HIGH (ref 4.8–5.6)
Mean Plasma Glucose: 220.21 mg/dL

## 2023-04-07 LAB — RAPID URINE DRUG SCREEN, HOSP PERFORMED
Amphetamines: NOT DETECTED
Barbiturates: NOT DETECTED
Benzodiazepines: NOT DETECTED
Cocaine: NOT DETECTED
Opiates: NOT DETECTED
Tetrahydrocannabinol: NOT DETECTED

## 2023-04-07 LAB — GLUCOSE, CAPILLARY: Glucose-Capillary: 129 mg/dL — ABNORMAL HIGH (ref 70–99)

## 2023-04-07 LAB — APTT: aPTT: 33 s (ref 24–36)

## 2023-04-07 MED ORDER — DORZOLAMIDE HCL-TIMOLOL MAL 2-0.5 % OP SOLN
1.0000 [drp] | Freq: Two times a day (BID) | OPHTHALMIC | Status: DC
  Administered 2023-04-07 – 2023-04-09 (×4): 1 [drp] via OPHTHALMIC
  Filled 2023-04-07: qty 10

## 2023-04-07 MED ORDER — ACETAMINOPHEN 325 MG PO TABS: 650.0000 mg | ORAL_TABLET | ORAL | Status: DC | PRN

## 2023-04-07 MED ORDER — SENNOSIDES-DOCUSATE SODIUM 8.6-50 MG PO TABS: 1.0000 | ORAL_TABLET | Freq: Every evening | ORAL | Status: DC | PRN

## 2023-04-07 MED ORDER — CHLORHEXIDINE GLUCONATE CLOTH 2 % EX PADS
6.0000 | MEDICATED_PAD | Freq: Every day | CUTANEOUS | Status: DC
  Administered 2023-04-07 – 2023-04-09 (×3): 6 via TOPICAL

## 2023-04-07 MED ORDER — ASPIRIN 81 MG PO TBEC
81.0000 mg | DELAYED_RELEASE_TABLET | Freq: Every day | ORAL | Status: DC
  Administered 2023-04-08 – 2023-04-09 (×2): 81 mg via ORAL
  Filled 2023-04-07 (×2): qty 1

## 2023-04-07 MED ORDER — ADULT MULTIVITAMIN W/MINERALS CH
1.0000 | ORAL_TABLET | Freq: Every day | ORAL | Status: DC
  Administered 2023-04-08 – 2023-04-09 (×2): 1 via ORAL
  Filled 2023-04-07 (×2): qty 1

## 2023-04-07 MED ORDER — VALACYCLOVIR HCL 500 MG PO TABS
1000.0000 mg | ORAL_TABLET | Freq: Every day | ORAL | Status: DC
  Administered 2023-04-08 – 2023-04-09 (×2): 1000 mg via ORAL
  Filled 2023-04-07 (×2): qty 2

## 2023-04-07 MED ORDER — ACETAMINOPHEN 160 MG/5ML PO SOLN: 650.0000 mg | ORAL | Status: DC | PRN

## 2023-04-07 MED ORDER — INSULIN ASPART 100 UNIT/ML IJ SOLN
0.0000 [IU] | Freq: Three times a day (TID) | INTRAMUSCULAR | Status: DC
Start: 2023-04-07 — End: 2023-04-09
  Administered 2023-04-07: 2 [IU] via SUBCUTANEOUS
  Administered 2023-04-08: 3 [IU] via SUBCUTANEOUS
  Administered 2023-04-08 – 2023-04-09 (×3): 2 [IU] via SUBCUTANEOUS
  Administered 2023-04-09: 5 [IU] via SUBCUTANEOUS

## 2023-04-07 MED ORDER — STROKE: EARLY STAGES OF RECOVERY BOOK
Freq: Once | Status: AC
  Filled 2023-04-07: qty 1

## 2023-04-07 MED ORDER — INSULIN ASPART 100 UNIT/ML IJ SOLN
0.0000 [IU] | Freq: Every day | INTRAMUSCULAR | Status: DC
  Administered 2023-04-08: 2 [IU] via SUBCUTANEOUS

## 2023-04-07 MED ORDER — ACETAMINOPHEN 650 MG RE SUPP: 650.0000 mg | RECTAL | Status: DC | PRN

## 2023-04-07 MED ORDER — IOHEXOL 350 MG/ML SOLN
100.0000 mL | Freq: Once | INTRAVENOUS | Status: AC | PRN
  Administered 2023-04-07: 100 mL via INTRAVENOUS

## 2023-04-07 MED ORDER — SODIUM CHLORIDE 0.9 % IV SOLN: INTRAVENOUS | Status: DC

## 2023-04-07 MED ORDER — PANTOPRAZOLE SODIUM 40 MG PO TBEC
40.0000 mg | DELAYED_RELEASE_TABLET | Freq: Every day | ORAL | Status: DC
  Administered 2023-04-08 – 2023-04-09 (×2): 40 mg via ORAL
  Filled 2023-04-07 (×2): qty 1

## 2023-04-07 MED ORDER — TAMSULOSIN HCL 0.4 MG PO CAPS
0.4000 mg | ORAL_CAPSULE | Freq: Every day | ORAL | Status: DC
  Administered 2023-04-08 – 2023-04-09 (×2): 0.4 mg via ORAL
  Filled 2023-04-07 (×2): qty 1

## 2023-04-07 NOTE — ED Triage Notes (Signed)
 Pt BIB [facility] from [facility] d/t CT finding small vessel occlusion & was brought here for Neuro admission. Pt was initially [facility]KN at 2300 yesterday with s/s of slurred speech & tingling in his lips. [facility] reports BP has been 160/100, NIH of 3, A/Ox4, Lt AC PIV.

## 2023-04-07 NOTE — ED Provider Notes (Signed)
   ED Course / MDM   Clinical Course as of 04/07/23 1550  Wed Apr 07, 2023  1240 Code stroke activated.  Patient will be evaluated by Dr.Arora via teleneuro cart [MP]  1425 [facility] critical transport team has arrived in the ED and is preparing patient for transfer to [facility].  He has remained hemodynamically stable and neurologically intact.  Last blood pressure 150 systolic.  Permissive hypertension with blood pressure goal of 140- 200 systolic [MP]  1549 Patient transferred from [facility] ER for stroke admission. Patient aware of plan. Neurology plans to admit. Patient has no complaints at this time. Still quite dysarthric.   [WS]    Clinical Course User Index [MP] Royanne Foots, DO [WS] Lonell Grandchild, MD   Medical Decision Making Amount and/or Complexity of Data Reviewed Labs: ordered.  Risk Prescription drug management.     ICD-10-CM   1. Acute ischemic stroke (HCC)  I63.9             Lonell Grandchild, MD 04/07/23 [phone]

## 2023-04-07 NOTE — ED Notes (Signed)
 Called Monisha at [facility] for STAT transport 13:50 Landry Dyke [facility] ED

## 2023-04-07 NOTE — ED Triage Notes (Signed)
 Pt via pov from home with balance issues, slurred speech, tingling in lips. Pt had stroke 6 years ago; no residual effects. LKW 1030 pm last night. Awakened this morning at 0730 with symptoms. CBG 200. Pt alert & oriented, nad noted.

## 2023-04-07 NOTE — Progress Notes (Signed)
 Received a call from Dr. Wilford Corner for a code stroke activation at [facility]. TSRN joined [facility] 1 for exam with Dr. Wilford Corner at [phone]. LKWT at bedtime last night, pt woke up with slurred speech. Pt taken to CT at 1258. Dr. Wilford Corner and TSRN logged off cart at 1305. TSRN notified by Dr. Wilford Corner that CT imaging shows a small area of occlusion in the basilar with patent PCAs. Dr. Wilford Corner has spoken with IR, pt will be watched closely in ICU and if status worsens possible thrombectomy.

## 2023-04-07 NOTE — ED Notes (Signed)
 Pt updated that he has an IP room assigned, room currently being cleaned, also advised that a diet order was placed for him. Pt is irritable d/t "everything is taking forever and bed is uncomfortable". Pt repositioned in bed and advised once on unit, he would be provided a more comfortable bed, also asked pt if he would please kindle refrain from using profound language when calling out on the call bell, as pt stated to unit secretary who answer the call light"I need some God Damn help in here". Pt verbalized understanding and apologize, previous RN Elliot Gurney advised that pt has been rude during his stay.

## 2023-04-07 NOTE — Consult Note (Signed)
 [facility] Telemedicine Consult   Requesting Provider: Dr Elayne Snare Consult Participants: Dr. Marthe Patch, Telespecialist RN Amy   bedside RN Toni Amend Location of the provider: [facility] Location of the patient: [facility]  This consult was provided via telemedicine with 2-way video and audio communication. The patient/family was informed that care would be provided in this way and agreed to receive care in this manner.   Chief Complaint: Speech problems  HPI: 63 year old man with past medical history of Burkitt's lymphoma, old stroke with residual balance issues, DVT on Xarelto last dose this morning, diabetes, hypertension, hyperlipidemia-last known well at 2300 hrs. yesterday when he went to bed, woke up this morning with trouble with his speech.  He describes that as more of dysarthria and not difficulty with wanting to say what he wants to say. No tingling numbness weakness. No changes in his recent medications I was approached by the ED provider, with a secure chat message asking if a code stroke should be activated-history provided initially was that he has aphasia but on further teasing this out, he has more of articulation problems/dysarthria. In any case, I recommended a code stroke be activated with concern for aphasia initially.    Past Medical History:  Diagnosis Date   Abnormal liver function test 08/31/2011   Mild elevation bilirubin, normal/high hemoglobin, normal LDH   Adult ADHD    Aneurysm (HCC) 10/23/2014   4mm left anterior communicating artery region, noted on MRI head   Anxiety    Burkitt's lymphoma (HCC)    followed by Dr. Mancel Bale   Cerebellar infarct [facility]) 10/23/2014   Chronic, noted on MRI head   Chest pain    Depression    DVT (deep venous thrombosis) (HCC) 12/2015   Left leg   Elevated PSA    Glaucoma    Headache    History of double vision 2016   HTN (hypertension), benign 08/31/2011   Hyperlipidemia    Hypotestosteronemia     Polycythemia, secondary 08/31/2011   Likely due to testosterone injections   Stroke (HCC)    White matter disease 10/23/2014   noted on MRI head, "extensive"    No current facility-administered medications for this encounter.  Current Outpatient Medications:    dextroamphetamine (DEXTROSTAT) 5 MG tablet, Take 5 mg by mouth 3 (three) times daily., Disp: , Rfl:    dorzolamide-timolol (COSOPT) 22.3-6.8 MG/ML ophthalmic solution, Place 1 drop into the right eye 2 (two) times daily., Disp: , Rfl:    latanoprost (XALATAN) 0.005 % ophthalmic solution, Apply 1 drop to eye at bedtime., Disp: , Rfl:    linagliptin (TRADJENTA) 5 MG TABS tablet, Take 1 tablet (5 mg total) by mouth daily., Disp: 30 tablet, Rfl: 1   lisinopril (PRINIVIL,ZESTRIL) 40 MG tablet, Take 40 mg by mouth daily. , Disp: , Rfl:    omeprazole (PRILOSEC) 20 MG capsule, Take 20 mg by mouth daily., Disp: , Rfl:    OZEMPIC, 0.25 OR 0.5 MG/DOSE, 2 MG/1.5ML SOPN, Inject into the skin., Disp: , Rfl:    sildenafil (VIAGRA) 100 MG tablet, Take 100 mg by mouth daily as needed for erectile dysfunction. , Disp: , Rfl:    Testosterone 20.25 MG/ACT (1.62%) GEL, APPLY ONE PUMP EACH UPPER ARM ONCE DAILY E29.1, Disp: , Rfl:    valACYclovir (VALTREX) 1000 MG tablet, Take 1,000 mg by mouth daily., Disp: , Rfl:    XARELTO 20 MG TABS tablet, Take 20 mg by mouth daily. , Disp: , Rfl: 5  LKW: 23 00 hrs 04/06/2023 IV thrombolysis given?: No, outside the window, also on Xarelto IR Thrombectomy? No, low NIH stroke scale. 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 Time of teleneurologist evaluation: 12:53 PM  Exam: Vitals:   04/07/23 1232  BP: (!) 175/117  Pulse: (!) 118  Resp: 16  Temp: (!) 96.3 F (35.7 C)   SpO2: 94%    General: Awake alert in no distress Neurological exam Awake alert oriented x 3.  Mild dysarthria.  No aphasia.  Cranial nerves II to XII intact.  Motor examination with no drift in any of the 4 extremities.  Sensation intact.  Coordination with no dysmetria  NIHSS 1A: Level of Consciousness - 0 1B: Ask Month and Age - 0 1C: 'Blink Eyes' & 'Squeeze Hands' - 0 2: Test Horizontal Extraocular Movements - 0 3: Test Visual Fields - 0 4: Test Facial Palsy - 0 5A: Test Left Arm Motor Drift - 0 5B: Test Right Arm Motor Drift - 0 6A: Test Left Leg Motor Drift - 0 6B: Test Right Leg Motor Drift - 0 7: Test Limb Ataxia - 0 8: Test Sensation - 0 9: Test Language/Aphasia- 0 10: Test Dysarthria - 1 11: Test Extinction/Inattention - 0 NIHSS score: 1   Imaging Reviewed: CT head and CT angio head and neck-extensive white matter disease.  No bleed.   CT angiography of the head and neck reveals distal basilar artery occlusion, new from prior MRI of the head from 2021.  Additionally left PCA is also occluded from its origin.  Right PCA is predominantly or entirely fetal origin and patent.  V4 segment of the vertebral artery on the right is also occluded-new from prior MRI.  Background intracranial atherosclerotic disease.   Labs reviewed in epic and pertinent values follow:    Latest Ref Rng & Units 04/07/2023   12:56 PM 09/05/2019    5:43 AM 09/04/2019   10:52 AM  CBC  WBC 4.0 - 10.5 K/uL 10.8  7.5  6.9   Hemoglobin 13.0 - 17.0 g/dL 16.1  09.6  04.5   Hematocrit 39.0 - 52.0 % 55.6  53.5  54.3   Platelets 150 - 400 K/uL 162  138  144       Latest Ref Rng & Units 04/07/2023   12:56 PM 09/05/2019    5:43 AM 09/04/2019   10:52 AM  BMP  Glucose 70 - 99 mg/dL 409  811  914   BUN 8 - 23 mg/dL 12  14  14    Creatinine 0.61 - 1.24 mg/dL 7.82  9.56  2.13   Sodium 135 - 145 mmol/L 134  137  137   Potassium 3.5 - 5.1 mmol/L 3.9  3.7  4.0   Chloride 98 - 111 mmol/L 101  103  102   CO2 22 - 32  mmol/L 23  25  26    Calcium 8.9 - 10.3 mg/dL 9.4  9.0  9.4      Assessment: 63 year old with prior posterior circulation stroke with residual mild gait and balance issues presenting for evaluation of sudden onset of slurred speech.  On examination has dysarthria but no evidence of aphasia.  Imaging reveals distal basilar occlusion and multifocal stenosis in the posterior circulation. Case discussed with neurointerventional list-due  to low NIH stroke scale and him being on Eliquis, with rather current stable clinical condition, decision made not to restroom for any emergent intervention but rather watch closely in the ICU. Mainstay of treatment would be making sure that he is not hypotensive and frequent neurochecks. 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 My colleague Dr. Amada Jupiter, neurohospitalist at [facility] is aware and will admit the patient upon arrival after assessing him. Again as said above, mainstay of the treatment would be to avoid hypotension.  Keep blood pressure between systolic 140 to about 200 since he is also on Eliquis and I do not want him to be excessively hypertensive. Continue frequent neurochecks-every 1 hour. Stroke workup to include 2D echo, A1c, lipid panel.  Therapy evaluations Neurology team to evaluate at [facility] and take over care. Plan was discussed with Dr. Elayne Snare, Dr. Corliss Skains over the phone and Dr. Amada Jupiter over the phone as well.  Plan was discussed with the patient and his girlfriend over the camera.    -- Milon Dikes, MD Neurologist [facility] Pager: [phone]   CRITICAL CARE ATTESTATION Performed by: Milon Dikes, MD Total  critical care time: 55 minutes Critical care time was exclusive of separately billable procedures and treating other patients and/or supervising APPs/Residents/Students Critical care was necessary to treat or prevent imminent or life-threatening deterioration. This patient is critically ill and at significant risk for neurological worsening and/or death and care requires constant monitoring. Critical care was time spent personally by me on the following activities: development of treatment plan with patient and/or surrogate as well as nursing, discussions with consultants, evaluation of patient's response to treatment, examination of patient, obtaining history from patient or surrogate, ordering and performing treatments and interventions, ordering and review of laboratory studies, ordering and review of radiographic studies, pulse oximetry, re-evaluation of patient's condition, participation in multidisciplinary rounds and medical decision making of high complexity in the care of this patient.

## 2023-04-07 NOTE — ED Provider Notes (Addendum)
 [place] EMERGENCY DEPARTMENT AT [facility] Provider Note   CSN: [id number] Arrival date & time: 04/07/23  1224     History  Chief Complaint  Patient presents with   Aphasia   Gait Problem    [name] is a 63 y.o. adult.  With a history of Burkitt's lymphoma in remission, CVA and cerebral aneurysm who presents to ED for new onset neurologic deficit.  Last known well time at 2300 last night.  Awoke around 0730 with slurred speech, paresthesias of lips, facial asymmetry and issues with balance.  Takes Xarelto daily with last dose this morning.  No headaches fevers, chest pain, shortness of breath or recent illness.  HPI     Home Medications Prior to Admission medications   Medication Sig Start Date End Date Taking? Authorizing Provider  aspirin EC 81 MG tablet Take 81 mg by mouth daily. Swallow whole.   Yes [provider]  Continuous Glucose Sensor (DEXCOM G7 SENSOR) MISC Change every 10 days to monitor blood glucose continuously 04/02/23  Yes [provider]  dextroamphetamine (DEXTROSTAT) 5 MG tablet Take 5 mg by mouth 3 (three) times daily.   Yes [provider]  dorzolamide-timolol (COSOPT) 22.3-6.8 MG/ML ophthalmic solution Place 1 drop into the right eye 2 (two) times daily. 08/13/19  Yes [provider]  JARDIANCE 10 MG TABS tablet Take 10 mg by mouth daily.   Yes [provider]  LACTASE ENZYME PO Take by mouth. *natural lactase enzyme* 3000 fcc alu   Yes [provider]  linagliptin (TRADJENTA) 5 MG TABS tablet Take 1 tablet (5 mg total) by mouth daily. 09/06/19  Yes Johnson, Clanford L, MD  lisinopril (PRINIVIL,ZESTRIL) 40 MG tablet Take 40 mg by mouth daily.  08/07/14  Yes [provider]  Multiple Vitamin (MULTIVITAMIN WITH MINERALS) TABS tablet Take 1 tablet by mouth daily.   Yes [provider]  Netarsudil-Latanoprost (ROCKLATAN) 0.02-0.005 % SOLN Apply to eye.   Yes [provider]  omeprazole (PRILOSEC) 20 MG capsule Take 20 mg by mouth daily.   Yes [provider]  Probiotic Product (PROBIOTIC PO) Take by mouth. *10 strain probiotic* contains 10 strains of beneficial bacteria with 10 mg b infantis   Yes [provider]  sildenafil (VIAGRA) 100 MG tablet Take 100 mg by mouth daily as needed for erectile dysfunction.    Yes [provider]  tamsulosin (FLOMAX) 0.4 MG CAPS capsule Take 0.4 mg by mouth daily.   Yes [provider]  testosterone cypionate (DEPOTESTOSTERONE CYPIONATE) 200 MG/ML injection Inject 200 mg into the muscle every 14 (fourteen) days.   Yes [provider]  valACYclovir (VALTREX) 1000 MG tablet Take 1,000 mg by mouth daily.   Yes [provider]  XARELTO 20 MG TABS tablet Take 20 mg by mouth daily.  08/22/16  Yes [provider]      Allergies    Codeine, Hydrocodone-acetaminophen, and Vicodin [hydrocodone-acetaminophen]    Review of Systems   Review of Systems  Physical Exam Updated Vital Signs BP (!) 150/115   Pulse (!) 103   Temp (!) 96.3 F (35.7 C)   Resp 18   Ht 6\' 1"  (1.854 m)   Wt 99.1 kg   SpO2 94%   BMI 28.82 kg/m  Physical Exam Vitals and nursing note reviewed.  HENT:     Head: Normocephalic and atraumatic.  Eyes:     Pupils: Pupils are equal, round, and reactive to light.  Cardiovascular:     Rate and Rhythm: Normal rate and regular rhythm.  Pulmonary:     Effort: Pulmonary effort is normal.     Breath sounds: Normal breath sounds.  Abdominal:     Palpations: Abdomen is soft.     Tenderness: There is no abdominal tenderness.  Skin:    General: Skin is warm and dry.  Neurological:     Mental Status: She is alert and oriented to person, place, and time.     Comments: No facial asymmetry Slurred speech Able to name objects No pronator drift 5 out of 5 motor strength bilateral upper and lower extremities No visual field deficits Normal finger-nose   Psychiatric:        Mood and Affect: Mood normal.     ED Results / Procedures / Treatments   Labs (all labs ordered are listed, but only abnormal results are displayed) Labs Reviewed  PROTIME-INR - Abnormal; Notable for the following components:      Result Value   Prothrombin Time 22.4 (*)    INR 1.9 (*)    All other components within normal limits  CBC - Abnormal; Notable for the following components:   WBC 10.8 (*)    RBC 6.02 (*)    Hemoglobin 18.7 (*)    HCT 55.6 (*)    All other components within normal limits  DIFFERENTIAL - Abnormal; Notable for the following components:   Neutro Abs 8.4 (*)    All other components within normal limits  COMPREHENSIVE METABOLIC PANEL - Abnormal; Notable for the following components:   Sodium 134 (*)    Glucose, Bld 177 (*)    Total Bilirubin 1.6 (*)    All other components within normal limits  URINALYSIS, ROUTINE W REFLEX MICROSCOPIC - Abnormal; Notable for the following components:   Specific Gravity, Urine >1.046 (*)    Glucose, UA >1,000 (*)    Protein, ur TRACE (*)    Leukocytes,Ua SMALL (*)    Bacteria, UA RARE (*)    All other components within normal limits  CBG MONITORING, ED - Abnormal; Notable for the following components:   Glucose-Capillary 200 (*)    All other components within normal limits  APTT  ETHANOL  RAPID URINE DRUG SCREEN, HOSP PERFORMED    EKG EKG Interpretation Date/Time:  Wednesday April 07 2023 12:47:48 EST Ventricular Rate:  114 PR Interval:  152 QRS Duration:  90 QT Interval:  322 QTC Calculation: 444 R Axis:   9  Text Interpretation: Sinus tachycardia Confirmed by Estelle June [phone]) on 04/07/2023 2:29:04 PM  Radiology CT ANGIO HEAD NECK W WO CM W PERF (CODE STROKE) Result Date: 04/07/2023 CLINICAL DATA:  Provided history: Neuro deficit, acute, stroke suspected. Additional history provided: Balance difficulty, slurred speech, lip tingling. EXAM: CT ANGIOGRAPHY HEAD AND NECK CT PERFUSION  BRAIN TECHNIQUE: Multidetector CT imaging of the head and neck was performed using the standard protocol during bolus administration of intravenous contrast. Multiplanar CT image reconstructions and MIPs were obtained to evaluate the vascular anatomy. Carotid stenosis measurements (when applicable) are obtained utilizing NASCET criteria, using the distal internal carotid diameter as the denominator. Multiphase CT imaging of the brain was performed following IV bolus contrast injection. Subsequent parametric perfusion maps were calculated using RAPID software. RADIATION DOSE REDUCTION: This exam was performed according to the departmental dose-optimization program which includes automated exposure control, adjustment of the mA and/or kV according to patient size and/or use of iterative reconstruction technique. CONTRAST:  Administered contrast  not known at this time. COMPARISON:  MRI brain and MRA head 09/05/2019. FINDINGS: CT HEAD FINDINGS Brain: Generalized cerebral and cerebellar atrophy. Chronic lacunar infarct within the left external capsule. Advanced patchy and ill-defined hypoattenuation elsewhere within the cerebral white matter, nonspecific but compatible with chronic small vessel disease. Subcentimeter age-indeterminate infarct within the right cerebellar hemisphere (series 1, image 8). Redemonstrated small chronic infarct within the left cerebellar hemisphere. There is no acute intracranial hemorrhage. No demarcated cortical infarct. No extra-axial fluid collection. No evidence of an intracranial mass. No midline shift. Vascular: No hyperdense vessel.  Atherosclerotic calcifications. Skull: No calvarial fracture or aggressive osseous lesion. Sinuses/Orbits: No mass or acute finding within the imaged orbits. Mild mucosal thickening within the right maxillary sinus. ASPECTS ([place] Stroke Program Early CT Score) - Ganglionic level infarction (caudate, lentiform nuclei, internal capsule, insula, M1-M3  cortex): 7 - Supraganglionic infarction (M4-M6 cortex): 3 Total score (0-10 with 10 being normal): 10 Review of the MIP images confirms the above findings Study findings discussed by telephone at the time of interpretation on 04/07/2023 at 1:20 pm with provider ASHISH ARORA. CTA NECK FINDINGS Aortic arch: Standard aortic branching. The visualized thoracic aorta is normal in caliber. Streak/beam hardening artifact arising from a dense contrast bolus partially obscures the left subclavian artery. Within this limitation, there is no appreciable hemodynamically significant innominate or proximal subclavian artery stenosis. Right carotid system: CCA and ICA patent within the neck without stenosis or significant atherosclerotic disease. Left carotid system: CCA and ICA patent within the neck. Soft plaque and/or carotid web within the carotid bulb resulting in less than 50% stenosis. Vertebral arteries: Venous reflux of contrast limits evaluation of the V1 and proximal V2 vertebral arteries bilaterally. Within this limitation, the vertebral arteries are patent within the neck without stenosis or significant atherosclerotic disease. Skeleton: Levocurvature of the lower cervical and visualized upper thoracic spine. Cervical spondylosis. No acute fracture or aggressive osseous lesion. Other neck: Subcentimeter thyroid nodules not meeting consensus criteria for ultrasound follow-up based on size. No follow-up imaging recommended. Reference: J Am Coll Radiol. 2015 Feb;12(2): 143-50. Upper chest: Multiple pleural-based right upper lobe pulmonary nodules measuring up to 14 mm. Review of the MIP images confirms the above findings CTA HEAD FINDINGS Anterior circulation: The intracranial internal carotid arteries are patent. Nonstenotic atherosclerotic plaque within the supraclinoid left ICA. The M1 middle cerebral arteries are patent. Atherosclerotic irregularity of the M2 and more distal MCA vessels, bilaterally. No M2 proximal  branch occlusion is identified. The anterior cerebral arteries are patent. Atherosclerotic irregularity of both vessels. Most notably, there are sites of severe stenosis within the left anterior cerebral artery A3 segment. 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 Posterior circulation: New from the prior MRA head of 09/05/2019, the intracranial right vertebral artery is occluded beyond the PICA origin. The intracranial left vertebral artery is patent. New from the prior MRA, the distal basilar artery is occluded (just beyond the SCA origins). Enhancement is present within the proximal superior cerebellar arteries bilaterally. The left posterior cerebral artery is occluded from its origin. The left posterior communicating artery is diminutive or absent. The right posterior cerebral artery is predominantly (or entirely) fetal in origin, and patent. Severe stenosis within the right PCA P2 segment, unchanged from the prior MRA. Venous sinuses: Within the limitations of contrast timing, no convincing thrombus. Anatomic variants: As described. Review of the MIP images confirms the above findings CT Brain Perfusion Findings: CBF (<30%) Volume:  0mL Perfusion (Tmax>6.0s) volume: 42mL Mismatch Volume: 42mL Infarction Location:None identified. CTA head Impressions #1 and #2 called by telephone at the time of interpretation on 04/07/2023 at 1:20 pm to provider [facility], who verbally acknowledged these results. IMPRESSION: Non-contrast head CT: 1. Small age-indeterminate infarct within the right cerebellar hemisphere. 2. Background parenchymal atrophy, chronic small vessel ischemic disease and chronic infarcts. CTA neck: 1. The common carotid and internal carotid arteries are patent within the neck. Soft plaque and/or a carotid web within the left carotid bulb, resulting in less than 50% stenosis. 2. Venous reflux of contrast limits evaluation of the bilateral  vertebral artery V1 and proximal V2 segments. Within this limitation, the cervical vertebral arteries are patent without stenosis or significant atherosclerotic disease. 3. Multiple pleural-based right upper lobe pulmonary nodules measuring up to 14 mm. Non-contrast chest CT at 3-6 months is recommended. If the nodules are stable at time of repeat CT, then future CT at 18-24 months (from today's scan) is considered optional for low-risk patients, but is recommended for high-risk patients. This recommendation follows the consensus statement: Guidelines for Management of Incidental Pulmonary Nodules Detected on CT Images: From the Fleischner Society 2017; Radiology 2017; 284:228-243. CTA head: 1. Distal basilar artery occlusion, new from the prior MRA head of 09/05/2019. Additionally, the left posterior cerebral artery is also occluded from its origin. The right posterior cerebral artery is predominantly (or entirely) fetal in origin and patent. 2. 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 3. Background intracranial atherosclerotic disease as described. Most notably, there is an unchanged severe stenosis within the right posterior cerebral artery P2 segment, and there are severe stenoses within the left anterior cerebral artery A3 segment. 4. 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 CT perfusion head: The perfusion software identifies regions of critically hypoperfused parenchyma within the posterior circulation totaling 42 mL (utilizing the Tmax>6 seconds threshold). No core infarct is identified. Reported mismatch volume: 42 mL. Electronically Signed   By: Jackey Loge D.O.   On: 04/07/2023 14:06   CT CEREBRAL PERFUSION W CONTRAST Result Date: 04/07/2023 CLINICAL DATA:  Provided history: Neuro deficit, acute, stroke suspected. Additional history provided: Balance difficulty, slurred speech, lip tingling. EXAM: CT ANGIOGRAPHY HEAD AND NECK CT PERFUSION  BRAIN TECHNIQUE: Multidetector CT imaging of the head and neck was performed using the standard protocol during bolus administration of intravenous contrast. Multiplanar CT image reconstructions and MIPs were obtained to evaluate the vascular anatomy. Carotid stenosis measurements (when applicable) are obtained utilizing NASCET criteria, using the distal internal carotid diameter as the denominator. Multiphase CT imaging of the brain was performed following IV bolus contrast injection. Subsequent parametric perfusion maps were calculated using RAPID software. RADIATION DOSE REDUCTION: This exam was performed according to the departmental dose-optimization program which includes automated exposure control, adjustment of the mA and/or kV according to patient size and/or use of iterative reconstruction technique. CONTRAST:  Administered contrast not known at this time. COMPARISON:  MRI brain and MRA head 09/05/2019. FINDINGS: CT HEAD FINDINGS Brain: Generalized cerebral and cerebellar atrophy. Chronic lacunar infarct within the left external capsule. Advanced patchy and ill-defined hypoattenuation elsewhere within the cerebral white matter, nonspecific but compatible with chronic small vessel disease. Subcentimeter age-indeterminate infarct within the right cerebellar hemisphere (series 1, image 8). Redemonstrated small chronic infarct within the left cerebellar hemisphere. There is no acute intracranial hemorrhage. No demarcated cortical infarct. No extra-axial fluid collection. No evidence of an intracranial mass.  No midline shift. Vascular: No hyperdense vessel.  Atherosclerotic calcifications. Skull: No calvarial fracture or aggressive osseous lesion. Sinuses/Orbits: No mass or acute finding within the imaged orbits. Mild mucosal thickening within the right maxillary sinus. ASPECTS ([place] Stroke Program Early CT Score) - Ganglionic level infarction (caudate, lentiform nuclei, internal capsule, insula, M1-M3  cortex): 7 - Supraganglionic infarction (M4-M6 cortex): 3 Total score (0-10 with 10 being normal): 10 Review of the MIP images confirms the above findings Study findings discussed by telephone at the time of interpretation on 04/07/2023 at 1:20 pm with provider ASHISH ARORA. CTA NECK FINDINGS Aortic arch: Standard aortic branching. The visualized thoracic aorta is normal in caliber. Streak/beam hardening artifact arising from a dense contrast bolus partially obscures the left subclavian artery. Within this limitation, there is no appreciable hemodynamically significant innominate or proximal subclavian artery stenosis. Right carotid system: CCA and ICA patent within the neck without stenosis or significant atherosclerotic disease. Left carotid system: CCA and ICA patent within the neck. Soft plaque and/or carotid web within the carotid bulb resulting in less than 50% stenosis. Vertebral arteries: Venous reflux of contrast limits evaluation of the V1 and proximal V2 vertebral arteries bilaterally. Within this limitation, the vertebral arteries are patent within the neck without stenosis or significant atherosclerotic disease. Skeleton: Levocurvature of the lower cervical and visualized upper thoracic spine. Cervical spondylosis. No acute fracture or aggressive osseous lesion. Other neck: Subcentimeter thyroid nodules not meeting consensus criteria for ultrasound follow-up based on size. No follow-up imaging recommended. Reference: J Am Coll Radiol. 2015 Feb;12(2): 143-50. Upper chest: Multiple pleural-based right upper lobe pulmonary nodules measuring up to 14 mm. Review of the MIP images confirms the above findings CTA HEAD FINDINGS Anterior circulation: The intracranial internal carotid arteries are patent. Nonstenotic atherosclerotic plaque within the supraclinoid left ICA. The M1 middle cerebral arteries are patent. Atherosclerotic irregularity of the M2 and more distal MCA vessels, bilaterally. No M2 proximal  branch occlusion is identified. The anterior cerebral arteries are patent. Atherosclerotic irregularity of both vessels. Most notably, there are sites of severe stenosis within the left anterior cerebral artery A3 segment. 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 Posterior circulation: New from the prior MRA head of 09/05/2019, the intracranial right vertebral artery is occluded beyond the PICA origin. The intracranial left vertebral artery is patent. New from the prior MRA, the distal basilar artery is occluded (just beyond the SCA origins). Enhancement is present within the proximal superior cerebellar arteries bilaterally. The left posterior cerebral artery is occluded from its origin. The left posterior communicating artery is diminutive or absent. The right posterior cerebral artery is predominantly (or entirely) fetal in origin, and patent. Severe stenosis within the right PCA P2 segment, unchanged from the prior MRA. Venous sinuses: Within the limitations of contrast timing, no convincing thrombus. Anatomic variants: As described. Review of the MIP images confirms the above findings CT Brain Perfusion Findings: CBF (<30%) Volume: 0mL Perfusion (Tmax>6.0s) volume: 42mL Mismatch Volume: 42mL Infarction Location:None identified. CTA head Impressions #1 and #2 called by telephone at the time of interpretation on 04/07/2023 at 1:20 pm to provider [facility], who verbally acknowledged these results. IMPRESSION: Non-contrast head CT: 1. Small age-indeterminate infarct within the right cerebellar hemisphere. 2. Background parenchymal atrophy, chronic small vessel ischemic disease and chronic infarcts. CTA neck: 1. The common carotid and internal carotid arteries are patent within the neck. Soft plaque and/or a carotid web within the left carotid bulb, resulting  in less than 50% stenosis. 2. Venous reflux of contrast limits evaluation of the bilateral  vertebral artery V1 and proximal V2 segments. Within this limitation, the cervical vertebral arteries are patent without stenosis or significant atherosclerotic disease. 3. Multiple pleural-based right upper lobe pulmonary nodules measuring up to 14 mm. Non-contrast chest CT at 3-6 months is recommended. If the nodules are stable at time of repeat CT, then future CT at 18-24 months (from today's scan) is considered optional for low-risk patients, but is recommended for high-risk patients. This recommendation follows the consensus statement: Guidelines for Management of Incidental Pulmonary Nodules Detected on CT Images: From the Fleischner Society 2017; Radiology 2017; 284:228-243. CTA head: 1. Distal basilar artery occlusion, new from the prior MRA head of 09/05/2019. Additionally, the left posterior cerebral artery is also occluded from its origin. The right posterior cerebral artery is predominantly (or entirely) fetal in origin and patent. 2. 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 3. Background intracranial atherosclerotic disease as described. Most notably, there is an unchanged severe stenosis within the right posterior cerebral artery P2 segment, and there are severe stenoses within the left anterior cerebral artery A3 segment. 4. 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 CT perfusion head: The perfusion software identifies regions of critically hypoperfused parenchyma within the posterior circulation totaling 42 mL (utilizing the Tmax>6 seconds threshold). No core infarct is identified. Reported mismatch volume: 42 mL. Electronically Signed   By: Jackey Loge D.O.   On: 04/07/2023 14:06   CT HEAD CODE STROKE WO CONTRAST Result Date: 04/07/2023 CLINICAL DATA:  Provided history: Neuro deficit, acute, stroke suspected. Additional history provided: Balance difficulty, slurred speech, lip tingling. EXAM: CT ANGIOGRAPHY HEAD AND NECK CT PERFUSION  BRAIN TECHNIQUE: Multidetector CT imaging of the head and neck was performed using the standard protocol during bolus administration of intravenous contrast. Multiplanar CT image reconstructions and MIPs were obtained to evaluate the vascular anatomy. Carotid stenosis measurements (when applicable) are obtained utilizing NASCET criteria, using the distal internal carotid diameter as the denominator. Multiphase CT imaging of the brain was performed following IV bolus contrast injection. Subsequent parametric perfusion maps were calculated using RAPID software. RADIATION DOSE REDUCTION: This exam was performed according to the departmental dose-optimization program which includes automated exposure control, adjustment of the mA and/or kV according to patient size and/or use of iterative reconstruction technique. CONTRAST:  Administered contrast not known at this time. COMPARISON:  MRI brain and MRA head 09/05/2019. FINDINGS: CT HEAD FINDINGS Brain: Generalized cerebral and cerebellar atrophy. Chronic lacunar infarct within the left external capsule. Advanced patchy and ill-defined hypoattenuation elsewhere within the cerebral white matter, nonspecific but compatible with chronic small vessel disease. Subcentimeter age-indeterminate infarct within the right cerebellar hemisphere (series 1, image 8). Redemonstrated small chronic infarct within the left cerebellar hemisphere. There is no acute intracranial hemorrhage. No demarcated cortical infarct. No extra-axial fluid collection. No evidence of an intracranial mass. No midline shift. Vascular: No hyperdense vessel.  Atherosclerotic calcifications. Skull: No calvarial fracture or aggressive osseous lesion. Sinuses/Orbits: No mass or acute finding within the imaged orbits. Mild mucosal thickening within the right maxillary sinus. ASPECTS ([place] Stroke Program Early CT Score) - Ganglionic level infarction (caudate, lentiform nuclei, internal capsule, insula, M1-M3  cortex): 7 - Supraganglionic infarction (M4-M6 cortex): 3 Total score (0-10 with 10 being normal): 10 Review of the MIP images confirms the above findings Study findings discussed by telephone at the time of interpretation  on 04/07/2023 at 1:20 pm with provider ASHISH ARORA. CTA NECK FINDINGS Aortic arch: Standard aortic branching. The visualized thoracic aorta is normal in caliber. Streak/beam hardening artifact arising from a dense contrast bolus partially obscures the left subclavian artery. Within this limitation, there is no appreciable hemodynamically significant innominate or proximal subclavian artery stenosis. Right carotid system: CCA and ICA patent within the neck without stenosis or significant atherosclerotic disease. Left carotid system: CCA and ICA patent within the neck. Soft plaque and/or carotid web within the carotid bulb resulting in less than 50% stenosis. Vertebral arteries: Venous reflux of contrast limits evaluation of the V1 and proximal V2 vertebral arteries bilaterally. Within this limitation, the vertebral arteries are patent within the neck without stenosis or significant atherosclerotic disease. Skeleton: Levocurvature of the lower cervical and visualized upper thoracic spine. Cervical spondylosis. No acute fracture or aggressive osseous lesion. Other neck: Subcentimeter thyroid nodules not meeting consensus criteria for ultrasound follow-up based on size. No follow-up imaging recommended. Reference: J Am Coll Radiol. 2015 Feb;12(2): 143-50. Upper chest: Multiple pleural-based right upper lobe pulmonary nodules measuring up to 14 mm. Review of the MIP images confirms the above findings CTA HEAD FINDINGS Anterior circulation: The intracranial internal carotid arteries are patent. Nonstenotic atherosclerotic plaque within the supraclinoid left ICA. The M1 middle cerebral arteries are patent. Atherosclerotic irregularity of the M2 and more distal MCA vessels, bilaterally. No M2 proximal  branch occlusion is identified. The anterior cerebral arteries are patent. Atherosclerotic irregularity of both vessels. Most notably, there are sites of severe stenosis within the left anterior cerebral artery A3 segment. 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 Posterior circulation: New from the prior MRA head of 09/05/2019, the intracranial right vertebral artery is occluded beyond the PICA origin. The intracranial left vertebral artery is patent. New from the prior MRA, the distal basilar artery is occluded (just beyond the SCA origins). Enhancement is present within the proximal superior cerebellar arteries bilaterally. The left posterior cerebral artery is occluded from its origin. The left posterior communicating artery is diminutive or absent. The right posterior cerebral artery is predominantly (or entirely) fetal in origin, and patent. Severe stenosis within the right PCA P2 segment, unchanged from the prior MRA. Venous sinuses: Within the limitations of contrast timing, no convincing thrombus. Anatomic variants: As described. Review of the MIP images confirms the above findings CT Brain Perfusion Findings: CBF (<30%) Volume: 0mL Perfusion (Tmax>6.0s) volume: 42mL Mismatch Volume: 42mL Infarction Location:None identified. CTA head Impressions #1 and #2 called by telephone at the time of interpretation on 04/07/2023 at 1:20 pm to provider [facility], who verbally acknowledged these results. IMPRESSION: Non-contrast head CT: 1. Small age-indeterminate infarct within the right cerebellar hemisphere. 2. Background parenchymal atrophy, chronic small vessel ischemic disease and chronic infarcts. CTA neck: 1. The common carotid and internal carotid arteries are patent within the neck. Soft plaque and/or a carotid web within the left carotid bulb, resulting in less than 50% stenosis. 2. Venous reflux of contrast limits evaluation of the bilateral  vertebral artery V1 and proximal V2 segments. Within this limitation, the cervical vertebral arteries are patent without stenosis or significant atherosclerotic disease. 3. Multiple pleural-based right upper lobe pulmonary nodules measuring up to 14 mm. Non-contrast chest CT at 3-6 months is recommended. If the nodules are stable at time of repeat CT, then future CT at 18-24 months (from today's scan) is considered optional for low-risk patients, but is recommended for high-risk patients.  This recommendation follows the consensus statement: Guidelines for Management of Incidental Pulmonary Nodules Detected on CT Images: From the Fleischner Society 2017; Radiology 2017; 284:228-243. CTA head: 1. Distal basilar artery occlusion, new from the prior MRA head of 09/05/2019. Additionally, the left posterior cerebral artery is also occluded from its origin. The right posterior cerebral artery is predominantly (or entirely) fetal in origin and patent. 2. 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 3. Background intracranial atherosclerotic disease as described. Most notably, there is an unchanged severe stenosis within the right posterior cerebral artery P2 segment, and there are severe stenoses within the left anterior cerebral artery A3 segment. 4. 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 CT perfusion head: The perfusion software identifies regions of critically hypoperfused parenchyma within the posterior circulation totaling 42 mL (utilizing the Tmax>6 seconds threshold). No core infarct is identified. Reported mismatch volume: 42 mL. Electronically Signed   By: Jackey Loge D.O.   On: 04/07/2023 14:06    Procedures .Critical Care  Performed by: Royanne Foots, DO Authorized by: Royanne Foots, DO   Critical care provider statement:    Critical care time (minutes):  100   Critical care was necessary to treat or prevent imminent or life-threatening  deterioration of the following conditions:  CNS failure or compromise   Critical care was time spent personally by me on the following activities:  Development of treatment plan with patient or surrogate, discussions with consultants, evaluation of patient's response to treatment, examination of patient, ordering and review of laboratory studies, ordering and review of radiographic studies, ordering and performing treatments and interventions, pulse oximetry, re-evaluation of patient's condition and review of old charts   I assumed direction of critical care for this patient from another provider in my specialty: no     Care discussed with: admitting provider   Comments:     Care discussed with stroke neuroteam     Medications Ordered in ED Medications  iohexol (OMNIPAQUE) 350 MG/ML injection 100 mL (100 mLs Intravenous Contrast Given 04/07/23 1323)    ED Course/ Medical Decision Making/ A&P Clinical Course as of 04/07/23 1429  Wed Apr 07, 2023  1240 Code stroke activated.  Patient will be evaluated by Dr.Arora via teleneuro cart [MP]  1425 [facility] critical transport team has arrived in the ED and is preparing patient for transfer to [facility].  He has remained hemodynamically stable and neurologically intact.  Last blood pressure 150 systolic.  Permissive hypertension with blood pressure goal of 140- 200 systolic [MP]    Clinical Course User Index [MP] Royanne Foots, DO                                 Medical Decision Making 63 year old male with history as above presenting for acute neurologic deficits.  Last known well time 2300 last night.  Slurred speech and an unsteady gait.  No other focal deficits on my assessment.  NIH stroke scale score as below.  Last dose of Xarelto was this morning.  Out of the window for thrombolytics but well within the window for neuroendovascular intervention.  Code stroke was activated and patient was evaluated by Dr.Arora via teleneurology.  A stat CT  head without contrast, CTA head and neck and cerebral perfusion scan were obtained.  Imaging revealed basilar occlusion.  No plan for endovascular intervention at this time  however patient will be transferred ED to ED to [facility] in order to have neuroendovascular capability in the event of acute neurologic changes.  NIH Stroke Scale/Score (NIHSS) from [email or web address]  on 04/07/2023 ** All calculations should be rechecked by clinician prior to use **  RESULT SUMMARY: 1 points NIH Stroke Scale   INPUTS: 1A: Level of consciousness -> 0 = Alert; keenly responsive 1B: Ask month and age -> 0 = Both questions right 1C: 'Blink eyes' & 'squeeze hands' -> 0 = Performs both tasks 2: Horizontal extraocular movements -> 0 = Normal 3: Visual fields -> 0 = No visual loss 4: Facial palsy -> 0 = Normal symmetry 5A: Left arm motor drift -> 0 = No drift for 10 seconds 5B: Right arm motor drift -> 0 = No drift for 10 seconds 6A: Left leg motor drift -> 0 = No drift for 5 seconds 6B: Right leg motor drift -> 0 = No drift for 5 seconds 7: Limb Ataxia -> 0 = No ataxia 8: Sensation -> 0 = Normal; no sensory loss 9: Language/aphasia -> 0 = Normal; no aphasia 10: Dysarthria -> 1 = Mild-moderate dysarthria: slurring but can be understood 11: Extinction/inattention -> 0 = No abnormality   Amount and/or Complexity of Data Reviewed Labs: ordered. Radiology: ordered.  Risk Prescription drug management.           Final Clinical Impression(s) / ED Diagnoses Final diagnoses:  None    Rx / DC Orders ED Discharge Orders     None         Royanne Foots, DO 04/07/23 1428    Estelle June A, DO 04/07/23 1429

## 2023-04-07 NOTE — ED Notes (Signed)
 This RN attempted to give report to 4N ICU.

## 2023-04-07 NOTE — ED Notes (Signed)
 He has just left with [facility] to go to [facility] for urgent/emergent I.R.

## 2023-04-07 NOTE — ED Notes (Signed)
 I have just spoken with the secretary and offer made to report to Ladona Ridgel, RN at [facility] E.D. if she has questions, as she is unable to come to the phone at this time.

## 2023-04-07 NOTE — H&P (Signed)
 NEUROLOGY H&P NOTE   Date of service: April 07, 2023 Patient Name: [name] MRN:  [id number] DOB:  1960-07-21 Chief Complaint: "slurred speech"  History of Present Illness  [name] is a 63 y.o. adult with hx of HLD, HTN, DVT on Xarelto, Burkitts lymphoma, prior L cerebellar stroke with baseline dysequilibrium who went to bed at 2300 and woke up fine this AM around 0730. Around 0815, developed slurred speech.  He was evaluated as a code stroke at [facility] ED and found to have distal basilar occlusion with significant intracranial atherosclerotic disease of multiple vessels.  He was outside tnkase window on arrival and with his NIHSS of 1 for dysarthria, felt he was too mild for thrombectomy. He was transferred to [facility] for close monitoring in the Neuro ICU and to consider thrombectomy if his deficits become disabling.   Last known well: 0730 Modified rankin score: 0-Completely asymptomatic and back to baseline post- stroke IV Thrombolysis: Not offered, he is outside window and on Xarelto Thrombectomy: Not offered at this time given deficits are non disabling and too mild to consider thrombectomy. However, will be closely monitored in the Neuro ICU and to consider thrombectomy if his deficits become disabling. NIHSS components Score: Comment  1a Level of Conscious 0[x]  1[]  2[]  3[]      1b LOC Questions 0[x]  1[]  2[]       1c LOC Commands 0[x]  1[]  2[]       2 Best Gaze 0[x]  1[]  2[]       3 Visual 0[x]  1[]  2[]  3[]      4 Facial Palsy 0[x]  1[]  2[]  3[]      5a Motor Arm - left 0[x]  1[]  2[]  3[]  4[]  UN[]    5b Motor Arm - Right 0[x]  1[]  2[]  3[]  4[]  UN[]    6a Motor Leg - Left 0[x]  1[]  2[]  3[]  4[]  UN[]    6b Motor Leg - Right 0[x]  1[]  2[]  3[]  4[]  UN[]    7 Limb Ataxia 0[]  1[x]  2[]  3[]  UN[]   L leg ataxia on HTS, noted also back in aug 2021 on exam.  8 Sensory 0[x]  1[]  2[]  UN[]      9 Best Language 0[x]  1[]  2[]  3[]      10 Dysarthria 0[]  1[x]  2[]  UN[]      11 Extinct. and Inattention 0[x]  1[]   2[]       TOTAL: 2      ROS  Comprehensive ROS performed and pertinent positives documented in the HPI  Past History   Past Medical History:  Diagnosis Date   Abnormal liver function test 08/31/2011   Mild elevation bilirubin, normal/high hemoglobin, normal LDH   Adult ADHD    Aneurysm (HCC) 10/23/2014   4mm left anterior communicating artery region, noted on MRI head   Anxiety    Burkitt's lymphoma (HCC)    followed by Dr. Mancel Bale   Cerebellar infarct [facility]) 10/23/2014   Chronic, noted on MRI head   Chest pain    Depression    DVT (deep venous thrombosis) (HCC) 12/2015   Left leg   Elevated PSA    Glaucoma    Headache    History of double vision 2016   HTN (hypertension), benign 08/31/2011   Hyperlipidemia    Hypotestosteronemia    Polycythemia, secondary 08/31/2011   Likely due to testosterone injections   Stroke (HCC)    White matter disease 10/23/2014   noted on MRI head, "extensive"   Past Surgical History:  Procedure Laterality Date   CEREBRAL ANGIOGRAM  11/23/2014  HERNIA REPAIR     KNEE SURGERY Left    SHOULDER SURGERY     Family History  Problem Relation Age of Onset   Stroke Father        Deceased   Hypertension Mother        Living   Social History   Socioeconomic History   Marital status: Married    Spouse name: Not on file   Number of children: Not on file   Years of education: Not on file   Highest education level: Not on file  Occupational History   Not on file  Tobacco Use   Smoking status: Never   Smokeless tobacco: Never  Vaping Use   Vaping status: Never Used  Substance and Sexual Activity   Alcohol use: Yes    Alcohol/week: 0.0 standard drinks of alcohol    Comment: once a week    Drug use: No   Sexual activity: Not on file  Other Topics Concern   Not on file  Social History Narrative   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    Married, no children.    Caffeine 2 cups am, tea glasses daily (unsweetened).    Social Drivers of Corporate investment banker Strain: Not on file  Food Insecurity: Not on file  Transportation Needs: Not on file  Physical Activity: Not on file  Stress: Not on file  Social Connections: Not on file   Allergies  Allergen Reactions   Codeine Nausea Only   Hydrocodone-Acetaminophen Other (See Comments)    Makes patient sick   Vicodin [Hydrocodone-Acetaminophen] Nausea And Vomiting    Medications  No current facility-administered medications for this encounter.  Current Outpatient Medications:    aspirin EC 81 MG tablet, Take 81 mg by mouth daily. Swallow whole., Disp: , Rfl:    Continuous Glucose Sensor (DEXCOM G7 SENSOR) MISC, Change every 10 days to monitor blood glucose continuously, Disp: , Rfl:    dextroamphetamine (DEXTROSTAT) 5 MG tablet, Take 5 mg by mouth 3 (three) times daily., Disp: , Rfl:    dorzolamide-timolol (COSOPT) 22.3-6.8 MG/ML ophthalmic solution, Place 1 drop into the right eye 2 (two) times daily., Disp: , Rfl:    JARDIANCE 10 MG TABS tablet, Take 10 mg by mouth daily., Disp: , Rfl:    LACTASE ENZYME PO, Take by mouth. *natural lactase enzyme* 3000 fcc alu, Disp: , Rfl:    linagliptin (TRADJENTA) 5 MG TABS tablet, Take 1 tablet (5 mg total) by mouth daily., Disp: 30 tablet, Rfl: 1   lisinopril (PRINIVIL,ZESTRIL) 40 MG tablet, Take 40 mg by mouth daily. , Disp: , Rfl:    Multiple Vitamin (MULTIVITAMIN WITH MINERALS) TABS tablet, Take 1 tablet by mouth daily., Disp: , Rfl:    Netarsudil-Latanoprost (ROCKLATAN) 0.02-0.005 % SOLN, Apply to eye., Disp: , Rfl:    omeprazole (PRILOSEC) 20 MG capsule, Take 20 mg by mouth daily., Disp: , Rfl:    Probiotic Product (PROBIOTIC PO), Take by mouth. *10 strain probiotic* contains 10 strains of beneficial bacteria with 10 mg b infantis, Disp: , Rfl:    sildenafil (VIAGRA) 100 MG tablet, Take 100 mg by mouth daily as needed for erectile dysfunction. , Disp: , Rfl:    tamsulosin (FLOMAX) 0.4 MG CAPS capsule,  Take 0.4 mg by mouth daily., Disp: , Rfl:    testosterone cypionate (DEPOTESTOSTERONE CYPIONATE) 200 MG/ML injection, Inject 200 mg into the muscle every 14 (fourteen) days., Disp: , Rfl:    valACYclovir (  VALTREX) 1000 MG tablet, Take 1,000 mg by mouth daily., Disp: , Rfl:    XARELTO 20 MG TABS tablet, Take 20 mg by mouth daily. , Disp: , Rfl: 5   Vitals   Vitals:   04/07/23 1235 04/07/23 1330 04/07/23 1400 04/07/23 1500  BP:  (!) 163/105 (!) 150/115 (!) 173/118  Pulse:  (!) 101 (!) 103 100  Resp:  13 18 16   Temp:    98.1 F (36.7 C)  SpO2:  98% 94% 99%  Weight: 99.1 kg     Height: 6\' 1"  (1.854 m)        Body mass index is 28.82 kg/m.  Physical Exam   General: Laying comfortably in bed; in no acute distress.  HENT: Normal oropharynx and mucosa. Normal external appearance of ears and nose.  Neck: Supple, no pain or tenderness  CV: No JVD. No peripheral edema.  Pulmonary: Symmetric Chest rise. Normal respiratory effort.  Abdomen: Soft to touch, non-tender.  Ext: No cyanosis, edema, or deformity  Skin: No rash. Normal palpation of skin.   Musculoskeletal: Normal digits and nails by inspection. No clubbing.   Neurologic Examination  Mental status/Cognition: Alert, oriented to self, place, month and year, good attention.  Speech/language: dysarthric speech, fluent, comprehension intact, object naming intact, repetition intact.  Cranial nerves:   CN II Pupils equal and reactive to light, no VF deficits    CN III,IV,VI EOM intact, no gaze preference or deviation, no nystagmus    CN V normal sensation in V1, V2, and V3 segments bilaterally    CN VII no asymmetry, no nasolabial fold flattening    CN VIII normal hearing to speech    CN IX & X normal palatal elevation, no uvular deviation    CN XI 5/5 head turn and 5/5 shoulder shrug bilaterally    CN XII midline tongue protrusion    Motor:  Muscle bulk: normal, tone normal, pronator drift none tremor none Mvmt Root Nerve   Muscle Right Left Comments  SA C5/6 Ax Deltoid 5 5   EF C5/6 Mc Biceps 5 5   EE C6/7/8 Rad Triceps 5 5   WF C6/7 Med FCR     WE C7/8 PIN ECU     F Ab C8/T1 U ADM/FDI 5 5   HF L1/2/3 Fem Illopsoas 5 5   KE L2/3/4 Fem Quad 5 5   DF L4/5 D Peron Tib Ant 5 5   PF S1/2 Tibial Grc/Sol 5 5    Sensation:  Light touch Intact throughout   Pin prick    Temperature    Vibration   Proprioception    Coordination/Complex Motor:  - Finger to Nose intact BL - Heel to shin left leg ataxia. - Rapid alternating movement are intact throughout - Gait: deferred.   Labs   CBC:  Recent Labs  Lab 04/07/23 1256  WBC 10.8*  NEUTROABS 8.4*  HGB 18.7*  HCT 55.6*  MCV 92.4  PLT 162   Basic Metabolic Panel:  Lab Results  Component Value Date   NA 134 (L) 04/07/2023   K 3.9 04/07/2023   CO2 23 04/07/2023   GLUCOSE 177 (H) 04/07/2023   BUN 12 04/07/2023   CREATININE 1.04 04/07/2023   CALCIUM 9.4 04/07/2023   GFRNONAA >60 04/07/2023   GFRAA >60 09/05/2019   Lipid Panel:  Lab Results  Component Value Date   LDLCALC 126 (H) 09/05/2019   HgbA1c:  Lab Results  Component Value Date   HGBA1C 9.6 (  H) 09/04/2019   Urine Drug Screen:     Component Value Date/Time   LABOPIA NONE DETECTED 04/07/2023 1256   COCAINSCRNUR NONE DETECTED 04/07/2023 1256   LABBENZ NONE DETECTED 04/07/2023 1256   AMPHETMU NONE DETECTED 04/07/2023 1256   THCU NONE DETECTED 04/07/2023 1256   LABBARB NONE DETECTED 04/07/2023 1256    Alcohol Level     Component Value Date/Time   ETH <10 04/07/2023 1256   INR  Lab Results  Component Value Date   INR 1.9 (H) 04/07/2023   APTT  Lab Results  Component Value Date   APTT 33 04/07/2023     CT Head without contrast(Personally reviewed): 1. Small age-indeterminate infarct within the right cerebellar hemisphere. 2. Background parenchymal atrophy, chronic small vessel ischemic disease and chronic infarcts.  CT angio Head and Neck with contrast(Personally  reviewed): 1. The common carotid and internal carotid arteries are patent within the neck. Soft plaque and/or a carotid web within the left carotid bulb, resulting in less than 50% stenosis. 2. Venous reflux of contrast limits evaluation of the bilateral vertebral artery V1 and proximal V2 segments. Within this limitation, the cervical vertebral arteries are patent without stenosis or significant atherosclerotic disease.  CTA head:   1. Distal basilar artery occlusion, new from the prior MRA head of 09/05/2019. Additionally, the left posterior cerebral artery is also occluded from its origin. The right posterior cerebral artery is predominantly (or entirely) fetal in origin and patent. 2. 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 3. Background intracranial atherosclerotic disease as described. Most notably, there is an unchanged severe stenosis within the right posterior cerebral artery P2 segment, and there are severe stenoses within the left anterior cerebral artery A3 segment. 4. 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   CT perfusion(Personally reviewed): The perfusion software identifies regions of critically hypoperfused parenchyma within the posterior circulation totaling 42 mL (utilizing the Tmax>6 seconds threshold). No core infarct is identified. Reported mismatch volume: 42 mL.  MRI Brain(Personally reviewed): Pending  Assessment   [name] is a 63 y.o. adult with hx of HLD, HTN, DVT on Xarelto, Burkitts lymphoma, prior L cerebellar stroke with baseline dysequilibrium who went to bed at 2300 and woke up fine this AM around 0730. Around 0815, developed slurred speech. NIHSS of 2 for dysarthria and L leg ataxia.  Came to ED where he was found to have distal basilar artery occlusion with CT perfusion showing hypoperfused parenchyma in posterior circulation totaling 42ml.  He was outside tnkase window on  arrival. He is not a candidate for thrombectomy at present due to his symptoms being too mild to treat. However, plan is to monitor him closely in the Neuro ICU with Q1hour neuro checks for any worsening deficit and if he were to develop disabling deficit, he could be a candidate for thrombectomy.  Primary Diagnosis:  Cerebral infarction due to occlusion or stenosis of basilar artery.   Secondary Diagnosis: Essential (primary) hypertension and Type 2 diabetes mellitus with hyperglycemia   Recommendations  Basilar artery occlusion: - Q1H Neuro checks in the ICU. - MRI Brain without contrast - Activate code stroke for any new deficits or worsening of his deficits. - Recommend obtaining TTE - Recommend obtaining Lipid panel with LDL - Please start statin if LDL > 70 - Recommend HbA1c to evaluate for diabetes and how well it is controlled. - hold Xarelto. Timing of Xarelto based on size of stroke  on MRI brain. - Recommend DVT ppx - SBP goal - 140- 200 systolic. Hold home meds.  - fluids at 165ml/hr. Head of bed flat or below 30 degrees, strict bedrest. - Recommend Telemetry monitoring for arrythmia - Recommend bedside swallow screen prior to PO intake. - Stroke education booklet - Recommend PT/OT/SLP consult    DM2: - Sliding scale insulin - HbA1c is pending.  GERD: - cont PPI.   ______________________________________________________________________  Allergies verified and updated. Code status verified and patient is FULL CODE. Patient would like for his significant other Ms. Lollie Sails and his mother Ms. Babs Sciara to make medical decision for him if he is unable to make decision by himself.   This patient is critically ill and at significant risk of neurological worsening, death and care requires constant monitoring of vital signs, hemodynamics,respiratory and cardiac monitoring, neurological assessment, discussion with family, other specialists and medical decision making  of high complexity. I spent 30 minutes of neurocritical care time  in the care of  this patient. This was time spent independent of any time provided by nurse practitioner or PA.  Erick Blinks [facility] 04/07/2023  4:34 PM   Signed, Erick Blinks, MD [facility]

## 2023-04-08 ENCOUNTER — Inpatient Hospital Stay (HOSPITAL_COMMUNITY)

## 2023-04-08 DIAGNOSIS — I6389 Other cerebral infarction: Secondary | ICD-10-CM

## 2023-04-08 LAB — CBC
HCT: 53.7 % — ABNORMAL HIGH (ref 39.0–52.0)
Hemoglobin: 18 g/dL — ABNORMAL HIGH (ref 13.0–17.0)
MCH: 31.4 pg (ref 26.0–34.0)
MCHC: 33.5 g/dL (ref 30.0–36.0)
MCV: 93.6 fL (ref 80.0–100.0)
Platelets: 150 10*3/uL (ref 150–400)
RBC: 5.74 MIL/uL (ref 4.22–5.81)
RDW: 13.6 % (ref 11.5–15.5)
WBC: 9.3 10*3/uL (ref 4.0–10.5)
nRBC: 0 % (ref 0.0–0.2)

## 2023-04-08 LAB — ECHOCARDIOGRAM COMPLETE
AR max vel: 3.43 cm2
AV Area VTI: 3.02 cm2
AV Area mean vel: 3.05 cm2
AV Mean grad: 3 mmHg
AV Peak grad: 5.1 mmHg
AV Vena cont: 0.7 cm
Ao pk vel: 1.13 m/s
Area-P 1/2: 3.85 cm2
Height: 73 in
P 1/2 time: 624 ms
S' Lateral: 3.4 cm
Weight: 3294.55 [oz_av]

## 2023-04-08 LAB — LIPID PANEL
Cholesterol: 193 mg/dL (ref 0–200)
HDL: 23 mg/dL — ABNORMAL LOW (ref >40)
LDL Cholesterol: 120 mg/dL — ABNORMAL HIGH (ref 0–99)
Total CHOL/HDL Ratio: 8.4 ratio
Triglycerides: 250 mg/dL — ABNORMAL HIGH (ref <150)
VLDL: 50 mg/dL — ABNORMAL HIGH (ref 0–40)

## 2023-04-08 LAB — BASIC METABOLIC PANEL
Anion gap: 8 (ref 5–15)
BUN: 9 mg/dL (ref 8–23)
CO2: 24 mmol/L (ref 22–32)
Calcium: 8.6 mg/dL — ABNORMAL LOW (ref 8.9–10.3)
Chloride: 107 mmol/L (ref 98–111)
Creatinine, Ser: 0.96 mg/dL (ref 0.61–1.24)
GFR, Estimated: >60 mL/min (ref >60)
Glucose, Bld: 147 mg/dL — ABNORMAL HIGH (ref 70–99)
Potassium: 4.1 mmol/L (ref 3.5–5.1)
Sodium: 139 mmol/L (ref 135–145)

## 2023-04-08 LAB — GLUCOSE, CAPILLARY
Glucose-Capillary: 132 mg/dL — ABNORMAL HIGH (ref 70–99)
Glucose-Capillary: 150 mg/dL — ABNORMAL HIGH (ref 70–99)
Glucose-Capillary: 192 mg/dL — ABNORMAL HIGH (ref 70–99)
Glucose-Capillary: 236 mg/dL — ABNORMAL HIGH (ref 70–99)

## 2023-04-08 MED ORDER — RISAQUAD PO CAPS
1.0000 | ORAL_CAPSULE | Freq: Every day | ORAL | Status: DC
Start: 2023-04-08 — End: 2023-04-09
  Administered 2023-04-08 – 2023-04-09 (×2): 1 via ORAL
  Filled 2023-04-08 (×2): qty 1

## 2023-04-08 MED ORDER — ATORVASTATIN CALCIUM 40 MG PO TABS
40.0000 mg | ORAL_TABLET | Freq: Every day | ORAL | Status: DC
  Administered 2023-04-08 – 2023-04-09 (×2): 40 mg via ORAL
  Filled 2023-04-08 (×2): qty 1

## 2023-04-08 MED ORDER — APIXABAN 5 MG PO TABS
5.0000 mg | ORAL_TABLET | Freq: Two times a day (BID) | ORAL | Status: DC
  Administered 2023-04-08 – 2023-04-09 (×2): 5 mg via ORAL
  Filled 2023-04-08 (×2): qty 1

## 2023-04-08 MED ORDER — SODIUM CHLORIDE 0.9 % IV BOLUS
1000.0000 mL | Freq: Once | INTRAVENOUS | Status: AC
  Administered 2023-04-08: 1000 mL via INTRAVENOUS

## 2023-04-08 MED ORDER — EMPAGLIFLOZIN 10 MG PO TABS
10.0000 mg | ORAL_TABLET | Freq: Every day | ORAL | Status: DC
  Administered 2023-04-08 – 2023-04-09 (×2): 10 mg via ORAL
  Filled 2023-04-08 (×2): qty 1

## 2023-04-08 NOTE — Progress Notes (Addendum)
 OT Evaluations;  Clinical Impression: Pt is typically mod I for ADL and mobility. He lives alone with his 2 dogs. He reports falls in the past 6 months (getting on step stool, turning too fast) He drives. He does his own ADL and IADL including cooking/cleaning/financial and medicine management. He states he does have balance deficits from previous CVA - no family present to determine/confirm PLOF. Today Pt bumping into objects on the R (he does have baseline glaucoma) required cues for sequencing during standing grooming and demonstrated decreased activity tolerance. Overall min A for UB and LB ADL, as well as mobility. Recommending post-acute OT at the >3 hour therapy level to maximize safety and independence with ADL and functional transfers. Next session make sure we assess and address low vision compensatory strategies.   04/08/23 0900  OT Visit Information  Last OT Received On 04/08/23  Assistance Needed +1  History of Present Illness The pt is a 63 yo presenting 3/5 with balance deficits and slurred speech. 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 PMH includes: HLD, HTN, DVT on Xarelto, Burkitts lymphoma, and prior L cerebellar stroke with baseline dysequilibrium.  Precautions  Precautions Fall  Restrictions  Weight Bearing Restrictions Per Provider Order No  Home Living  Family/patient expects to be discharged to: Private residence  Living Arrangements Alone (2 dachaunsa)  Available Help at Discharge Family;Available PRN/intermittently  Type of Home House  Home Access Stairs to enter  Entrance Stairs-Number of Steps 4  Entrance Stairs-Rails Right;Left;Can reach both  Home Layout One level  Bathroom Electronics engineer - single point (was his dads)  Prior Function  Prior Level of Function  Independent/Modified  Independent;Driving  Mobility Comments no DME at baseline  Pain Assessment  Pain Assessment No/denies pain  Cognition  Arousal Alert  Behavior During Therapy WFL for tasks assessed/performed  Cognition Cognition impaired  Awareness Intellectual awareness intact;Online awareness impaired  Attention impairment (select first level of impairment) Alternating attention  Executive functioning impairment (select all impairments) Problem solving  OT - Cognition Comments decreased safety awareness, Unsure of Pt's true PLOF. suspect new deficits and Pt unaware  Following Commands  Following commands Intact  Cueing  Cueing Techniques Verbal cues;Gestural cues  Communication  Communication No apparent difficulties  Factors Affecting Communication Reduced clarity of speech  Upper Extremity Assessment  Upper Extremity Assessment Generalized weakness;RUE deficits/detail  RUE Deficits / Details some inattention during gross motor  RUE Sensation decreased proprioception  RUE Coordination decreased gross motor  Lower Extremity Assessment  Lower Extremity Assessment Defer to PT evaluation  Cervical / Trunk Assessment  Cervical / Trunk Assessment Kyphotic  Vision- History  Baseline Vision/History 3 Glaucoma  Patient Visual Report No change from baseline  Vision- Assessment  Vision Assessment? Wears glasses for reading  Additional Comments undergoing treatment for glaucoma  Perception  Perception Impaired  Preception Impairment Details Inattention/Neglect  Perception-Other Comments right inattention  Praxis  Praxis Impaired  Praxis Impairment Details Motor planning  Praxis-Other Comments especially with bed mobility  ADL  Overall ADL's  Needs assistance/impaired  Eating/Feeding NPO  Grooming Wash/dry hands;Wash/dry face;Contact guard assist;Minimal assistance;Cueing for sequencing;Standing  Grooming Details (indicate cue type and reason) needed cues for soap  Upper Body Bathing Set  up;Sitting  Upper Body Bathing Details (indicate cue type and reason) unsafe for standing currently  Lower Body Bathing Contact guard assist;Sitting/lateral leans  Upper Body Dressing  Set up;Sitting  Upper Body Dressing Details (indicate cue type and reason) extra gown like robe  Lower Body Dressing Minimal assistance;Sit to/from stand  Lower Body Dressing Details (indicate cue type and reason) donning socks  Toilet Transfer Minimal assistance;Ambulation  Toileting- Clothing Manipulation and Hygiene Contact guard assist;Sitting/lateral lean  Functional mobility during ADLs Minimal assistance;Cueing for safety (bumping into door frame on R side)  General ADL Comments decreased awareness, balance, safety, cognition  Bed Mobility  Overal bed mobility Needs Assistance  Bed Mobility Sit to Supine  Sit to supine Min assist  General bed mobility comments minA to manage lines, otherwise increased time and cues for sequencing  Transfers  Overall transfer level Needs assistance  Equipment used None  Transfers Sit to/from Stand  Sit to Stand Min assist  General transfer comment min A for balance, Pt declining use of DME - reaches for external support frequently  Balance  Overall balance assessment Needs assistance  Sitting-balance support No upper extremity supported;Feet supported  Sitting balance-Leahy Scale Fair  Standing balance support No upper extremity supported;Single extremity supported  Standing balance-Leahy Scale Fair  Standing balance comment can ambulate without UE suport but poor tolerance for challenge including turns  General Comments  General comments (skin integrity, edema, etc.) VSS on RA throughout session, no family present to determine PLOF  OT - End of Session  Equipment Utilized During Treatment Gait belt  Activity Tolerance Patient tolerated treatment well  Patient left Other (comment) (working with PT in hallway)  Nurse Communication Mobility status  OT  Assessment  OT Recommendation/Assessment Patient needs continued OT Services  OT Visit Diagnosis Unsteadiness on feet (R26.81);Other abnormalities of gait and mobility (R26.89);Muscle weakness (generalized) (M62.81);Other symptoms and signs involving the nervous system (R29.898)  OT Problem List Decreased activity tolerance;Impaired balance (sitting and/or standing);Impaired vision/perception;Decreased cognition;Decreased safety awareness;Decreased knowledge of use of DME or AE  OT Plan  OT Frequency (ACUTE ONLY) Min 2X/week  OT Treatment/Interventions (ACUTE ONLY) Self-care/ADL training;Neuromuscular education;DME and/or AE instruction;Therapeutic activities;Cognitive remediation/compensation;Patient/family education;Balance training  AM-PAC OT "6 Clicks" Daily Activity Outcome Measure (Version 2)  Help from another person eating meals? 1 (NPO)  Help from another person taking care of personal grooming? 3  Help from another person toileting, which includes using toliet, bedpan, or urinal? 3  Help from another person bathing (including washing, rinsing, drying)? 3  Help from another person to put on and taking off regular upper body clothing? 3  Help from another person to put on and taking off regular lower body clothing? 3  6 Click Score 16  Progressive Mobility  What is the highest level of mobility based on the progressive mobility assessment? Level 4 (Walks with assist in room) - Balance while marching in place and cannot step forward and back - Complete  Mobility Referral  (in ICU currently)  Activity Ambulated with assistance to bathroom  OT Recommendation  Recommendations for Other Services Rehab consult;PT consult;Speech consult  Follow Up Recommendations Acute inpatient rehab (3hours/day)  Patient can return home with the following A little help with walking and/or transfers;A little help with bathing/dressing/bathroom;Assistance with cooking/housework;Direct supervision/assist for  medications management;Direct supervision/assist for financial management;Assist for transportation;Help with stairs or ramp for entrance  Functional Status Assessent Patient has had a recent decline in their functional status and demonstrates the ability to make significant improvements in function in a reasonable and predictable amount of time.  OT Equipment Tub/shower seat  Individuals Consulted  Consulted and Agree with Results and Recommendations Patient  Acute Rehab OT Goals  Patient Stated Goal get back home to his 2 dauchounds  OT Goal Formulation With patient  Time For Goal Achievement 04/22/23  Potential to Achieve Goals Good  OT Time Calculation  OT Start Time (ACUTE ONLY) 0922  OT Stop Time (ACUTE ONLY) 9147  OT Time Calculation (min) 15 min  OT General Charges  $OT Visit 1 Visit  OT Evaluation  $OT Eval Moderate Complexity 1 Mod   Nyoka Cowden OTR/L Acute Rehabilitation Services Office: [phone]

## 2023-04-08 NOTE — Progress Notes (Signed)
 PHARMACY - ANTICOAGULATION CONSULT NOTE  Pharmacy Consult for apixaban Indication: DVT  Allergies  Allergen Reactions   Codeine Nausea Only   Hydrocodone-Acetaminophen Other (See Comments)    Makes patient sick   Vicodin [Hydrocodone-Acetaminophen] Nausea And Vomiting    Patient Measurements: Height: 6\' 1"  (185.4 cm) Weight: 93.4 kg (205 lb 14.6 oz) IBW/kg (Calculated) : 79.9   Vital Signs: Temp: 98.8 F (37.1 C) (03/06 1601) Temp Source: Oral (03/06 1601) BP: 163/111 (03/06 1805) Pulse Rate: 74 (03/06 1805)  Labs: Recent Labs    04/07/23 1256 04/08/23 1008  HGB 18.7* 18.0*  HCT 55.6* 53.7*  PLT 162 150  APTT 33  --   LABPROT 22.4*  --   INR 1.9*  --   CREATININE 1.04 0.96    Estimated Creatinine Clearance: 90.2 mL/min (by C-G formula based on SCr of 0.96 mg/dL).   Medical History: Past Medical History:  Diagnosis Date   Abnormal liver function test 08/31/2011   Mild elevation bilirubin, normal/high hemoglobin, normal LDH   Adult ADHD    Aneurysm (HCC) 10/23/2014   4mm left anterior communicating artery region, noted on MRI head   Anxiety    Burkitt's lymphoma (HCC)    followed by Dr. Mancel Bale   Cerebellar infarct [facility]) 10/23/2014   Chronic, noted on MRI head   Chest pain    Depression    DVT (deep venous thrombosis) (HCC) 12/2015   Left leg   Elevated PSA    Glaucoma    Headache    History of double vision 2016   HTN (hypertension), benign 08/31/2011   Hyperlipidemia    Hypotestosteronemia    Polycythemia, secondary 08/31/2011   Likely due to testosterone injections   Stroke [facility])    White matter disease 10/23/2014   noted on MRI head, "extensive"      Assessment: 63 yo M with hx DVT (appears to be in 2017 per chart review) on rivaroxaban now admitted with CVA. Last dose rivaroxaban 3/5 PM. Pharmacy consulted to switch to apixaban per neuro.    Goal of Therapy:  Monitor platelets by anticoagulation protocol: Yes   Plan:  Apixaban  5mg  BID. Monitor signs/symptoms of bleeding   Alphia Moh, PharmD, BCPS, [facility] Clinical Pharmacist  Please check AMION for all [facility] Pharmacy phone numbers After 10:00 PM, call Main Pharmacy [phone]

## 2023-04-08 NOTE — Evaluation (Signed)
 Physical Therapy Evaluation Patient Details Name: [name] MRN: [id number] DOB: 04/22/1960 Today's Date: 04/08/2023  History of Present Illness  The pt is a 63 yo presenting 3/5 with balance deficits and slurred speech. 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 PMH includes: HLD, HTN, DVT on Xarelto, Burkitts lymphoma, and prior L cerebellar stroke with baseline dysequilibrium.   Clinical Impression  Pt mobilizing in room with OT, upon arrival of PT, agreeable to evaluation at this time. Prior to admission the pt was living alone, not using DME, and reports hx of balance deficits from last stroke, but was still driving and only falls once every 3-6 months. The pt presents today with deficits in dynamic stability, especially with head movements, turning, or navigating around obstacles. The pt needed minA with increased cueing to manage around obstacles and minA to correct LOB. He also demos poor safety awareness, needed max cues and assist to manage around lines and ran into various obstacles on R side while ambulating. Given pt's prior independence and increased balance deficits, recommend intensive inpatient therapies, >3hours/day. I anticipate he could return to a much safer level of independence with short bout of intensive therapies.   Dynamic Gait Index (DGI): 9/24 (<19 indicates increased risk for falls)         If plan is discharge home, recommend the following: A little help with walking and/or transfers;A little help with bathing/dressing/bathroom;Assistance with cooking/housework;Direct supervision/assist for medications management;Assistance with feeding;Direct supervision/assist for financial management;Assist for transportation;Supervision due to cognitive status   Can travel by private vehicle        Equipment Recommendations BSC/3in1  Recommendations for Other Services   Rehab consult    Functional Status Assessment Patient has had a recent decline in their functional status and demonstrates the ability to make significant improvements in function in a reasonable and predictable amount of time.     Precautions / Restrictions Precautions Precautions: Fall Recall of Precautions/Restrictions: Impaired Restrictions Weight Bearing Restrictions Per Provider Order: No      Mobility  Bed Mobility Overal bed mobility: Needs Assistance Bed Mobility: Sit to Supine       Sit to supine: Min assist   General bed mobility comments: minA to manage lines, otherwise increased time and cues for sequencing    Transfers Overall transfer level: Needs assistance Equipment used: None Transfers: Sit to/from Stand Sit to Stand: Contact guard assist           General transfer comment: CGA for safety, no overt LOB    Ambulation/Gait Ambulation/Gait assistance: Min assist, Contact guard assist Gait Distance (Feet): 125 Feet Assistive device: None (pt declined) Gait Pattern/deviations: Step-through pattern, Staggering right, Staggering left, Drifts right/left, Narrow base of support Gait velocity: decreased Gait velocity interpretation: <1.8 ft/sec, indicate of risk for recurrent falls   General Gait Details: pt with frequent LOB with challenge needing minA to recover, especially with turning and head movements. Pt with no overt scissoring steps or buckling, decreased awareness to R and ran into various objects of varying heights on R side  Stairs Stairs: Yes Stairs assistance: Contact guard assist Stair Management: One rail Right, Step to pattern, Forwards Number of Stairs: 5 General stair comments: ascends with LLE     Balance Overall balance assessment: Needs assistance, History of Falls Sitting-balance support: No upper extremity supported, Feet supported Sitting balance-Leahy Scale: Fair     Standing balance support: No upper extremity supported,  During  functional activity Standing balance-Leahy Scale: Fair Standing balance comment: can ambulate without UE suport but poor tolerance for challenge                 Standardized Balance Assessment Standardized Balance Assessment : Dynamic Gait Index   Dynamic Gait Index Level Surface: Mild Impairment Change in Gait Speed: Mild Impairment Gait with Horizontal Head Turns: Moderate Impairment Gait with Vertical Head Turns: Moderate Impairment Gait and Pivot Turn: Moderate Impairment Step Over Obstacle: Severe Impairment (minA to steady) Step Around Obstacles: Moderate Impairment Steps: Moderate Impairment Total Score: 9       Pertinent Vitals/Pain Pain Assessment Pain Assessment: No/denies pain    Home Living Family/patient expects to be discharged to:: Private residence Living Arrangements: Alone (2 dachaunsa) Available Help at Discharge: Family;Available PRN/intermittently Type of Home: House Home Access: Stairs to enter Entrance Stairs-Rails: Right;Left;Can reach both Entrance Stairs-Number of Steps: 4   Home Layout: One level Home Equipment: Cane - single point (was his dads)      Prior Function Prior Level of Function : Independent/Modified Independent;Driving             Mobility Comments: no DME at baseline ADLs Comments: independent, drives     Extremity/Trunk Assessment   Upper Extremity Assessment Upper Extremity Assessment: Defer to OT evaluation RUE Deficits / Details: some inattention during gross motor RUE Sensation: decreased proprioception RUE Coordination: decreased gross motor    Lower Extremity Assessment Lower Extremity Assessment: Generalized weakness (good strength to isolated testing)    Cervical / Trunk Assessment Cervical / Trunk Assessment: Kyphotic  Communication   Communication Communication: No apparent difficulties Factors Affecting Communication: Reduced clarity of speech    Cognition Arousal: Alert Behavior  During Therapy: WFL for tasks assessed/performed   PT - Cognitive impairments: Attention, Safety/Judgement                       PT - Cognition Comments: poor insight to deficits, states he is off balance at baseline and had little awareness of increased deficits Following commands: Intact       Cueing Cueing Techniques: Verbal cues, Gestural cues     General Comments General comments (skin integrity, edema, etc.): VSS onRA        Assessment/Plan    PT Assessment Patient needs continued PT services  PT Problem List Decreased activity tolerance;Decreased mobility;Decreased balance;Decreased coordination;Decreased safety awareness       PT Treatment Interventions DME instruction;Gait training;Stair training;Functional mobility training;Therapeutic activities;Balance training;Therapeutic exercise;Neuromuscular re-education    PT Goals (Current goals can be found in the Care Plan section)  Acute Rehab PT Goals Patient Stated Goal: return home PT Goal Formulation: With patient Time For Goal Achievement: 04/22/23 Potential to Achieve Goals: Good    Frequency Min 1X/week        AM-PAC PT "6 Clicks" Mobility  Outcome Measure Help needed turning from your back to your side while in a flat bed without using bedrails?: A Little Help needed moving from lying on your back to sitting on the side of a flat bed without using bedrails?: A Little Help needed moving to and from a bed to a chair (including a wheelchair)?: A Little Help needed standing up from a chair using your arms (e.g., wheelchair or bedside chair)?: A Little Help needed to walk in hospital room?: A Lot Help needed climbing 3-5 steps with a railing? : A Lot 6 Click Score: 16    End of Session Equipment Utilized During  Treatment: Gait belt Activity Tolerance: Patient tolerated treatment well Patient left: in bed;with call bell/phone within reach;with bed alarm set Nurse Communication: Mobility status PT  Visit Diagnosis: Other abnormalities of gait and mobility (R26.89);Repeated falls (R29.6);Muscle weakness (generalized) (M62.81)    Time: 1610-9604 PT Time Calculation (min) (ACUTE ONLY): 17 min   Charges:   PT Evaluation $PT Eval Moderate Complexity: 1 Mod   PT General Charges $$ ACUTE PT VISIT: 1 Visit         Vickki Muff, PT, DPT   Acute Rehabilitation Department Office [phone] Secure Chat Communication Preferred  Ronnie Derby 04/08/2023, 11:31 AM

## 2023-04-08 NOTE — Progress Notes (Signed)
 Inpatient Rehab Admissions Coordinator:   Per PT recommendations pt was screened for CIR by Estill Dooms, PT, DPT.  Workup for acute process negative so far.  Will follow for diagnosis to see if patient is a candidate.   Estill Dooms, PT, DPT Admissions Coordinator [phone] 04/08/23  12:26 PM

## 2023-04-08 NOTE — Progress Notes (Signed)
 STROKE TEAM PROGRESS NOTE   SUBJECTIVE (INTERVAL HISTORY) No family is at the bedside.  Overall [name] condition is stable. Still has dysarthria and mild left facial droop but otherwise intact. Passed swallow. Pending MRI.    OBJECTIVE Temp:  [96.3 F (35.7 C)-99.9 F (37.7 C)] 98.5 F (36.9 C) (03/06 1151) Pulse Rate:  [66-118] 69 (03/06 1027) Cardiac Rhythm: Normal sinus rhythm (03/06 0800) Resp:  [13-24] 20 (03/06 1027) BP: (123-198)/(76-157) 149/87 (03/06 1027) SpO2:  [92 %-99 %] 96 % (03/06 1027) Weight:  [93.4 kg-99.1 kg] 93.4 kg (03/05 2100)  Recent Labs  Lab 04/07/23 1231 04/07/23 1705 04/07/23 2104 04/08/23 0736 04/08/23 1106  GLUCAP 200* 125* 129* 132* 150*   Recent Labs  Lab 04/07/23 1256  NA 134*  K 3.9  CL 101  CO2 23  GLUCOSE 177*  BUN 12  CREATININE 1.04  CALCIUM 9.4   Recent Labs  Lab 04/07/23 1256  AST 24  ALT 23  ALKPHOS 40  BILITOT 1.6*  PROT 8.0  ALBUMIN 4.9   Recent Labs  Lab 04/07/23 1256 04/08/23 1008  WBC 10.8* 9.3  NEUTROABS 8.4*  --   HGB 18.7* 18.0*  HCT 55.6* 53.7*  MCV 92.4 93.6  PLT 162 150   No results for input(s): "CKTOTAL", "CKMB", "CKMBINDEX", "TROPONINI" in the last 168 hours. Recent Labs    04/07/23 1256  LABPROT 22.4*  INR 1.9*   Recent Labs    04/07/23 1256  COLORURINE YELLOW  LABSPEC >1.046*  PHURINE 6.5  GLUCOSEU >1,000*  HGBUR NEGATIVE  BILIRUBINUR NEGATIVE  KETONESUR NEGATIVE  PROTEINUR TRACE*  NITRITE NEGATIVE  LEUKOCYTESUR SMALL*       Component Value Date/Time   CHOL 193 04/08/2023 0551   TRIG 250 (H) 04/08/2023 0551   HDL 23 (L) 04/08/2023 0551   CHOLHDL 8.4 04/08/2023 0551   VLDL 50 (H) 04/08/2023 0551   LDLCALC 120 (H) 04/08/2023 0551   Lab Results  Component Value Date   HGBA1C 9.3 (H) 04/07/2023      Component Value Date/Time   LABOPIA NONE DETECTED 04/07/2023 1256   COCAINSCRNUR NONE DETECTED 04/07/2023 1256   LABBENZ NONE DETECTED 04/07/2023 1256   AMPHETMU NONE  DETECTED 04/07/2023 1256   THCU NONE DETECTED 04/07/2023 1256   LABBARB NONE DETECTED 04/07/2023 1256    Recent Labs  Lab 04/07/23 1256  ETH <10    I have personally reviewed the radiological images below and agree with the radiology interpretations.  CT ANGIO HEAD NECK W WO CM W PERF (CODE STROKE) Result Date: 04/07/2023 CLINICAL DATA:  Provided history: Neuro deficit, acute, stroke suspected. Additional history provided: Balance difficulty, slurred speech, lip tingling. EXAM: CT ANGIOGRAPHY HEAD AND NECK CT PERFUSION BRAIN TECHNIQUE: Multidetector CT imaging of the head and neck was performed using the standard protocol during bolus administration of intravenous contrast. Multiplanar CT image reconstructions and MIPs were obtained to evaluate the vascular anatomy. Carotid stenosis measurements (when applicable) are obtained utilizing NASCET criteria, using the distal internal carotid diameter as the denominator. Multiphase CT imaging of the brain was performed following IV bolus contrast injection. Subsequent parametric perfusion maps were calculated using RAPID software. RADIATION DOSE REDUCTION: This exam was performed according to the departmental dose-optimization program which includes automated exposure control, adjustment of the mA and/or kV according to patient size and/or use of iterative reconstruction technique. CONTRAST:  Administered contrast not known at this time. COMPARISON:  MRI brain and MRA head 09/05/2019. FINDINGS: CT HEAD FINDINGS Brain: Generalized cerebral  and cerebellar atrophy. Chronic lacunar infarct within the left external capsule. Advanced patchy and ill-defined hypoattenuation elsewhere within the cerebral white matter, nonspecific but compatible with chronic small vessel disease. Subcentimeter age-indeterminate infarct within the right cerebellar hemisphere (series 1, image 8). Redemonstrated small chronic infarct within the left cerebellar hemisphere. There is no acute  intracranial hemorrhage. No demarcated cortical infarct. No extra-axial fluid collection. No evidence of an intracranial mass. No midline shift. Vascular: No hyperdense vessel.  Atherosclerotic calcifications. Skull: No calvarial fracture or aggressive osseous lesion. Sinuses/Orbits: No mass or acute finding within the imaged orbits. Mild mucosal thickening within the right maxillary sinus. ASPECTS ([place]ta Stroke Program Early CT Score) - Ganglionic level infarction (caudate, lentiform nuclei, internal capsule, insula, M1-M3 cortex): 7 - Supraganglionic infarction (M4-M6 cortex): 3 Total score (0-10 with 10 being normal): 10 Review of the MIP images confirms the above findings Study findings discussed by telephone at the time of interpretation on 04/07/2023 at 1:20 pm with provider ASHISH ARORA. CTA NECK FINDINGS Aortic arch: Standard aortic branching. The visualized thoracic aorta is normal in caliber. Streak/beam hardening artifact arising from a dense contrast bolus partially obscures the left subclavian artery. Within this limitation, there is no appreciable hemodynamically significant innominate or proximal subclavian artery stenosis. Right carotid system: CCA and ICA patent within the neck without stenosis or significant atherosclerotic disease. Left carotid system: CCA and ICA patent within the neck. Soft plaque and/or carotid web within the carotid bulb resulting in less than 50% stenosis. Vertebral arteries: Venous reflux of contrast limits evaluation of the V1 and proximal V2 vertebral arteries bilaterally. Within this limitation, the vertebral arteries are patent within the neck without stenosis or significant atherosclerotic disease. Skeleton: Levocurvature of the lower cervical and visualized upper thoracic spine. Cervical spondylosis. No acute fracture or aggressive osseous lesion. Other neck: Subcentimeter thyroid nodules not meeting consensus criteria for ultrasound follow-up based on size. No  follow-up imaging recommended. Reference: J Am Coll Radiol. 2015 Feb;12(2): 143-50. Upper chest: Multiple pleural-based right upper lobe pulmonary nodules measuring up to 14 mm. Review of the MIP images confirms the above findings CTA HEAD FINDINGS Anterior circulation: The intracranial internal carotid arteries are patent. Nonstenotic atherosclerotic plaque within the supraclinoid left ICA. The M1 middle cerebral arteries are patent. Atherosclerotic irregularity of the M2 and more distal MCA vessels, bilaterally. No M2 proximal branch occlusion is identified. The anterior cerebral arteries are patent. Atherosclerotic irregularity of both vessels. Most notably, there are sites of severe stenosis within the left anterior cerebral artery A3 segment. 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 Posterior circulation: New from the prior MRA head of 09/05/2019, the intracranial right vertebral artery is occluded beyond the PICA origin. The intracranial left vertebral artery is patent. New from the prior MRA, the distal basilar artery is occluded (just beyond the SCA origins). Enhancement is present within the proximal superior cerebellar arteries bilaterally. The left posterior cerebral artery is occluded from its origin. The left posterior communicating artery is diminutive or absent. The right posterior cerebral artery is predominantly (or entirely) fetal in origin, and patent. Severe stenosis within the right PCA P2 segment, unchanged from the prior MRA. Venous sinuses: Within the limitations of contrast timing, no convincing thrombus. Anatomic variants: As described. Review of the MIP images confirms the above findings CT Brain Perfusion Findings: CBF (<30%) Volume: 0mL Perfusion (Tmax>6.0s) volume: 42mL Mismatch Volume: 42mL Infarction Location:None identified. CTA head Impressions #1 and #2 called by telephone  at the time of interpretation on 04/07/2023 at  1:20 pm to provider [facility], who verbally acknowledged these results. IMPRESSION: Non-contrast head CT: 1. Small age-indeterminate infarct within the right cerebellar hemisphere. 2. Background parenchymal atrophy, chronic small vessel ischemic disease and chronic infarcts. CTA neck: 1. The common carotid and internal carotid arteries are patent within the neck. Soft plaque and/or a carotid web within the left carotid bulb, resulting in less than 50% stenosis. 2. Venous reflux of contrast limits evaluation of the bilateral vertebral artery V1 and proximal V2 segments. Within this limitation, the cervical vertebral arteries are patent without stenosis or significant atherosclerotic disease. 3. Multiple pleural-based right upper lobe pulmonary nodules measuring up to 14 mm. Non-contrast chest CT at 3-6 months is recommended. If the nodules are stable at time of repeat CT, then future CT at 18-24 months (from today's scan) is considered optional for low-risk patients, but is recommended for high-risk patients. This recommendation follows the consensus statement: Guidelines for Management of Incidental Pulmonary Nodules Detected on CT Images: From the Fleischner Society 2017; Radiology 2017; 284:228-243. CTA head: 1. Distal basilar artery occlusion, new from the prior MRA head of 09/05/2019. Additionally, the left posterior cerebral artery is also occluded from its origin. The right posterior cerebral artery is predominantly (or entirely) fetal in origin and patent. 2. 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 3. Background intracranial atherosclerotic disease as described. Most notably, there is an unchanged severe stenosis within the right posterior cerebral artery P2 segment, and there are severe stenoses within the left anterior cerebral artery A3 segment. 4. 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 CT perfusion head: The perfusion software identifies  regions of critically hypoperfused parenchyma within the posterior circulation totaling 42 mL (utilizing the Tmax>6 seconds threshold). No core infarct is identified. Reported mismatch volume: 42 mL. Electronically Signed   By: Jackey Loge D.O.   On: 04/07/2023 14:06   CT CEREBRAL PERFUSION W CONTRAST Result Date: 04/07/2023 CLINICAL DATA:  Provided history: Neuro deficit, acute, stroke suspected. Additional history provided: Balance difficulty, slurred speech, lip tingling. EXAM: CT ANGIOGRAPHY HEAD AND NECK CT PERFUSION BRAIN TECHNIQUE: Multidetector CT imaging of the head and neck was performed using the standard protocol during bolus administration of intravenous contrast. Multiplanar CT image reconstructions and MIPs were obtained to evaluate the vascular anatomy. Carotid stenosis measurements (when applicable) are obtained utilizing NASCET criteria, using the distal internal carotid diameter as the denominator. Multiphase CT imaging of the brain was performed following IV bolus contrast injection. Subsequent parametric perfusion maps were calculated using RAPID software. RADIATION DOSE REDUCTION: This exam was performed according to the departmental dose-optimization program which includes automated exposure control, adjustment of the mA and/or kV according to patient size and/or use of iterative reconstruction technique. CONTRAST:  Administered contrast not known at this time. COMPARISON:  MRI brain and MRA head 09/05/2019. FINDINGS: CT HEAD FINDINGS Brain: Generalized cerebral and cerebellar atrophy. Chronic lacunar infarct within the left external capsule. Advanced patchy and ill-defined hypoattenuation elsewhere within the cerebral white matter, nonspecific but compatible with chronic small vessel disease. Subcentimeter age-indeterminate infarct within the right cerebellar hemisphere (series 1, image 8). Redemonstrated small chronic infarct within the left cerebellar hemisphere. There is no acute  intracranial hemorrhage. No demarcated cortical infarct. No extra-axial fluid collection. No evidence of an intracranial mass. No midline shift. Vascular: No hyperdense vessel.  Atherosclerotic calcifications. Skull: No calvarial fracture or aggressive osseous lesion. Sinuses/Orbits:  No mass or acute finding within the imaged orbits. Mild mucosal thickening within the right maxillary sinus. ASPECTS ([place]berta Stroke Program Early CT Score) - Ganglionic level infarction (caudate, lentiform nuclei, internal capsule, insula, M1-M3 cortex): 7 - Supraganglionic infarction (M4-M6 cortex): 3 Total score (0-10 with 10 being normal): 10 Review of the MIP images confirms the above findings Study findings discussed by telephone at the time of interpretation on 04/07/2023 at 1:20 pm with provider ASHISH ARORA. CTA NECK FINDINGS Aortic arch: Standard aortic branching. The visualized thoracic aorta is normal in caliber. Streak/beam hardening artifact arising from a dense contrast bolus partially obscures the left subclavian artery. Within this limitation, there is no appreciable hemodynamically significant innominate or proximal subclavian artery stenosis. Right carotid system: CCA and ICA patent within the neck without stenosis or significant atherosclerotic disease. Left carotid system: CCA and ICA patent within the neck. Soft plaque and/or carotid web within the carotid bulb resulting in less than 50% stenosis. Vertebral arteries: Venous reflux of contrast limits evaluation of the V1 and proximal V2 vertebral arteries bilaterally. Within this limitation, the vertebral arteries are patent within the neck without stenosis or significant atherosclerotic disease. Skeleton: Levocurvature of the lower cervical and visualized upper thoracic spine. Cervical spondylosis. No acute fracture or aggressive osseous lesion. Other neck: Subcentimeter thyroid nodules not meeting consensus criteria for ultrasound follow-up based on size. No  follow-up imaging recommended. Reference: J Am Coll Radiol. 2015 Feb;12(2): 143-50. Upper chest: Multiple pleural-based right upper lobe pulmonary nodules measuring up to 14 mm. Review of the MIP images confirms the above findings CTA HEAD FINDINGS Anterior circulation: The intracranial internal carotid arteries are patent. Nonstenotic atherosclerotic plaque within the supraclinoid left ICA. The M1 middle cerebral arteries are patent. Atherosclerotic irregularity of the M2 and more distal MCA vessels, bilaterally. No M2 proximal branch occlusion is identified. The anterior cerebral arteries are patent. Atherosclerotic irregularity of both vessels. Most notably, there are sites of severe stenosis within the left anterior cerebral artery A3 segment. 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 Posterior circulation: New from the prior MRA head of 09/05/2019, the intracranial right vertebral artery is occluded beyond the PICA origin. The intracranial left vertebral artery is patent. New from the prior MRA, the distal basilar artery is occluded (just beyond the SCA origins). Enhancement is present within the proximal superior cerebellar arteries bilaterally. The left posterior cerebral artery is occluded from its origin. The left posterior communicating artery is diminutive or absent. The right posterior cerebral artery is predominantly (or entirely) fetal in origin, and patent. Severe stenosis within the right PCA P2 segment, unchanged from the prior MRA. Venous sinuses: Within the limitations of contrast timing, no convincing thrombus. Anatomic variants: As described. Review of the MIP images confirms the above findings CT Brain Perfusion Findings: CBF (<30%) Volume: 0mL Perfusion (Tmax>6.0s) volume: 42mL Mismatch Volume: 42mL Infarction Location:None identified. CTA head Impressions #1 and #2 called by telephone at the time of interpretation on 04/07/2023 at  1:20 pm to provider [facility], who verbally acknowledged these results. IMPRESSION: Non-contrast head CT: 1. Small age-indeterminate infarct within the right cerebellar hemisphere. 2. Background parenchymal atrophy, chronic small vessel ischemic disease and chronic infarcts. CTA neck: 1. The common carotid and internal carotid arteries are patent within the neck. Soft plaque and/or a carotid web within the left carotid bulb, resulting in less than 50% stenosis. 2. Venous reflux of contrast limits evaluation of the bilateral vertebral artery V1 and  proximal V2 segments. Within this limitation, the cervical vertebral arteries are patent without stenosis or significant atherosclerotic disease. 3. Multiple pleural-based right upper lobe pulmonary nodules measuring up to 14 mm. Non-contrast chest CT at 3-6 months is recommended. If the nodules are stable at time of repeat CT, then future CT at 18-24 months (from today's scan) is considered optional for low-risk patients, but is recommended for high-risk patients. This recommendation follows the consensus statement: Guidelines for Management of Incidental Pulmonary Nodules Detected on CT Images: From the Fleischner Society 2017; Radiology 2017; 284:228-243. CTA head: 1. Distal basilar artery occlusion, new from the prior MRA head of 09/05/2019. Additionally, the left posterior cerebral artery is also occluded from its origin. The right posterior cerebral artery is predominantly (or entirely) fetal in origin and patent. 2. 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 3. Background intracranial atherosclerotic disease as described. Most notably, there is an unchanged severe stenosis within the right posterior cerebral artery P2 segment, and there are severe stenoses within the left anterior cerebral artery A3 segment. 4. 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 CT perfusion head: The perfusion software identifies  regions of critically hypoperfused parenchyma within the posterior circulation totaling 42 mL (utilizing the Tmax>6 seconds threshold). No core infarct is identified. Reported mismatch volume: 42 mL. Electronically Signed   By: Jackey Loge D.O.   On: 04/07/2023 14:06   CT HEAD CODE STROKE WO CONTRAST Result Date: 04/07/2023 CLINICAL DATA:  Provided history: Neuro deficit, acute, stroke suspected. Additional history provided: Balance difficulty, slurred speech, lip tingling. EXAM: CT ANGIOGRAPHY HEAD AND NECK CT PERFUSION BRAIN TECHNIQUE: Multidetector CT imaging of the head and neck was performed using the standard protocol during bolus administration of intravenous contrast. Multiplanar CT image reconstructions and MIPs were obtained to evaluate the vascular anatomy. Carotid stenosis measurements (when applicable) are obtained utilizing NASCET criteria, using the distal internal carotid diameter as the denominator. Multiphase CT imaging of the brain was performed following IV bolus contrast injection. Subsequent parametric perfusion maps were calculated using RAPID software. RADIATION DOSE REDUCTION: This exam was performed according to the departmental dose-optimization program which includes automated exposure control, adjustment of the mA and/or kV according to patient size and/or use of iterative reconstruction technique. CONTRAST:  Administered contrast not known at this time. COMPARISON:  MRI brain and MRA head 09/05/2019. FINDINGS: CT HEAD FINDINGS Brain: Generalized cerebral and cerebellar atrophy. Chronic lacunar infarct within the left external capsule. Advanced patchy and ill-defined hypoattenuation elsewhere within the cerebral white matter, nonspecific but compatible with chronic small vessel disease. Subcentimeter age-indeterminate infarct within the right cerebellar hemisphere (series 1, image 8). Redemonstrated small chronic infarct within the left cerebellar hemisphere. There is no acute  intracranial hemorrhage. No demarcated cortical infarct. No extra-axial fluid collection. No evidence of an intracranial mass. No midline shift. Vascular: No hyperdense vessel.  Atherosclerotic calcifications. Skull: No calvarial fracture or aggressive osseous lesion. Sinuses/Orbits: No mass or acute finding within the imaged orbits. Mild mucosal thickening within the right maxillary sinus. ASPECTS ([place] Stroke Program Early CT Score) - Ganglionic level infarction (caudate, lentiform nuclei, internal capsule, insula, M1-M3 cortex): 7 - Supraganglionic infarction (M4-M6 cortex): 3 Total score (0-10 with 10 being normal): 10 Review of the MIP images confirms the above findings Study findings discussed by telephone at the time of interpretation on 04/07/2023 at 1:20 pm with provider ASHISH ARORA. CTA NECK FINDINGS Aortic arch: Standard aortic branching. The visualized thoracic  aorta is normal in caliber. Streak/beam hardening artifact arising from a dense contrast bolus partially obscures the left subclavian artery. Within this limitation, there is no appreciable hemodynamically significant innominate or proximal subclavian artery stenosis. Right carotid system: CCA and ICA patent within the neck without stenosis or significant atherosclerotic disease. Left carotid system: CCA and ICA patent within the neck. Soft plaque and/or carotid web within the carotid bulb resulting in less than 50% stenosis. Vertebral arteries: Venous reflux of contrast limits evaluation of the V1 and proximal V2 vertebral arteries bilaterally. Within this limitation, the vertebral arteries are patent within the neck without stenosis or significant atherosclerotic disease. Skeleton: Levocurvature of the lower cervical and visualized upper thoracic spine. Cervical spondylosis. No acute fracture or aggressive osseous lesion. Other neck: Subcentimeter thyroid nodules not meeting consensus criteria for ultrasound follow-up based on size. No  follow-up imaging recommended. Reference: J Am Coll Radiol. 2015 Feb;12(2): 143-50. Upper chest: Multiple pleural-based right upper lobe pulmonary nodules measuring up to 14 mm. Review of the MIP images confirms the above findings CTA HEAD FINDINGS Anterior circulation: The intracranial internal carotid arteries are patent. Nonstenotic atherosclerotic plaque within the supraclinoid left ICA. The M1 middle cerebral arteries are patent. Atherosclerotic irregularity of the M2 and more distal MCA vessels, bilaterally. No M2 proximal branch occlusion is identified. The anterior cerebral arteries are patent. Atherosclerotic irregularity of both vessels. Most notably, there are sites of severe stenosis within the left anterior cerebral artery A3 segment. 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 Posterior circulation: New from the prior MRA head of 09/05/2019, the intracranial right vertebral artery is occluded beyond the PICA origin. The intracranial left vertebral artery is patent. New from the prior MRA, the distal basilar artery is occluded (just beyond the SCA origins). Enhancement is present within the proximal superior cerebellar arteries bilaterally. The left posterior cerebral artery is occluded from its origin. The left posterior communicating artery is diminutive or absent. The right posterior cerebral artery is predominantly (or entirely) fetal in origin, and patent. Severe stenosis within the right PCA P2 segment, unchanged from the prior MRA. Venous sinuses: Within the limitations of contrast timing, no convincing thrombus. Anatomic variants: As described. Review of the MIP images confirms the above findings CT Brain Perfusion Findings: CBF (<30%) Volume: 0mL Perfusion (Tmax>6.0s) volume: 42mL Mismatch Volume: 42mL Infarction Location:None identified. CTA head Impressions #1 and #2 called by telephone at the time of interpretation on 04/07/2023 at  1:20 pm to provider [facility], who verbally acknowledged these results. IMPRESSION: Non-contrast head CT: 1. Small age-indeterminate infarct within the right cerebellar hemisphere. 2. Background parenchymal atrophy, chronic small vessel ischemic disease and chronic infarcts. CTA neck: 1. The common carotid and internal carotid arteries are patent within the neck. Soft plaque and/or a carotid web within the left carotid bulb, resulting in less than 50% stenosis. 2. Venous reflux of contrast limits evaluation of the bilateral vertebral artery V1 and proximal V2 segments. Within this limitation, the cervical vertebral arteries are patent without stenosis or significant atherosclerotic disease. 3. Multiple pleural-based right upper lobe pulmonary nodules measuring up to 14 mm. Non-contrast chest CT at 3-6 months is recommended. If the nodules are stable at time of repeat CT, then future CT at 18-24 months (from today's scan) is considered optional for low-risk patients, but is recommended for high-risk patients. This recommendation follows the consensus statement: Guidelines for Management of Incidental Pulmonary Nodules Detected on CT Images: From the Fleischner  Society 2017; Radiology 2017; [phone]. CTA head: 1. Distal basilar artery occlusion, new from the prior MRA head of 09/05/2019. Additionally, the left posterior cerebral artery is also occluded from its origin. The right posterior cerebral artery is predominantly (or entirely) fetal in origin and patent. 2. 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 3. Background intracranial atherosclerotic disease as described. Most notably, there is an unchanged severe stenosis within the right posterior cerebral artery P2 segment, and there are severe stenoses within the left anterior cerebral artery A3 segment. 4. 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 CT perfusion head: The perfusion software identifies  regions of critically hypoperfused parenchyma within the posterior circulation totaling 42 mL (utilizing the Tmax>6 seconds threshold). No core infarct is identified. Reported mismatch volume: 42 mL. Electronically Signed   By: Jackey Loge D.O.   On: 04/07/2023 14:06     PHYSICAL EXAM  Temp:  [96.3 F (35.7 C)-99.9 F (37.7 C)] 98.5 F (36.9 C) (03/06 1151) Pulse Rate:  [66-118] 69 (03/06 1027) Resp:  [13-24] 20 (03/06 1027) BP: (123-198)/(76-157) 149/87 (03/06 1027) SpO2:  [92 %-99 %] 96 % (03/06 1027) Weight:  [93.4 kg-99.1 kg] 93.4 kg (03/05 2100)  General - Well nourished, well developed, in no apparent distress.  Ophthalmologic - fundi not visualized due to noncooperation.  Cardiovascular - Regular rhythm and rate.  Mental Status -  Level of arousal and orientation to time, place, and person were intact. Language including expression, naming, repetition, comprehension was assessed and found intact. Moderate to severe dysarthria Fund of Knowledge was assessed and was intact.  Cranial Nerves II - XII - II - Visual field intact OU. III, IV, VI - Extraocular movements intact. V - Facial sensation intact bilaterally. VII - mild left facial droop VIII - Hearing & vestibular intact bilaterally. X - Palate elevates symmetrically. Moderate to severe dysarthria XI - Chin turning & shoulder shrug intact bilaterally. XII - Tongue protrusion intact.  Motor Strength - The patient's strength was normal in all extremities and pronator drift was absent.  Bulk was normal and fasciculations were absent   Motor Tone - Muscle tone was assessed at the neck and appendages and was normal.  Reflexes - The patient's reflexes were symmetrical in all extremities and he had no pathological reflexes.  Sensory - Light touch, temperature/pinprick were assessed and were symmetrical.    Coordination - The patient had normal movements in the hands and feet with no ataxia or dysmetria.  Tremor was  absent.  Gait and Station - deferred.   ASSESSMENT/PLAN [name] is a 63 y.o. male with history of hypertension, hyperlipidemia, recurrent DVT on Xarelto, lymphoma, strokes admitted for slurred speech. No TNK given due to also multiple.    Stroke:  left CR infarct likely secondary to small vessel disease source CT old right cerebellar infarct CTA head and neck left ICA atherosclerosis, distal BA occlusion, left PCA occlusion, right fetal PCA.  Right V4 occlusion, right P2 and left A3 severe stenosis.  4 mm left A1/A2 aneurysm. CTP 0/42 cc MRI left CR infarct MRA pending 2D Echo EF 55 to 60% LDL 120 HgbA1c 9.3 UDS negative Eliquis for VTE prophylaxis aspirin 81 mg daily and Xarelto (rivaroxaban) daily prior to admission, now on aspirin 81 mg daily and Eliquis (apixaban) daily.  Patient counseled to be compliant with [name] antithrombotic medications Ongoing aggressive stroke risk factor management Therapy recommendations: CIR Disposition: Pending  History of stroke 10/2014 admitted for diplopia.  MRI negative.  MRI showed 4 mm ACOM aneurysm 09/2019 admitted for left-sided ataxia, unsteady gait.  MRI showed right AchA infarct, old left cerebellum, left SO, CR, subinsular infarcts.  Has residual unsteady gait 08/2020 follow-up with Dr.Willis's at [facility] for left upper quadrantanopsia, found to be residual deficit from 09/2019 AchA infarct  Posterior circulation atherosclerosis CTA head and neck showed distal BA occlusion, left PCA occlusion, right fetal PCA.  Right V4 occlusion, right P2 severe stenosis.  No acute infarct at posterior circulation at this time. Likely chronic changes.  Diabetes HgbA1c 9.3 goal < 7.0 Uncontrolled Currently on Jardiance CBG monitoring SSI Diabetic coordinator consult placed DM education and close PCP follow up  Hypertension Stable on the high end Permissive hypertension (OK if <220/120) for 24-48 hours post stroke and then gradually normalized  within 5-7 days. Avoid low BP given basilar artery distal occlusion Long term BP goal 1 30-1 50  Hyperlipidemia Home meds: None LDL 128, goal < 70 Now on Lipitor 40 Continue statin at discharge  Other Stroke Risk Factors Recurrent DVT on Xarelto, switch to Eliquis  Other Active Problems Lymphoma  Hospital day # 1  This patient is critically ill due to stroke, basilar artery occlusion, hypoglycemia and at significant risk of neurological worsening, death form recurrent stroke, hemorrhagic admission, DKA. This patient's care requires constant monitoring of vital signs, hemodynamics, respiratory and cardiac monitoring, review of multiple databases, neurological assessment, discussion with family, other specialists and medical decision making of high complexity. I spent 35 minutes of neurocritical care time in the care of this patient.    Marvel Plan, MD PhD Stroke Neurology 04/08/2023 12:16 PM    To contact Stroke Continuity provider, please refer to [email or web address]. After hours, contact General Neurology

## 2023-04-08 NOTE — Progress Notes (Signed)
 Patient's chart had gender identity as male. Patient stated that this is incorrect and he identifies as male. RN updated chart.

## 2023-04-08 NOTE — Discharge Instructions (Addendum)

## 2023-04-08 NOTE — TOC Initial Note (Signed)
 Transition of Care [facility]) - Initial/Assessment Note    Patient Details  Name: [name] MRN: [id number] Date of Birth: October 27, 1960  [facility]) CM/SW Contact:    Lamonte Sakai, Student-Social Work Phone Number: 04/08/2023, 9:25 AM  Clinical Narrative:                  Pt admitted from home-undergoing stroke workup. No current TOC needs identified, please consult as needs arise.       Patient Goals and CMS Choice            Expected Discharge Plan and Services       Living arrangements for the past 2 months: Single Family Home                                      Prior Living Arrangements/Services Living arrangements for the past 2 months: Single Family Home Lives with:: Significant Other                   Activities of Daily Living   ADL Screening (condition at time of admission) Independently performs ADLs?: Yes (appropriate for developmental age) Is the patient deaf or have difficulty hearing?: No Does the patient have difficulty seeing, even when wearing glasses/contacts?: No Does the patient have difficulty concentrating, remembering, or making decisions?: No  Permission Sought/Granted                  Emotional Assessment       Orientation: : Oriented to Self, Oriented to Place, Oriented to  Time, Oriented to Situation      Admission diagnosis:  Basilar artery stenosis [I65.1] Acute ischemic stroke [facility]) [I63.9] Patient Active Problem List   Diagnosis Date Noted   Basilar artery stenosis 04/07/2023   Diabetes mellitus (HCC) 09/05/2019   Acute lower UTI 09/05/2019   Ataxia 09/04/2019   Acute cystitis with hematuria    Hyperlipidemia 08/21/2014   Chest pain 08/21/2014   Bronchitis 05/08/2014   Candida rash of groin 05/08/2014   HTN (hypertension), benign 08/31/2011   Polycythemia, secondary 08/31/2011   Abnormal liver function test 08/31/2011   Burkitt's lymphoma (HCC) 02/23/2011   PCP:  Alysia Penna,  MD Pharmacy:   [id number] [place],  - [address] [place] [address] Phone: [phone] Fax: [phone]     Social Drivers of Health (SDOH) Social History: SDOH Screenings   Food Insecurity: No Food Insecurity (04/07/2023)  Housing: Low Risk  (04/07/2023)  Transportation Needs: No Transportation Needs (04/07/2023)  Utilities: Not At Risk (04/07/2023)  Tobacco Use: Low Risk  (04/07/2023)   SDOH Interventions:     Readmission Risk Interventions     No data to display

## 2023-04-08 NOTE — Progress Notes (Signed)
*  PRELIMINARY RESULTS* Echocardiogram 2D Echocardiogram has been performed.  [name] [name] 04/08/2023, 12:22 PM

## 2023-04-09 ENCOUNTER — Telehealth (HOSPITAL_COMMUNITY): Payer: Self-pay | Admitting: Pharmacy Technician

## 2023-04-09 ENCOUNTER — Other Ambulatory Visit (HOSPITAL_COMMUNITY): Payer: Self-pay

## 2023-04-09 DIAGNOSIS — Z86718 Personal history of other venous thrombosis and embolism: Secondary | ICD-10-CM

## 2023-04-09 DIAGNOSIS — Z794 Long term (current) use of insulin: Secondary | ICD-10-CM

## 2023-04-09 DIAGNOSIS — I639 Cerebral infarction, unspecified: Secondary | ICD-10-CM | POA: Diagnosis not present

## 2023-04-09 DIAGNOSIS — I651 Occlusion and stenosis of basilar artery: Secondary | ICD-10-CM | POA: Diagnosis not present

## 2023-04-09 DIAGNOSIS — E785 Hyperlipidemia, unspecified: Secondary | ICD-10-CM | POA: Diagnosis not present

## 2023-04-09 DIAGNOSIS — E1169 Type 2 diabetes mellitus with other specified complication: Secondary | ICD-10-CM

## 2023-04-09 DIAGNOSIS — I10 Essential (primary) hypertension: Secondary | ICD-10-CM

## 2023-04-09 LAB — BASIC METABOLIC PANEL
Anion gap: 6 (ref 5–15)
BUN: 13 mg/dL (ref 8–23)
CO2: 22 mmol/L (ref 22–32)
Calcium: 8.8 mg/dL — ABNORMAL LOW (ref 8.9–10.3)
Chloride: 109 mmol/L (ref 98–111)
Creatinine, Ser: 0.97 mg/dL (ref 0.61–1.24)
GFR, Estimated: >60 mL/min (ref >60)
Glucose, Bld: 138 mg/dL — ABNORMAL HIGH (ref 70–99)
Potassium: 3.8 mmol/L (ref 3.5–5.1)
Sodium: 137 mmol/L (ref 135–145)

## 2023-04-09 LAB — GLUCOSE, CAPILLARY
Glucose-Capillary: 148 mg/dL — ABNORMAL HIGH (ref 70–99)
Glucose-Capillary: 223 mg/dL — ABNORMAL HIGH (ref 70–99)
Glucose-Capillary: 166 mg/dL — ABNORMAL HIGH (ref 70–99)

## 2023-04-09 LAB — CBC
HCT: 51.8 % (ref 39.0–52.0)
Hemoglobin: 17 g/dL (ref 13.0–17.0)
MCH: 30.2 pg (ref 26.0–34.0)
MCHC: 32.8 g/dL (ref 30.0–36.0)
MCV: 92.2 fL (ref 80.0–100.0)
Platelets: 135 10*3/uL — ABNORMAL LOW (ref 150–400)
RBC: 5.62 MIL/uL (ref 4.22–5.81)
RDW: 13.7 % (ref 11.5–15.5)
WBC: 8.3 10*3/uL (ref 4.0–10.5)
nRBC: 0 % (ref 0.0–0.2)

## 2023-04-09 MED ORDER — BLOOD GLUCOSE TEST VI STRP
1.0000 | ORAL_STRIP | Freq: Three times a day (TID) | 0 refills | Status: AC
  Filled 2023-04-09: qty 100, 30d supply, fill #0

## 2023-04-09 MED ORDER — LANCET DEVICE MISC
1.0000 | Freq: Three times a day (TID) | 0 refills | Status: AC
  Filled 2023-04-09: qty 1, fill #0

## 2023-04-09 MED ORDER — INSULIN LISPRO (1 UNIT DIAL) 100 UNIT/ML (KWIKPEN)
0.0000 [IU] | PEN_INJECTOR | Freq: Three times a day (TID) | SUBCUTANEOUS | 0 refills | Status: DC
Start: 2023-04-09 — End: 2023-05-11
  Filled 2023-04-09: qty 15, 30d supply, fill #0

## 2023-04-09 MED ORDER — APIXABAN 5 MG PO TABS
5.0000 mg | ORAL_TABLET | Freq: Two times a day (BID) | ORAL | 0 refills | Status: AC
Start: 2023-04-09 — End: ?
  Filled 2023-04-09: qty 60, 30d supply, fill #0

## 2023-04-09 MED ORDER — LINAGLIPTIN 5 MG PO TABS: 5.0000 mg | ORAL_TABLET | Freq: Every day | ORAL | Status: DC

## 2023-04-09 MED ORDER — ATORVASTATIN CALCIUM 40 MG PO TABS
40.0000 mg | ORAL_TABLET | Freq: Every day | ORAL | 0 refills | Status: AC
  Filled 2023-04-09: qty 30, 30d supply, fill #0

## 2023-04-09 MED ORDER — ONETOUCH DELICA PLUS LANCET33G MISC
1.0000 | Freq: Three times a day (TID) | 0 refills | Status: AC
Start: 2023-04-09 — End: ?
  Filled 2023-04-09: qty 100, 30d supply, fill #0

## 2023-04-09 MED ORDER — INSULIN ASPART 100 UNIT/ML IJ SOLN: 4.0000 [IU] | Freq: Three times a day (TID) | INTRAMUSCULAR | Status: DC

## 2023-04-09 MED ORDER — BLOOD GLUCOSE MONITOR SYSTEM W/DEVICE KIT
1.0000 | PACK | Freq: Three times a day (TID) | 0 refills | Status: AC
  Filled 2023-04-09: qty 1, 30d supply, fill #0

## 2023-04-09 MED ORDER — INSULIN PEN NEEDLE 32G X 4 MM MISC
1.0000 | Freq: Three times a day (TID) | 0 refills | Status: AC
  Filled 2023-04-09: qty 100, 30d supply, fill #0

## 2023-04-09 NOTE — Inpatient Diabetes Management (Addendum)
 Inpatient Diabetes Program Recommendations  AACE/ADA: New Consensus Statement on Inpatient Glycemic Control (2015)  Target Ranges:  Prepandial:   less than 140 mg/dL      Peak postprandial:   less than 180 mg/dL (1-2 hours)      Critically ill patients:  140 - 180 mg/dL   Lab Results  Component Value Date   GLUCAP 148 (H) 04/09/2023   HGBA1C 9.3 (H) 04/07/2023    Review of Glycemic Control  Latest Reference Range & Units 04/08/23 07:36 04/08/23 11:06 04/08/23 15:21 04/08/23 21:52 04/09/23 07:06  Glucose-Capillary 70 - 99 mg/dL 161 (H) 096 (H) 045 (H) 236 (H) 148 (H)   Diabetes history: DM 2 Outpatient Diabetes medications: Tradjenta 5 mg Daily, Jardiance 10 mg Daily Current orders for Inpatient glycemic control:  Jardiance 10 mg Daily Novolog 0-15 units tid + hs  A1c 9.3% on 3/5  Note glucose trends increase slightly after meal intake  -   Start Novolog 4 units tid meal coverage if eating >50% of meals -   d/c all oral DM medications  Addendum 1:45 pm: Spoke with pt and wife at bedside. Pt reports side effects to Tradjenta and Januvia. Pt desires insulin. Pt has been on IM injections in the past via vial and syringe. I showed pt the insulin pen. Pt desires the insulin pen. Will get pharmacy to run benefits check on which rapid acting insulin is covered under insurance.  I spoke with pt regarding my observation on his glucose trends increasing after meal intake and his fasting glucose are good. Pt is agreeable with meal coverage insulin before the meals to prevent postprandial spikes.   Humalog kwikpen covered $0 copay  Thanks,  Christena Deem RN, MSN, BC-ADM Inpatient Diabetes Coordinator Team Pager [phone] (8a-5p)

## 2023-04-09 NOTE — TOC Progression Note (Addendum)
 Transition of Care [facility]) - Progression Note    Patient Details  Name: [name] MRN: [id number] Date of Birth: [birth year]  Transition of Care [facility]) CM/SW Contact  Leone Haven, RN Phone Number: 04/09/2023, 2:41 PM  Clinical Narrative:    Patient will need HH speech, he does not have transportation for OP therapy.  NCM offered choice, he does not have a preference.   Per Marylene Land with [facility] she had to refer out to [facility] , they are able to take with soc on Wed next week.  She spoke with Clifton Custard.    Expected Discharge Plan: IP Rehab Facility    Expected Discharge Plan and Services       Living arrangements for the past 2 months: Single Family Home                                       Social Determinants of Health (SDOH) Interventions SDOH Screenings   Food Insecurity: No Food Insecurity (04/07/2023)  Housing: Low Risk  (04/07/2023)  Transportation Needs: No Transportation Needs (04/07/2023)  Utilities: Not At Risk (04/07/2023)  Tobacco Use: Low Risk  (04/07/2023)    Readmission Risk Interventions     No data to display

## 2023-04-09 NOTE — Telephone Encounter (Addendum)
 Patient Product/process development scientist completed.    The patient is insured through [facility]. Patient has [employer], may use a copay card, and/or apply for patient assistance if available.    Ran test claim for Eliquis 5 mg and the current 30 day co-pay is $35.00.  Ran test claim for Humalog Kwikpen and the current 30 day co-pay is $0.00.  This test claim was processed through [facility]- copay amounts may vary at other pharmacies due to pharmacy/plan contracts, or as the patient moves through the different stages of their insurance plan.     Roland Earl, CPHT Pharmacy Technician III Certified Patient Advocate [facility] Patient Advocate Team Direct Number: [phone]  Fax: [phone]

## 2023-04-09 NOTE — Consult Note (Addendum)
 Physical Medicine and Rehabilitation Consult Reason for Consult: Rehab Referring Physician: Dr. Roda Shutters   HPI: [name] is a 63 y.o. male with past medical history of hypertension, hyperlipidemia, DVT on Xarelto, Burkitt's lymphoma, left cerebellar CVA with chronic disequilibrium, depression: Developed slurred speech when he woke up on 3/5.  CT head showed age-indeterminate infarct right cerebellar hemisphere.  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  He was on Xarelto and aspirin prior to admission now he is on aspirin and Eliquis daily.  Neurology recommends avoiding low blood pressure due to basilar artery distal occlusion with BP goal 130-150.  He has been started on Lipitor 40.  He is on regular diet.  Patient seen by PT and OT and SLP and found to have difficulty with loss of balance and safety and communication.   Patient insists his ambulation is at baseline level.  He was able to walk 125 feet with min assist/contact-guard assist.  Lives in 1 story home 2 STE. Family and friends can assist 2/47 after discharge.    Review of Systems  Constitutional:  Negative for chills, fever and weight loss.  HENT:  Negative for congestion.   Eyes:  Negative for double vision.  Respiratory:  Negative for cough.   Cardiovascular:  Negative for chest pain.  Gastrointestinal:  Negative for abdominal pain, constipation, diarrhea, nausea and vomiting.  Genitourinary: Negative.   Musculoskeletal:  Positive for joint pain. Negative for myalgias.  Skin:  Negative for rash.  Neurological:  Positive for speech change. Negative for dizziness, sensory change, focal weakness, weakness and headaches.  Psychiatric/Behavioral:  Negative for depression.    Past Medical History:  Diagnosis Date   Abnormal liver function  test 08/31/2011   Mild elevation bilirubin, normal/high hemoglobin, normal LDH   Adult ADHD    Aneurysm (HCC) 10/23/2014   4mm left anterior communicating artery region, noted on MRI head   Anxiety    Burkitt's lymphoma (HCC)    followed by Dr. Mancel Bale   Cerebellar infarct [facility]) 10/23/2014   Chronic, noted on MRI head   Chest pain    Depression    DVT (deep venous thrombosis) (HCC) 12/2015   Left leg   Elevated PSA    Glaucoma    Headache    History of double vision 2016   HTN (hypertension), benign 08/31/2011   Hyperlipidemia    Hypotestosteronemia    Polycythemia, secondary 08/31/2011   Likely due to testosterone injections   Stroke (HCC)    White matter disease 10/23/2014   noted on MRI head, "extensive"   Past Surgical History:  Procedure Laterality Date   CEREBRAL ANGIOGRAM  11/23/2014   HERNIA REPAIR     KNEE SURGERY Left    SHOULDER SURGERY     Family History  Problem Relation Age of Onset   Stroke Father        Deceased   Hypertension Mother        Living   Social History:  reports that he has never smoked. He has never used smokeless tobacco. He reports current alcohol use. He reports that he does not use drugs. Allergies:  Allergies  Allergen Reactions   Codeine Nausea Only   Hydrocodone-Acetaminophen Other (See Comments)    Makes patient sick   Vicodin [Hydrocodone-Acetaminophen] Nausea And Vomiting  Medications Prior to Admission  Medication Sig Dispense Refill   aspirin EC 81 MG tablet Take 81 mg by mouth daily. Swallow whole.     Continuous Glucose Sensor (DEXCOM G7 SENSOR) MISC Change every 10 days to monitor blood glucose continuously     dextroamphetamine (DEXTROSTAT) 5 MG tablet Take 5 mg by mouth 3 (three) times daily.     dorzolamide-timolol (COSOPT) 22.3-6.8 MG/ML ophthalmic solution Place 1 drop into the right eye 2 (two) times daily.     JARDIANCE 10 MG TABS tablet Take 10 mg by mouth daily.     LACTASE ENZYME PO Take by mouth.  *natural lactase enzyme* 3000 fcc alu     linagliptin (TRADJENTA) 5 MG TABS tablet Take 1 tablet (5 mg total) by mouth daily. 30 tablet 1   lisinopril (PRINIVIL,ZESTRIL) 40 MG tablet Take 40 mg by mouth daily.      Multiple Vitamin (MULTIVITAMIN WITH MINERALS) TABS tablet Take 1 tablet by mouth daily.     Netarsudil-Latanoprost (ROCKLATAN) 0.02-0.005 % SOLN Apply to eye.     omeprazole (PRILOSEC) 20 MG capsule Take 20 mg by mouth daily.     Probiotic Product (PROBIOTIC PO) Take by mouth. *10 strain probiotic* contains 10 strains of beneficial bacteria with 10 mg b infantis     sildenafil (VIAGRA) 100 MG tablet Take 100 mg by mouth daily as needed for erectile dysfunction.      tamsulosin (FLOMAX) 0.4 MG CAPS capsule Take 0.4 mg by mouth daily.     testosterone cypionate (DEPOTESTOSTERONE CYPIONATE) 200 MG/ML injection Inject 200 mg into the muscle every 14 (fourteen) days.     valACYclovir (VALTREX) 1000 MG tablet Take 1,000 mg by mouth daily.     XARELTO 20 MG TABS tablet Take 20 mg by mouth daily.   5    Home: Home Living Family/patient expects to be discharged to:: Private residence Living Arrangements: Alone (2 dachaunsa) Available Help at Discharge: Family, Available PRN/intermittently Type of Home: House Home Access: Stairs to enter [employer] of Steps: 4 Entrance Stairs-Rails: Right, Left, Can reach both Home Layout: One level Bathroom Shower/Tub: Health visitor: Standard Home Equipment: Medical laboratory scientific officer - single point (was his dads)  Lives With: Alone  Functional History: Prior Function Prior Level of Function : Independent/Modified Independent, Driving Mobility Comments: no DME at baseline ADLs Comments: independent, drives Functional Status:  Mobility: Bed Mobility Overal bed mobility: Needs Assistance Bed Mobility: Sit to Supine Sit to supine: Min assist General bed mobility comments: minA to manage lines, otherwise increased time and cues for  sequencing Transfers Overall transfer level: Needs assistance Equipment used: None Transfers: Sit to/from Stand Sit to Stand: Contact guard assist General transfer comment: CGA for safety, no overt LOB Ambulation/Gait Ambulation/Gait assistance: Min assist, Contact guard assist Gait Distance (Feet): 125 Feet Assistive device: None (pt declined) Gait Pattern/deviations: Step-through pattern, Staggering right, Staggering left, Drifts right/left, Narrow base of support General Gait Details: pt with frequent LOB with challenge needing minA to recover, especially with turning and head movements. Pt with no overt scissoring steps or buckling, decreased awareness to R and ran into various objects of varying heights on R side Gait velocity: decreased Gait velocity interpretation: <1.8 ft/sec, indicate of risk for recurrent falls Stairs: Yes Stairs assistance: Contact guard assist Stair Management: One rail Right, Step to pattern, Forwards Number of Stairs: 5 General stair comments: ascends with LLE    ADL: ADL Overall ADL's : Needs assistance/impaired Eating/Feeding: NPO Grooming: Wash/dry hands, Wash/dry  face, Contact guard assist, Minimal assistance, Cueing for sequencing, Standing Grooming Details (indicate cue type and reason): needed cues for soap Upper Body Bathing: Set up, Sitting Upper Body Bathing Details (indicate cue type and reason): unsafe for standing currently Lower Body Bathing: Contact guard assist, Sitting/lateral leans Upper Body Dressing : Set up, Sitting Upper Body Dressing Details (indicate cue type and reason): extra gown like robe Lower Body Dressing: Minimal assistance, Sit to/from stand Lower Body Dressing Details (indicate cue type and reason): donning socks Toilet Transfer: Minimal assistance, Ambulation Toileting- Clothing Manipulation and Hygiene: Contact guard assist, Sitting/lateral lean Functional mobility during ADLs: Minimal assistance, Cueing for  safety (bumping into door frame on R side) General ADL Comments: decreased awareness, balance, safety, cognition  Cognition: Cognition Overall Cognitive Status: No family/caregiver present to determine baseline cognitive functioning Arousal/Alertness: Awake/alert Orientation Level: Oriented X4 Attention: Sustained Sustained Attention: Appears intact Memory: Impaired Memory Impairment: Decreased recall of new information Awareness: Impaired Awareness Impairment: Intellectual impairment Cognition Arousal: Alert Behavior During Therapy: WFL for tasks assessed/performed Overall Cognitive Status: No family/caregiver present to determine baseline cognitive functioning  Blood pressure (!) 148/101, pulse 84, temperature 98.2 F (36.8 C), temperature source Oral, resp. rate 20, height 6\' 1"  (1.854 m), weight 93.4 kg, SpO2 96%. Physical Exam  General: No apparent distress HEENT: Head is normocephalic, atraumatic, sclera anicteric, oral mucosa pink and moist, dentition intact Neck: Supple without JVD or lymphadenopathy Heart: Reg rate and rhythm. No murmurs rubs or gallops Chest: CTA bilaterally without wheezes, rales, or rhonchi; no distress Abdomen: Soft, non-tender, non-distended, bowel sounds positive. Extremities: No clubbing, cyanosis, or edema. Pulses are 2+ Psych: Pt's affect is appropriate. Pt is cooperative Skin: Clean and intact without signs of breakdown Neuro:    Mental Status: AAOx3, memory intact grossly. Poor safety awareness. Speech/Languate: Naming and repetition intact, + Dysarthria, follows simple commands CRANIAL NERVES: II: PERRL. Visual fields full III, IV, VI: EOM intact, no gaze preference or deviation V: normal sensation bilaterally VII: Left facial droop-mild  VIII: normal hearing to speech IX, X: normal palatal elevation XI: 5/5 head turn and 5/5 shoulder shrug bilaterally XII: Tongue midline   MOTOR: RUE: 5/5 Deltoid, 5/5 Biceps, 5/5 Triceps,5/5  Grip LUE: 5/5 Deltoid, 5/5 Biceps, 5/5 Triceps, 5/5 Grip RLE: HF 5/5, KE 5/5, ADF 5/5, APF 5/5 LLE: HF 5/5, KE 5/5, ADF 5/5, APF 5/5  SENSORY: Normal to touch all 4 extremities  Coordination: Normal finger to nose no tremor   Results for orders placed or performed during the hospital encounter of 04/07/23 (from the past 24 hours)  Glucose, capillary     Status: Abnormal   Collection Time: 04/08/23  3:21 PM  Result Value Ref Range   Glucose-Capillary 192 (H) 70 - 99 mg/dL  Glucose, capillary     Status: Abnormal   Collection Time: 04/08/23  9:52 PM  Result Value Ref Range   Glucose-Capillary 236 (H) 70 - 99 mg/dL  Basic metabolic panel     Status: Abnormal   Collection Time: 04/09/23  7:02 AM  Result Value Ref Range   Sodium 137 135 - 145 mmol/L   Potassium 3.8 3.5 - 5.1 mmol/L   Chloride 109 98 - 111 mmol/L   CO2 22 22 - 32 mmol/L   Glucose, Bld 138 (H) 70 - 99 mg/dL   BUN 13 8 - 23 mg/dL   Creatinine, Ser 4.78 0.61 - 1.24 mg/dL   Calcium 8.8 (L) 8.9 - 10.3 mg/dL   GFR, Estimated >29 >56  mL/min   Anion gap 6 5 - 15  CBC     Status: Abnormal   Collection Time: 04/09/23  7:02 AM  Result Value Ref Range   WBC 8.3 4.0 - 10.5 K/uL   RBC 5.62 4.22 - 5.81 MIL/uL   Hemoglobin 17.0 13.0 - 17.0 g/dL   HCT 16.1 09.6 - 04.5 %   MCV 92.2 80.0 - 100.0 fL   MCH 30.2 26.0 - 34.0 pg   MCHC 32.8 30.0 - 36.0 g/dL   RDW 40.9 81.1 - 91.4 %   Platelets 135 (L) 150 - 400 K/uL   nRBC 0.0 0.0 - 0.2 %  Glucose, capillary     Status: Abnormal   Collection Time: 04/09/23  7:06 AM  Result Value Ref Range   Glucose-Capillary 148 (H) 70 - 99 mg/dL   Comment 1 Notify RN    MR BRAIN WO CONTRAST Result Date: 04/08/2023 CLINICAL DATA:  Neuro deficit, acute, stroke suspected; Stroke, follow up EXAM: MRI HEAD WITHOUT CONTRAST MRA HEAD WITHOUT CONTRAST TECHNIQUE: Multiplanar, multi-echo pulse sequences of the brain and surrounding structures were acquired without intravenous contrast. Angiographic  images of the Circle of Willis were acquired using MRA technique without intravenous contrast. COMPARISON:  CTA head/neck April 07, 2023.  MRI/MRA 10/23/2014. FINDINGS: MRI HEAD FINDINGS Brain: Acute infarct in the left corona radiata. Mild associated edema without mass effect. No midline shift. No evidence of acute hemorrhage, mass lesion, midline shift or hydrocephalus. Advanced patchy and confluent T2/FLAIR hyperintensities in the white matter, compatible with chronic microvascular ischemic disease. Vascular: See below. Skull and upper cervical spine: Normal marrow signal. Sinuses/Orbits: Mostly clear sinuses. Other: No mastoid effusions. MRA HEAD FINDINGS Anterior circulation: Bilateral intracranial ICAs, MCAs, and ACAs are patent without proximal hemodynamically significant stenosis. 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 Posterior circulation: Left dominant vertebral artery. Right vertebral artery terminates as PICA, anatomic variant. The vertebral arteries, basilar artery and right posterior cerebral artery are patent. When comparing across modalities, similar occluded distal basilar artery and proximal left PCA at its origin with poor/irregular distal flow related signal. Anatomic variants: Detailed above. IMPRESSION: 1. Acute infarct in the left corona radiata. 2. 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 3. 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 4. Advanced chronic microvascular ischemic disease. Electronically Signed   By: Feliberto Harts [name]   On: 04/08/2023 23:28   MR ANGIO HEAD WO CONTRAST Result Date: 04/08/2023 CLINICAL DATA:  Neuro deficit, acute, stroke suspected; Stroke, follow up EXAM: MRI HEAD WITHOUT CONTRAST MRA HEAD WITHOUT CONTRAST TECHNIQUE: Multiplanar, multi-echo pulse sequences of the brain and  surrounding structures were acquired without intravenous contrast. Angiographic images of the Circle of Willis were acquired using MRA technique without intravenous contrast. COMPARISON:  CTA head/neck April 07, 2023.  MRI/MRA 10/23/2014. FINDINGS: MRI HEAD FINDINGS Brain: Acute infarct in the left corona radiata. Mild associated edema without mass effect. No midline shift. No evidence of acute hemorrhage, mass lesion, midline shift or hydrocephalus. Advanced patchy and confluent T2/FLAIR hyperintensities in the white matter, compatible with chronic microvascular ischemic disease. Vascular: See below. Skull and upper cervical spine: Normal marrow signal. Sinuses/Orbits: Mostly clear sinuses. Other: No mastoid effusions. MRA HEAD FINDINGS Anterior circulation: Bilateral intracranial ICAs, MCAs, and ACAs are patent without proximal hemodynamically significant stenosis. 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 Posterior circulation: Left dominant vertebral artery. Right vertebral artery terminates as PICA, anatomic variant. The vertebral arteries, basilar artery and right posterior cerebral artery are patent. When comparing across modalities, similar occluded distal basilar artery and proximal left PCA at its origin with poor/irregular distal flow related signal. Anatomic variants: Detailed above. IMPRESSION: 1. Acute infarct in the left corona radiata. 2. 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 3. 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 4. Advanced chronic microvascular ischemic disease. Electronically Signed   By: Feliberto Harts [name]   On: 04/08/2023 23:28   ECHOCARDIOGRAM COMPLETE Result Date: 04/08/2023    ECHOCARDIOGRAM REPORT   Patient Name:   [name] Date of Exam: 04/08/2023 Medical Rec #:  [id number]      Height:        73.0 in Accession #:    [id number]     Weight:       205.9 lb Date of Birth:  [birth year]      BSA:          2.178 m Patient Age:    62 years       BP:           181/99 mmHg Patient Gender: M              HR:           70 bpm. Exam Location:  Inpatient Procedure: 2D Echo, Cardiac Doppler and Color Doppler (Both Spectral and Color            Flow Doppler were utilized during procedure). Indications:    Stroke I63.9  History:        Patient has prior history of Echocardiogram examinations, most                 recent 09/05/2019. Stroke; Risk Factors:Hypertension, Diabetes,                 Dyslipidemia and Non-Smoker.  Sonographer:    Dondra Prader RVT RCS Referring Phys: [id number] [facility] IMPRESSIONS  1. Left ventricular ejection fraction, by estimation, is 55 to 60%. The left ventricle has normal function. The left ventricle has no regional wall motion abnormalities. Left ventricular diastolic parameters were normal.  2. Right ventricular systolic function is normal. The right ventricular size is normal.  3. The mitral valve is normal in structure. Trivial mitral valve regurgitation.  4. The aortic valve is tricuspid. Aortic valve regurgitation is mild. Comparison(s): The left ventricular function is unchanged. FINDINGS  Left Ventricle: Left ventricular ejection fraction, by estimation, is 55 to 60%. The left ventricle has normal function. The left ventricle has no regional wall motion abnormalities. The left ventricular internal cavity size was normal in size. There is  no left ventricular hypertrophy. Left ventricular diastolic parameters were normal. Right Ventricle: The right ventricular size is normal. Right vetricular wall thickness was not assessed. Right ventricular systolic function is normal. Left Atrium: Left atrial size was normal in size. Right Atrium: Right atrial size was normal in size. Pericardium: Trivial pericardial effusion is present. Mitral Valve: The mitral valve is normal in structure.  Trivial mitral valve regurgitation. Tricuspid Valve: The tricuspid valve is normal in structure. Tricuspid valve regurgitation is trivial. Aortic Valve: The aortic valve is tricuspid. Aortic valve regurgitation is mild. Aortic regurgitation PHT measures 624 msec. Aortic valve mean gradient measures 3.0 mmHg. Aortic valve peak gradient  measures 5.1 mmHg. Aortic valve area, by VTI measures 3.02 cm. Pulmonic Valve: The pulmonic valve was normal in structure. Pulmonic valve regurgitation is not visualized. Aorta: The aortic root is normal in size and structure. IAS/Shunts: No atrial level shunt detected by color flow Doppler.  LEFT VENTRICLE PLAX 2D LVIDd:         4.60 cm   Diastology LVIDs:         3.40 cm   LV e' medial:    5.84 cm/s LV PW:         1.10 cm   LV E/e' medial:  10.5 LV IVS:        1.00 cm   LV e' lateral:   8.66 cm/s LVOT diam:     2.10 cm   LV E/e' lateral: 7.1 LV SV:         67 LV SV Index:   31 LVOT Area:     3.46 cm  RIGHT VENTRICLE             IVC RV S prime:     10.20 cm/s  IVC diam: 1.70 cm TAPSE (M-mode): 1.9 cm LEFT ATRIUM             Index        RIGHT ATRIUM          Index LA diam:        2.80 cm 1.29 cm/m   RA Area:     7.60 cm LA Vol (A2C):   36.5 ml 16.76 ml/m  RA Volume:   15.50 ml 7.12 ml/m LA Vol (A4C):   48.0 ml 22.03 ml/m LA Biplane Vol: 43.7 ml 20.06 ml/m  AORTIC VALVE                    PULMONIC VALVE AV Area (Vmax):    3.43 cm     PV Vmax:       0.81 m/s AV Area (Vmean):   3.05 cm     PV Peak grad:  2.7 mmHg AV Area (VTI):     3.02 cm AV Vmax:           113.33 cm/s AV Vmean:          78.600 cm/s AV VTI:            0.222 m AV Peak Grad:      5.1 mmHg AV Mean Grad:      3.0 mmHg LVOT Vmax:         112.33 cm/s LVOT Vmean:        69.233 cm/s LVOT VTI:          0.194 m LVOT/AV VTI ratio: 0.87 AI PHT:            624 msec AR Vena Contracta: 0.70 cm  AORTA Ao Root diam: 3.30 cm MITRAL VALVE MV Area (PHT): 3.85 cm    SHUNTS MV Decel Time: 197 msec    Systemic VTI:  0.19 m MV E  velocity: 61.60 cm/s  Systemic Diam: 2.10 cm MV A velocity: 67.70 cm/s MV E/A ratio:  0.91 Dietrich Pates MD Electronically signed by Dietrich Pates MD Signature Date/Time: 04/08/2023/4:16:27 PM    Final    CT ANGIO HEAD NECK W WO CM W PERF (CODE STROKE) Result Date: 04/07/2023 CLINICAL DATA:  Provided history: Neuro deficit, acute, stroke suspected. Additional history provided: Balance difficulty, slurred speech, lip tingling. EXAM: CT ANGIOGRAPHY HEAD AND NECK CT PERFUSION BRAIN TECHNIQUE: Multidetector CT  imaging of the head and neck was performed using the standard protocol during bolus administration of intravenous contrast. Multiplanar CT image reconstructions and MIPs were obtained to evaluate the vascular anatomy. Carotid stenosis measurements (when applicable) are obtained utilizing NASCET criteria, using the distal internal carotid diameter as the denominator. Multiphase CT imaging of the brain was performed following IV bolus contrast injection. Subsequent parametric perfusion maps were calculated using RAPID software. RADIATION DOSE REDUCTION: This exam was performed according to the departmental dose-optimization program which includes automated exposure control, adjustment of the mA and/or kV according to patient size and/or use of iterative reconstruction technique. CONTRAST:  Administered contrast not known at this time. COMPARISON:  MRI brain and MRA head 09/05/2019. FINDINGS: CT HEAD FINDINGS Brain: Generalized cerebral and cerebellar atrophy. Chronic lacunar infarct within the left external capsule. Advanced patchy and ill-defined hypoattenuation elsewhere within the cerebral white matter, nonspecific but compatible with chronic small vessel disease. Subcentimeter age-indeterminate infarct within the right cerebellar hemisphere (series 1, image 8). Redemonstrated small chronic infarct within the left cerebellar hemisphere. There is no acute intracranial hemorrhage. No demarcated cortical infarct. No  extra-axial fluid collection. No evidence of an intracranial mass. No midline shift. Vascular: No hyperdense vessel.  Atherosclerotic calcifications. Skull: No calvarial fracture or aggressive osseous lesion. Sinuses/Orbits: No mass or acute finding within the imaged orbits. Mild mucosal thickening within the right maxillary sinus. ASPECTS ([place] Stroke Program Early CT Score) - Ganglionic level infarction (caudate, lentiform nuclei, internal capsule, insula, M1-M3 cortex): 7 - Supraganglionic infarction (M4-M6 cortex): 3 Total score (0-10 with 10 being normal): 10 Review of the MIP images confirms the above findings Study findings discussed by telephone at the time of interpretation on 04/07/2023 at 1:20 pm with provider ASHISH ARORA. CTA NECK FINDINGS Aortic arch: Standard aortic branching. The visualized thoracic aorta is normal in caliber. Streak/beam hardening artifact arising from a dense contrast bolus partially obscures the left subclavian artery. Within this limitation, there is no appreciable hemodynamically significant innominate or proximal subclavian artery stenosis. Right carotid system: CCA and ICA patent within the neck without stenosis or significant atherosclerotic disease. Left carotid system: CCA and ICA patent within the neck. Soft plaque and/or carotid web within the carotid bulb resulting in less than 50% stenosis. Vertebral arteries: Venous reflux of contrast limits evaluation of the V1 and proximal V2 vertebral arteries bilaterally. Within this limitation, the vertebral arteries are patent within the neck without stenosis or significant atherosclerotic disease. Skeleton: Levocurvature of the lower cervical and visualized upper thoracic spine. Cervical spondylosis. No acute fracture or aggressive osseous lesion. Other neck: Subcentimeter thyroid nodules not meeting consensus criteria for ultrasound follow-up based on size. No follow-up imaging recommended. Reference: J Am Coll Radiol. 2015  Feb;12(2): 143-50. Upper chest: Multiple pleural-based right upper lobe pulmonary nodules measuring up to 14 mm. Review of the MIP images confirms the above findings CTA HEAD FINDINGS Anterior circulation: The intracranial internal carotid arteries are patent. Nonstenotic atherosclerotic plaque within the supraclinoid left ICA. The M1 middle cerebral arteries are patent. Atherosclerotic irregularity of the M2 and more distal MCA vessels, bilaterally. No M2 proximal branch occlusion is identified. The anterior cerebral arteries are patent. Atherosclerotic irregularity of both vessels. Most notably, there are sites of severe stenosis within the left anterior cerebral artery A3 segment. 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 Posterior circulation: New from the prior MRA head of 09/05/2019, the intracranial right vertebral artery is occluded beyond the PICA origin.  The intracranial left vertebral artery is patent. New from the prior MRA, the distal basilar artery is occluded (just beyond the SCA origins). Enhancement is present within the proximal superior cerebellar arteries bilaterally. The left posterior cerebral artery is occluded from its origin. The left posterior communicating artery is diminutive or absent. The right posterior cerebral artery is predominantly (or entirely) fetal in origin, and patent. Severe stenosis within the right PCA P2 segment, unchanged from the prior MRA. Venous sinuses: Within the limitations of contrast timing, no convincing thrombus. Anatomic variants: As described. Review of the MIP images confirms the above findings CT Brain Perfusion Findings: CBF (<30%) Volume: 0mL Perfusion (Tmax>6.0s) volume: 42mL Mismatch Volume: 42mL Infarction Location:None identified. CTA head Impressions #1 and #2 called by telephone at the time of interpretation on 04/07/2023 at 1:20 pm to provider [facility], who verbally acknowledged these  results. IMPRESSION: Non-contrast head CT: 1. Small age-indeterminate infarct within the right cerebellar hemisphere. 2. Background parenchymal atrophy, chronic small vessel ischemic disease and chronic infarcts. CTA neck: 1. The common carotid and internal carotid arteries are patent within the neck. Soft plaque and/or a carotid web within the left carotid bulb, resulting in less than 50% stenosis. 2. Venous reflux of contrast limits evaluation of the bilateral vertebral artery V1 and proximal V2 segments. Within this limitation, the cervical vertebral arteries are patent without stenosis or significant atherosclerotic disease. 3. Multiple pleural-based right upper lobe pulmonary nodules measuring up to 14 mm. Non-contrast chest CT at 3-6 months is recommended. If the nodules are stable at time of repeat CT, then future CT at 18-24 months (from today's scan) is considered optional for low-risk patients, but is recommended for high-risk patients. This recommendation follows the consensus statement: Guidelines for Management of Incidental Pulmonary Nodules Detected on CT Images: From the Fleischner Society 2017; Radiology 2017; 284:228-243. CTA head: 1. Distal basilar artery occlusion, new from the prior MRA head of 09/05/2019. Additionally, the left posterior cerebral artery is also occluded from its origin. The right posterior cerebral artery is predominantly (or entirely) fetal in origin and patent. 2. 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 3. Background intracranial atherosclerotic disease as described. Most notably, there is an unchanged severe stenosis within the right posterior cerebral artery P2 segment, and there are severe stenoses within the left anterior cerebral artery A3 segment. 4. 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 CT perfusion head: The perfusion software identifies regions of critically hypoperfused parenchyma within the posterior  circulation totaling 42 mL (utilizing the Tmax>6 seconds threshold). No core infarct is identified. Reported mismatch volume: 42 mL. Electronically Signed   By: Jackey Loge D.O.   On: 04/07/2023 14:06   CT CEREBRAL PERFUSION W CONTRAST Result Date: 04/07/2023 CLINICAL DATA:  Provided history: Neuro deficit, acute, stroke suspected. Additional history provided: Balance difficulty, slurred speech, lip tingling. EXAM: CT ANGIOGRAPHY HEAD AND NECK CT PERFUSION BRAIN TECHNIQUE: Multidetector CT imaging of the head and neck was performed using the standard protocol during bolus administration of intravenous contrast. Multiplanar CT image reconstructions and MIPs were obtained to evaluate the vascular anatomy. Carotid stenosis measurements (when applicable) are obtained utilizing NASCET criteria, using the distal internal carotid diameter as the denominator. Multiphase CT imaging of the brain was performed following IV bolus contrast injection. Subsequent parametric perfusion maps were calculated using RAPID software. RADIATION DOSE REDUCTION: This exam was performed according to the departmental dose-optimization program which includes automated exposure control, adjustment  of the mA and/or kV according to patient size and/or use of iterative reconstruction technique. CONTRAST:  Administered contrast not known at this time. COMPARISON:  MRI brain and MRA head 09/05/2019. FINDINGS: CT HEAD FINDINGS Brain: Generalized cerebral and cerebellar atrophy. Chronic lacunar infarct within the left external capsule. Advanced patchy and ill-defined hypoattenuation elsewhere within the cerebral white matter, nonspecific but compatible with chronic small vessel disease. Subcentimeter age-indeterminate infarct within the right cerebellar hemisphere (series 1, image 8). Redemonstrated small chronic infarct within the left cerebellar hemisphere. There is no acute intracranial hemorrhage. No demarcated cortical infarct. No extra-axial  fluid collection. No evidence of an intracranial mass. No midline shift. Vascular: No hyperdense vessel.  Atherosclerotic calcifications. Skull: No calvarial fracture or aggressive osseous lesion. Sinuses/Orbits: No mass or acute finding within the imaged orbits. Mild mucosal thickening within the right maxillary sinus. ASPECTS ([place] Stroke Program Early CT Score) - Ganglionic level infarction (caudate, lentiform nuclei, internal capsule, insula, M1-M3 cortex): 7 - Supraganglionic infarction (M4-M6 cortex): 3 Total score (0-10 with 10 being normal): 10 Review of the MIP images confirms the above findings Study findings discussed by telephone at the time of interpretation on 04/07/2023 at 1:20 pm with provider ASHISH ARORA. CTA NECK FINDINGS Aortic arch: Standard aortic branching. The visualized thoracic aorta is normal in caliber. Streak/beam hardening artifact arising from a dense contrast bolus partially obscures the left subclavian artery. Within this limitation, there is no appreciable hemodynamically significant innominate or proximal subclavian artery stenosis. Right carotid system: CCA and ICA patent within the neck without stenosis or significant atherosclerotic disease. Left carotid system: CCA and ICA patent within the neck. Soft plaque and/or carotid web within the carotid bulb resulting in less than 50% stenosis. Vertebral arteries: Venous reflux of contrast limits evaluation of the V1 and proximal V2 vertebral arteries bilaterally. Within this limitation, the vertebral arteries are patent within the neck without stenosis or significant atherosclerotic disease. Skeleton: Levocurvature of the lower cervical and visualized upper thoracic spine. Cervical spondylosis. No acute fracture or aggressive osseous lesion. Other neck: Subcentimeter thyroid nodules not meeting consensus criteria for ultrasound follow-up based on size. No follow-up imaging recommended. Reference: J Am Coll Radiol. 2015 Feb;12(2):  143-50. Upper chest: Multiple pleural-based right upper lobe pulmonary nodules measuring up to 14 mm. Review of the MIP images confirms the above findings CTA HEAD FINDINGS Anterior circulation: The intracranial internal carotid arteries are patent. Nonstenotic atherosclerotic plaque within the supraclinoid left ICA. The M1 middle cerebral arteries are patent. Atherosclerotic irregularity of the M2 and more distal MCA vessels, bilaterally. No M2 proximal branch occlusion is identified. The anterior cerebral arteries are patent. Atherosclerotic irregularity of both vessels. Most notably, there are sites of severe stenosis within the left anterior cerebral artery A3 segment. 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 Posterior circulation: New from the prior MRA head of 09/05/2019, the intracranial right vertebral artery is occluded beyond the PICA origin. The intracranial left vertebral artery is patent. New from the prior MRA, the distal basilar artery is occluded (just beyond the SCA origins). Enhancement is present within the proximal superior cerebellar arteries bilaterally. The left posterior cerebral artery is occluded from its origin. The left posterior communicating artery is diminutive or absent. The right posterior cerebral artery is predominantly (or entirely) fetal in origin, and patent. Severe stenosis within the right PCA P2 segment, unchanged from the prior MRA. Venous sinuses: Within the limitations of contrast timing, no convincing thrombus. Anatomic  variants: As described. Review of the MIP images confirms the above findings CT Brain Perfusion Findings: CBF (<30%) Volume: 0mL Perfusion (Tmax>6.0s) volume: 42mL Mismatch Volume: 42mL Infarction Location:None identified. CTA head Impressions #1 and #2 called by telephone at the time of interpretation on 04/07/2023 at 1:20 pm to provider [facility], who verbally acknowledged these results.  IMPRESSION: Non-contrast head CT: 1. Small age-indeterminate infarct within the right cerebellar hemisphere. 2. Background parenchymal atrophy, chronic small vessel ischemic disease and chronic infarcts. CTA neck: 1. The common carotid and internal carotid arteries are patent within the neck. Soft plaque and/or a carotid web within the left carotid bulb, resulting in less than 50% stenosis. 2. Venous reflux of contrast limits evaluation of the bilateral vertebral artery V1 and proximal V2 segments. Within this limitation, the cervical vertebral arteries are patent without stenosis or significant atherosclerotic disease. 3. Multiple pleural-based right upper lobe pulmonary nodules measuring up to 14 mm. Non-contrast chest CT at 3-6 months is recommended. If the nodules are stable at time of repeat CT, then future CT at 18-24 months (from today's scan) is considered optional for low-risk patients, but is recommended for high-risk patients. This recommendation follows the consensus statement: Guidelines for Management of Incidental Pulmonary Nodules Detected on CT Images: From the Fleischner Society 2017; Radiology 2017; 284:228-243. CTA head: 1. Distal basilar artery occlusion, new from the prior MRA head of 09/05/2019. Additionally, the left posterior cerebral artery is also occluded from its origin. The right posterior cerebral artery is predominantly (or entirely) fetal in origin and patent. 2. 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 3. Background intracranial atherosclerotic disease as described. Most notably, there is an unchanged severe stenosis within the right posterior cerebral artery P2 segment, and there are severe stenoses within the left anterior cerebral artery A3 segment. 4. 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 CT perfusion head: The perfusion software identifies regions of critically hypoperfused parenchyma within the posterior  circulation totaling 42 mL (utilizing the Tmax>6 seconds threshold). No core infarct is identified. Reported mismatch volume: 42 mL. Electronically Signed   By: Jackey Loge D.O.   On: 04/07/2023 14:06   CT HEAD CODE STROKE WO CONTRAST Result Date: 04/07/2023 CLINICAL DATA:  Provided history: Neuro deficit, acute, stroke suspected. Additional history provided: Balance difficulty, slurred speech, lip tingling. EXAM: CT ANGIOGRAPHY HEAD AND NECK CT PERFUSION BRAIN TECHNIQUE: Multidetector CT imaging of the head and neck was performed using the standard protocol during bolus administration of intravenous contrast. Multiplanar CT image reconstructions and MIPs were obtained to evaluate the vascular anatomy. Carotid stenosis measurements (when applicable) are obtained utilizing NASCET criteria, using the distal internal carotid diameter as the denominator. Multiphase CT imaging of the brain was performed following IV bolus contrast injection. Subsequent parametric perfusion maps were calculated using RAPID software. RADIATION DOSE REDUCTION: This exam was performed according to the departmental dose-optimization program which includes automated exposure control, adjustment of the mA and/or kV according to patient size and/or use of iterative reconstruction technique. CONTRAST:  Administered contrast not known at this time. COMPARISON:  MRI brain and MRA head 09/05/2019. FINDINGS: CT HEAD FINDINGS Brain: Generalized cerebral and cerebellar atrophy. Chronic lacunar infarct within the left external capsule. Advanced patchy and ill-defined hypoattenuation elsewhere within the cerebral white matter, nonspecific but compatible with chronic small vessel disease. Subcentimeter age-indeterminate infarct within the right cerebellar hemisphere (series 1, image 8). Redemonstrated small chronic infarct within the left cerebellar hemisphere.  There is no acute intracranial hemorrhage. No demarcated cortical infarct. No extra-axial  fluid collection. No evidence of an intracranial mass. No midline shift. Vascular: No hyperdense vessel.  Atherosclerotic calcifications. Skull: No calvarial fracture or aggressive osseous lesion. Sinuses/Orbits: No mass or acute finding within the imaged orbits. Mild mucosal thickening within the right maxillary sinus. ASPECTS ([place]berta Stroke Program Early CT Score) - Ganglionic level infarction (caudate, lentiform nuclei, internal capsule, insula, M1-M3 cortex): 7 - Supraganglionic infarction (M4-M6 cortex): 3 Total score (0-10 with 10 being normal): 10 Review of the MIP images confirms the above findings Study findings discussed by telephone at the time of interpretation on 04/07/2023 at 1:20 pm with provider ASHISH ARORA. CTA NECK FINDINGS Aortic arch: Standard aortic branching. The visualized thoracic aorta is normal in caliber. Streak/beam hardening artifact arising from a dense contrast bolus partially obscures the left subclavian artery. Within this limitation, there is no appreciable hemodynamically significant innominate or proximal subclavian artery stenosis. Right carotid system: CCA and ICA patent within the neck without stenosis or significant atherosclerotic disease. Left carotid system: CCA and ICA patent within the neck. Soft plaque and/or carotid web within the carotid bulb resulting in less than 50% stenosis. Vertebral arteries: Venous reflux of contrast limits evaluation of the V1 and proximal V2 vertebral arteries bilaterally. Within this limitation, the vertebral arteries are patent within the neck without stenosis or significant atherosclerotic disease. Skeleton: Levocurvature of the lower cervical and visualized upper thoracic spine. Cervical spondylosis. No acute fracture or aggressive osseous lesion. Other neck: Subcentimeter thyroid nodules not meeting consensus criteria for ultrasound follow-up based on size. No follow-up imaging recommended. Reference: J Am Coll Radiol. 2015 Feb;12(2):  143-50. Upper chest: Multiple pleural-based right upper lobe pulmonary nodules measuring up to 14 mm. Review of the MIP images confirms the above findings CTA HEAD FINDINGS Anterior circulation: The intracranial internal carotid arteries are patent. Nonstenotic atherosclerotic plaque within the supraclinoid left ICA. The M1 middle cerebral arteries are patent. Atherosclerotic irregularity of the M2 and more distal MCA vessels, bilaterally. No M2 proximal branch occlusion is identified. The anterior cerebral arteries are patent. Atherosclerotic irregularity of both vessels. Most notably, there are sites of severe stenosis within the left anterior cerebral artery A3 segment. 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 Posterior circulation: New from the prior MRA head of 09/05/2019, the intracranial right vertebral artery is occluded beyond the PICA origin. The intracranial left vertebral artery is patent. New from the prior MRA, the distal basilar artery is occluded (just beyond the SCA origins). Enhancement is present within the proximal superior cerebellar arteries bilaterally. The left posterior cerebral artery is occluded from its origin. The left posterior communicating artery is diminutive or absent. The right posterior cerebral artery is predominantly (or entirely) fetal in origin, and patent. Severe stenosis within the right PCA P2 segment, unchanged from the prior MRA. Venous sinuses: Within the limitations of contrast timing, no convincing thrombus. Anatomic variants: As described. Review of the MIP images confirms the above findings CT Brain Perfusion Findings: CBF (<30%) Volume: 0mL Perfusion (Tmax>6.0s) volume: 42mL Mismatch Volume: 42mL Infarction Location:None identified. CTA head Impressions #1 and #2 called by telephone at the time of interpretation on 04/07/2023 at 1:20 pm to provider [facility], who verbally acknowledged these results.  IMPRESSION: Non-contrast head CT: 1. Small age-indeterminate infarct within the right cerebellar hemisphere. 2. Background parenchymal atrophy, chronic small vessel ischemic disease and chronic infarcts. CTA neck: 1. The common carotid  and internal carotid arteries are patent within the neck. Soft plaque and/or a carotid web within the left carotid bulb, resulting in less than 50% stenosis. 2. Venous reflux of contrast limits evaluation of the bilateral vertebral artery V1 and proximal V2 segments. Within this limitation, the cervical vertebral arteries are patent without stenosis or significant atherosclerotic disease. 3. Multiple pleural-based right upper lobe pulmonary nodules measuring up to 14 mm. Non-contrast chest CT at 3-6 months is recommended. If the nodules are stable at time of repeat CT, then future CT at 18-24 months (from today's scan) is considered optional for low-risk patients, but is recommended for high-risk patients. This recommendation follows the consensus statement: Guidelines for Management of Incidental Pulmonary Nodules Detected on CT Images: From the Fleischner Society 2017; Radiology 2017; 284:228-243. CTA head: 1. Distal basilar artery occlusion, new from the prior MRA head of 09/05/2019. Additionally, the left posterior cerebral artery is also occluded from its origin. The right posterior cerebral artery is predominantly (or entirely) fetal in origin and patent. 2. 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 3. Background intracranial atherosclerotic disease as described. Most notably, there is an unchanged severe stenosis within the right posterior cerebral artery P2 segment, and there are severe stenoses within the left anterior cerebral artery A3 segment. 4. 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 CT perfusion head: The perfusion software identifies regions of critically hypoperfused parenchyma within the posterior  circulation totaling 42 mL (utilizing the Tmax>6 seconds threshold). No core infarct is identified. Reported mismatch volume: 42 mL. Electronically Signed   By: Jackey Loge D.O.   On: 04/07/2023 14:06    Assessment/Plan: Diagnosis: Acute CVA left corona radiata Does the need for close, 24 hr/day medical supervision in concert with the patient's rehab needs make it unreasonable for this patient to be served in a less intensive setting? 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 Yes Does the patient require coordinated care of a physician, rehab nurse, therapy disciplines of PT/OT/SLP to address physical and functional deficits in the context of the above medical diagnosis(es)? Yes Addressing deficits in the following areas: balance, endurance, locomotion, strength, transferring, bowel/bladder control, bathing, dressing, feeding, grooming, toileting, cognition, speech, language, and psychosocial support Can the patient actively participate in an intensive therapy program of at least 3 hrs of therapy per day at least 5 days per week? Yes The potential for patient to make measurable gains while on inpatient rehab is excellent Anticipated functional outcomes upon discharge from inpatient rehab are modified independent and supervision  with PT, modified independent and supervision with OT, modified independent and supervision with SLP. Estimated rehab length of stay to reach the above functional goals is: 7 days Anticipated discharge destination: Home Overall Rehab/Functional Prognosis: excellent  POST ACUTE RECOMMENDATIONS: This patient's  condition is appropriate for continued rehabilitative care in the following setting:  CIR vs outpatient PT Patient has agreed to participate in recommended program. No Note that insurance prior authorization may be required for reimbursement for recommended care.  Comment: I think pt could benefit from CIR to work on balance, safety, ADLs and language deficits.  Patient and mother feel that he has at baseline from PT/OT perspective and he is not interested in CIR  stay.  If he goes home would recommend 24/7 supervision and outpatient therapy.   I have personally performed a face to face diagnostic evaluation of this patient. Additionally, I have examined the patient's medical record including any pertinent labs and radiographic images.    Thanks,  Fanny Dance, MD 04/09/2023

## 2023-04-09 NOTE — Progress Notes (Signed)
 Inpatient Rehab Coordinator Note:  I met with patient and his mother at bedside to discuss CIR recommendations and goals/expectations of CIR stay.  We reviewed 3 hrs/day of therapy, physician follow up, and average length of stay 2 weeks (dependent upon progress) with goals of modified independence with mobility.  At this time pt prefers to d/c directly home with 24/7 support from mom and other friends.  Dr. Natale Lay in agreement.  I let TOC team know for hh services, and I will sign off for CIR.    Estill Dooms, PT, DPT Admissions Coordinator [phone] 04/09/23  1:49 PM

## 2023-04-09 NOTE — Progress Notes (Signed)
 Physical Therapy Treatment Patient Details Name: [name] MRN: [id number] DOB: July 12, 1960 Today's Date: 04/09/2023   History of Present Illness The pt is a 63 yo male presenting 3/5 with balance deficits and slurred speech. 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 PMH includes: HLD, HTN, DVT on Xarelto, Burkitts lymphoma, and prior L cerebellar stroke with baseline dysequilibrium.    PT Comments  Pt with fair tolerance to treatment today. Pt able to perform DGI again and demonstrated slight increase in score compared to yesterdays session. Pt increased from 9/24 to 12/24 however is still a very high fall risk. Pt is refusing AIR therefore updating DC recs to OP Neuro PT. Pt states that he does have a ride to OP therapy. PT will continue to follow.     If plan is discharge home, recommend the following: A little help with walking and/or transfers;A little help with bathing/dressing/bathroom;Assistance with cooking/housework;Direct supervision/assist for medications management;Assistance with feeding;Direct supervision/assist for financial management;Assist for transportation;Supervision due to cognitive status   Can travel by private vehicle        Equipment Recommendations  BSC/3in1    Recommendations for Other Services       Precautions / Restrictions Precautions Precautions: Fall Recall of Precautions/Restrictions: Impaired Restrictions Weight Bearing Restrictions Per Provider Order: No     Mobility  Bed Mobility Overal bed mobility: Needs Assistance Bed Mobility: Supine to Sit     Supine to sit: Supervision     General bed mobility comments: Pt able to sit EOB with no physical assist. Left seated EOB talking to MD.    Transfers Overall transfer level: Needs assistance Equipment used: None Transfers: Sit to/from Stand Sit to Stand: Contact guard assist            General transfer comment: CGA for safety, no overt LOB    Ambulation/Gait Ambulation/Gait assistance: Min assist, Contact guard assist Gait Distance (Feet): 300 Feet Assistive device: None Gait Pattern/deviations: Step-through pattern, Staggering right, Staggering left, Drifts right/left, Narrow base of support Gait velocity: decreased     General Gait Details: Pt with 1 minor LOB when turning requiring Min A to correct. Pt states that his balance deficits are baseline.   Stairs Stairs: Yes Stairs assistance: Contact guard assist Stair Management: One rail Right, Step to pattern, Forwards Number of Stairs: 2 General stair comments: Cues to slow down and for sequencing.   Wheelchair Mobility     Tilt Bed    Modified Rankin (Stroke Patients Only) Modified Rankin (Stroke Patients Only) Pre-Morbid Rankin Score: No significant disability Modified Rankin: Slight disability     Balance Overall balance assessment: Needs assistance, History of Falls Sitting-balance support: No upper extremity supported, Feet supported Sitting balance-Leahy Scale: Fair     Standing balance support: No upper extremity supported, During functional activity Standing balance-Leahy Scale: Fair Standing balance comment: can ambulate without UE suport but poor tolerance for challenge                 Standardized Balance Assessment Standardized Balance Assessment : Dynamic Gait Index   Dynamic Gait Index Level Surface: Mild Impairment Change in Gait Speed: Mild Impairment Gait with Horizontal Head Turns: Mild Impairment Gait with Vertical Head Turns: Mild Impairment Gait and Pivot Turn: Mild Impairment Step Over Obstacle: Severe Impairment Step Around Obstacles: Moderate Impairment Steps: Moderate Impairment Total Score: 12      Communication Communication Communication: No apparent difficulties Factors Affecting Communication:  Reduced clarity of speech  Cognition Arousal:  Alert Behavior During Therapy: WFL for tasks assessed/performed   PT - Cognitive impairments: Attention, Safety/Judgement                       PT - Cognition Comments: poor insight to deficits, states he is off balance at baseline and had little awareness of increased deficits Following commands: Intact      Cueing Cueing Techniques: Verbal cues, Gestural cues  Exercises      General Comments General comments (skin integrity, edema, etc.): VSS      Pertinent Vitals/Pain Pain Assessment Pain Assessment: Faces Faces Pain Scale: No hurt    Home Living       Type of Home: House                  Prior Function            PT Goals (current goals can now be found in the care plan section) Progress towards PT goals: Progressing toward goals    Frequency    Min 1X/week      PT Plan      Co-evaluation              AM-PAC PT "6 Clicks" Mobility   Outcome Measure  Help needed turning from your back to your side while in a flat bed without using bedrails?: A Little Help needed moving from lying on your back to sitting on the side of a flat bed without using bedrails?: A Little Help needed moving to and from a bed to a chair (including a wheelchair)?: A Little Help needed standing up from a chair using your arms (e.g., wheelchair or bedside chair)?: A Little Help needed to walk in hospital room?: A Lot Help needed climbing 3-5 steps with a railing? : A Lot 6 Click Score: 16    End of Session Equipment Utilized During Treatment: Gait belt Activity Tolerance: Patient tolerated treatment well Patient left: in bed;with call bell/phone within reach;Other (comment) (MD present) Nurse Communication: Mobility status PT Visit Diagnosis: Other abnormalities of gait and mobility (R26.89);Repeated falls (R29.6);Muscle weakness (generalized) (M62.81)     Time: 1610-9604 PT Time Calculation (min) (ACUTE ONLY): 14 min  Charges:    $Gait Training:  8-22 mins PT General Charges $$ ACUTE PT VISIT: 1 Visit                     Shela Nevin, PT, DPT Acute Rehab Services [phone]    Gladys Damme 04/09/2023, 3:24 PM

## 2023-04-09 NOTE — Progress Notes (Signed)
 Inpatient Rehab Admissions Coordinator:   At this time we are recommending a CIR consult and I will place an order per our protocol.    Estill Dooms, PT, DPT Admissions Coordinator [phone] 04/09/23  11:24 AM

## 2023-04-09 NOTE — Inpatient Diabetes Management (Signed)
 Inpatient Diabetes Program Recommendations  AACE/ADA: New Consensus Statement on Inpatient Glycemic Control (2015)  Target Ranges:  Prepandial:   less than 140 mg/dL      Peak postprandial:   less than 180 mg/dL (1-2 hours)      Critically ill patients:  140 - 180 mg/dL   Lab Results  Component Value Date   GLUCAP 223 (H) 04/09/2023   HGBA1C 9.3 (H) 04/07/2023     Discharge Recommendations: Short acting recommendations:  Meal coverage ONLY Insulin lispro (HUMALOG) KwikPen  4 units tid before meals     Use Adult Diabetes Insulin Treatment Post Discharge order set.   Christena Deem RN, MSN, BC-ADM Inpatient Diabetes Coordinator Team Pager [phone] (8a-5p)

## 2023-04-09 NOTE — Discharge Summary (Addendum)
 Stroke Discharge Summary  Patient ID: [name]   MRN: [id number]      DOB: 1960/07/17  Date of Admission: 04/07/2023 Date of Discharge: 04/09/2023  Attending Physician: Marvel Plan MD Consultant(s):    None  Patient's PCP:  Alysia Penna, MD  DISCHARGE PRIMARY DIAGNOSIS:  left CR infarct likely secondary to small vessel disease source    Other diagnosis History of stroke Chronic bilateral occlusion Diabetes Hypertension Hyperlipidemia Recurrent DVT  Lymphoma  Allergies as of 04/09/2023       Reactions   Codeine Nausea Only   Hydrocodone-acetaminophen Other (See Comments)   Makes patient sick   Vicodin [hydrocodone-acetaminophen] Nausea And Vomiting        Medication List     STOP taking these medications    Xarelto 20 MG Tabs tablet Generic drug: rivaroxaban       TAKE these medications    apixaban 5 MG Tabs tablet Commonly known as: ELIQUIS Take 1 tablet (5 mg total) by mouth 2 (two) times daily.   aspirin EC 81 MG tablet Take 81 mg by mouth daily. Swallow whole.   atorvastatin 40 MG tablet Commonly known as: LIPITOR Take 1 tablet (40 mg total) by mouth daily. Start taking on: April 10, 2023   Blood Glucose Monitor System w/Device Kit 1 each by Does not apply route 3 (three) times daily. May dispense any manufacturer covered by patient's insurance.   BLOOD GLUCOSE TEST STRIPS Strp Use 1 each 3 (three) times daily as directed to check blood sugar. May dispense any manufacturer covered by patient's insurance and fits patient's device.   Dexcom G7 Sensor Misc Change every 10 days to monitor blood glucose continuously   dextroamphetamine 5 MG tablet Commonly known as: DEXTROSTAT Take 5 mg by mouth 3 (three) times daily.   dorzolamide-timolol 2-0.5 % ophthalmic solution Commonly known as: COSOPT Place 1 drop into the right eye 2 (two) times daily.   insulin lispro 100 UNIT/ML KwikPen Commonly known as: HUMALOG Inject 0-15 Units into the  skin 3 (three) times daily with meals. If eating and Blood Glucose (BG) 80 or higher inject 4 units for meal coverage and add correction dose per scale. If not eating, correction dose only. BG <150= 0 unit; BG 150-200= 1 unit; BG 201-250= 3 unit; BG 251-300= 5 unit; BG 301-350= 7 unit; BG 351-400= 9 unit; BG >400= 11 unit and Call Primary Care.   Insulin Pen Needle 32G X 4 MM Misc Use 1 each 3 (three) times daily. May dispense any manufacturer covered by patient's insurance.   Jardiance 10 MG Tabs tablet Generic drug: empagliflozin Take 10 mg by mouth daily.   LACTASE ENZYME PO Take by mouth. *natural lactase enzyme* 3000 fcc alu   Lancet Device Misc 1 each by Does not apply route 3 (three) times daily. May dispense any manufacturer covered by patient's insurance.   linagliptin 5 MG Tabs tablet Commonly known as: TRADJENTA Take 1 tablet (5 mg total) by mouth daily.   lisinopril 40 MG tablet Commonly known as: ZESTRIL Take 40 mg by mouth daily.   multivitamin with minerals Tabs tablet Take 1 tablet by mouth daily.   omeprazole 20 MG capsule Commonly known as: PRILOSEC Take 20 mg by mouth daily.   OneTouch Delica Plus Lancet33G Misc 1 each 3 (three) times daily. Use as directed to check blood sugar. May dispense any manufacturer covered by patient's insurance and fits patient's device.   PROBIOTIC PO Take by  mouth. *10 strain probiotic* contains 10 strains of beneficial bacteria with 10 mg b infantis   Rocklatan 0.02-0.005 % Soln Generic drug: Netarsudil-Latanoprost Apply to eye.   sildenafil 100 MG tablet Commonly known as: VIAGRA Take 100 mg by mouth daily as needed for erectile dysfunction.   tamsulosin 0.4 MG Caps capsule Commonly known as: FLOMAX Take 0.4 mg by mouth daily.   testosterone cypionate 200 MG/ML injection Commonly known as: DEPOTESTOSTERONE CYPIONATE Inject 200 mg into the muscle every 14 (fourteen) days.   valACYclovir 1000 MG tablet Commonly  known as: VALTREX Take 1,000 mg by mouth daily.        LABORATORY STUDIES CBC    Component Value Date/Time   WBC 8.3 04/09/2023 0702   RBC 5.62 04/09/2023 0702   HGB 17.0 04/09/2023 0702   HGB 17.7 (H) 11/01/2018 1504   HGB 17.2 (H) 09/08/2016 0909   HCT 51.8 04/09/2023 0702   HCT 51.3 (H) 09/08/2016 0909   PLT 135 (L) 04/09/2023 0702   PLT 151 11/01/2018 1504   PLT 156 09/08/2016 0909   MCV 92.2 04/09/2023 0702   MCV 94.0 09/08/2016 0909   MCH 30.2 04/09/2023 0702   MCHC 32.8 04/09/2023 0702   RDW 13.7 04/09/2023 0702   RDW 15.2 (H) 09/08/2016 0909   LYMPHSABS 1.6 04/07/2023 1256   LYMPHSABS 1.3 09/08/2016 0909   MONOABS 0.6 04/07/2023 1256   MONOABS 0.5 09/08/2016 0909   EOSABS 0.1 04/07/2023 1256   EOSABS 0.1 09/08/2016 0909   BASOSABS 0.1 04/07/2023 1256   BASOSABS 0.0 09/08/2016 0909   CMP    Component Value Date/Time   NA 137 04/09/2023 0702   NA 138 09/08/2016 0909   K 3.8 04/09/2023 0702   K 4.1 09/08/2016 0909   CL 109 04/09/2023 0702   CL 103 02/19/2012 0806   CO2 22 04/09/2023 0702   CO2 26 09/08/2016 0909   GLUCOSE 138 (H) 04/09/2023 0702   GLUCOSE 111 09/08/2016 0909   GLUCOSE 84 02/19/2012 0806   BUN 13 04/09/2023 0702   BUN 16.2 09/08/2016 0909   CREATININE 0.97 04/09/2023 0702   CREATININE 1.49 (H) 03/22/2018 1348   CREATININE 1.1 09/08/2016 0909   CALCIUM 8.8 (L) 04/09/2023 0702   CALCIUM 9.9 09/08/2016 0909   PROT 8.0 04/07/2023 1256   PROT 7.2 09/08/2016 0909   ALBUMIN 4.9 04/07/2023 1256   ALBUMIN 4.1 09/08/2016 0909   AST 24 04/07/2023 1256   AST 34 03/22/2018 1348   AST 29 09/08/2016 0909   ALT 23 04/07/2023 1256   ALT 31 03/22/2018 1348   ALT 34 09/08/2016 0909   ALKPHOS 40 04/07/2023 1256   ALKPHOS 43 09/08/2016 0909   BILITOT 1.6 (H) 04/07/2023 1256   BILITOT 0.9 03/22/2018 1348   BILITOT 1.53 (H) 09/08/2016 0909   GFRNONAA >60 04/09/2023 0702   GFRNONAA 51 (L) 03/22/2018 1348   GFRAA >60 09/05/2019 0543   GFRAA 60  (L) 03/22/2018 1348   COAGS Lab Results  Component Value Date   INR 1.9 (H) 04/07/2023   INR 2.2 (H) 09/04/2019   INR 1.06 11/23/2014   PROTIME 46.8 (H) 02/01/2006   PROTIME 16.8 (H) 01/18/2006   PROTIME 18.0 (H) 01/11/2006   Lipid Panel    Component Value Date/Time   CHOL 193 04/08/2023 0551   TRIG 250 (H) 04/08/2023 0551   HDL 23 (L) 04/08/2023 0551   CHOLHDL 8.4 04/08/2023 0551   VLDL 50 (H) 04/08/2023 0551   LDLCALC 120 (H)  04/08/2023 0551   HgbA1C  Lab Results  Component Value Date   HGBA1C 9.3 (H) 04/07/2023   Alcohol Level    Component Value Date/Time   ETH <10 04/07/2023 1256     SIGNIFICANT DIAGNOSTIC STUDIES MR BRAIN WO CONTRAST Result Date: 04/08/2023 CLINICAL DATA:  Neuro deficit, acute, stroke suspected; Stroke, follow up EXAM: MRI HEAD WITHOUT CONTRAST MRA HEAD WITHOUT CONTRAST TECHNIQUE: Multiplanar, multi-echo pulse sequences of the brain and surrounding structures were acquired without intravenous contrast. Angiographic images of the Circle of Willis were acquired using MRA technique without intravenous contrast. COMPARISON:  CTA head/neck April 07, 2023.  MRI/MRA 10/23/2014. FINDINGS: MRI HEAD FINDINGS Brain: Acute infarct in the left corona radiata. Mild associated edema without mass effect. No midline shift. No evidence of acute hemorrhage, mass lesion, midline shift or hydrocephalus. Advanced patchy and confluent T2/FLAIR hyperintensities in the white matter, compatible with chronic microvascular ischemic disease. Vascular: See below. Skull and upper cervical spine: Normal marrow signal. Sinuses/Orbits: Mostly clear sinuses. Other: No mastoid effusions. MRA HEAD FINDINGS Anterior circulation: Bilateral intracranial ICAs, MCAs, and ACAs are patent without proximal hemodynamically significant stenosis. 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 Posterior circulation: Left dominant vertebral artery. Right  vertebral artery terminates as PICA, anatomic variant. The vertebral arteries, basilar artery and right posterior cerebral artery are patent. When comparing across modalities, similar occluded distal basilar artery and proximal left PCA at its origin with poor/irregular distal flow related signal. Anatomic variants: Detailed above. IMPRESSION: 1. Acute infarct in the left corona radiata. 2. 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 3. 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 4. Advanced chronic microvascular ischemic disease. Electronically Signed   By: Feliberto Harts [name]   On: 04/08/2023 23:28   MR ANGIO HEAD WO CONTRAST Result Date: 04/08/2023 CLINICAL DATA:  Neuro deficit, acute, stroke suspected; Stroke, follow up EXAM: MRI HEAD WITHOUT CONTRAST MRA HEAD WITHOUT CONTRAST TECHNIQUE: Multiplanar, multi-echo pulse sequences of the brain and surrounding structures were acquired without intravenous contrast. Angiographic images of the Circle of Willis were acquired using MRA technique without intravenous contrast. COMPARISON:  CTA head/neck April 07, 2023.  MRI/MRA 10/23/2014. FINDINGS: MRI HEAD FINDINGS Brain: Acute infarct in the left corona radiata. Mild associated edema without mass effect. No midline shift. No evidence of acute hemorrhage, mass lesion, midline shift or hydrocephalus. Advanced patchy and confluent T2/FLAIR hyperintensities in the white matter, compatible with chronic microvascular ischemic disease. Vascular: See below. Skull and upper cervical spine: Normal marrow signal. Sinuses/Orbits: Mostly clear sinuses. Other: No mastoid effusions. MRA HEAD FINDINGS Anterior circulation: Bilateral intracranial ICAs, MCAs, and ACAs are patent without proximal hemodynamically significant stenosis. 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 Posterior circulation: Left dominant vertebral artery. Right vertebral artery terminates as PICA, anatomic variant. The vertebral arteries, basilar artery and right posterior cerebral artery are patent. When comparing across modalities, similar occluded distal basilar artery and proximal left PCA at its origin with poor/irregular distal flow related signal. Anatomic variants: Detailed above. IMPRESSION: 1. Acute infarct in the left corona radiata. 2. 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 3. 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 4. Advanced chronic microvascular ischemic disease. Electronically Signed   By: Juluis Mire.D.  On: 04/08/2023 23:28   ECHOCARDIOGRAM COMPLETE Result Date: 04/08/2023    ECHOCARDIOGRAM REPORT   Patient Name:   [name] Date of Exam: 04/08/2023 Medical Rec #:  [id number]      Height:       73.0 in Accession #:    [id number]     Weight:       205.9 lb Date of Birth:  24-Dec-1960      BSA:          2.178 m Patient Age:    62 years       BP:           181/99 mmHg Patient Gender: M              HR:           70 bpm. Exam Location:  Inpatient Procedure: 2D Echo, Cardiac Doppler and Color Doppler (Both Spectral and Color            Flow Doppler were utilized during procedure). Indications:    Stroke I63.9  History:        Patient has prior history of Echocardiogram examinations, most                 recent 09/05/2019. Stroke; Risk Factors:Hypertension, Diabetes,                 Dyslipidemia and Non-Smoker.  Sonographer:    Dondra Prader RVT RCS Referring Phys: [id number] Circles Of Care IMPRESSIONS  1. Left ventricular ejection fraction, by estimation, is 55 to 60%. The left ventricle has normal function. The left ventricle has no regional wall motion abnormalities. Left ventricular diastolic parameters were normal.  2. Right ventricular systolic  function is normal. The right ventricular size is normal.  3. The mitral valve is normal in structure. Trivial mitral valve regurgitation.  4. The aortic valve is tricuspid. Aortic valve regurgitation is mild. Comparison(s): The left ventricular function is unchanged. FINDINGS  Left Ventricle: Left ventricular ejection fraction, by estimation, is 55 to 60%. The left ventricle has normal function. The left ventricle has no regional wall motion abnormalities. The left ventricular internal cavity size was normal in size. There is  no left ventricular hypertrophy. Left ventricular diastolic parameters were normal. Right Ventricle: The right ventricular size is normal. Right vetricular wall thickness was not assessed. Right ventricular systolic function is normal. Left Atrium: Left atrial size was normal in size. Right Atrium: Right atrial size was normal in size. Pericardium: Trivial pericardial effusion is present. Mitral Valve: The mitral valve is normal in structure. Trivial mitral valve regurgitation. Tricuspid Valve: The tricuspid valve is normal in structure. Tricuspid valve regurgitation is trivial. Aortic Valve: The aortic valve is tricuspid. Aortic valve regurgitation is mild. Aortic regurgitation PHT measures 624 msec. Aortic valve mean gradient measures 3.0 mmHg. Aortic valve peak gradient measures 5.1 mmHg. Aortic valve area, by VTI measures 3.02 cm. Pulmonic Valve: The pulmonic valve was normal in structure. Pulmonic valve regurgitation is not visualized. Aorta: The aortic root is normal in size and structure. IAS/Shunts: No atrial level shunt detected by color flow Doppler.  LEFT VENTRICLE PLAX 2D LVIDd:         4.60 cm   Diastology LVIDs:         3.40 cm   LV e' medial:    5.84 cm/s LV PW:         1.10 cm   LV E/e' medial:  10.5 LV IVS:  1.00 cm   LV e' lateral:   8.66 cm/s LVOT diam:     2.10 cm   LV E/e' lateral: 7.1 LV SV:         67 LV SV Index:   31 LVOT Area:     3.46 cm  RIGHT VENTRICLE              IVC RV S prime:     10.20 cm/s  IVC diam: 1.70 cm TAPSE (M-mode): 1.9 cm LEFT ATRIUM             Index        RIGHT ATRIUM          Index LA diam:        2.80 cm 1.29 cm/m   RA Area:     7.60 cm LA Vol (A2C):   36.5 ml 16.76 ml/m  RA Volume:   15.50 ml 7.12 ml/m LA Vol (A4C):   48.0 ml 22.03 ml/m LA Biplane Vol: 43.7 ml 20.06 ml/m  AORTIC VALVE                    PULMONIC VALVE AV Area (Vmax):    3.43 cm     PV Vmax:       0.81 m/s AV Area (Vmean):   3.05 cm     PV Peak grad:  2.7 mmHg AV Area (VTI):     3.02 cm AV Vmax:           113.33 cm/s AV Vmean:          78.600 cm/s AV VTI:            0.222 m AV Peak Grad:      5.1 mmHg AV Mean Grad:      3.0 mmHg LVOT Vmax:         112.33 cm/s LVOT Vmean:        69.233 cm/s LVOT VTI:          0.194 m LVOT/AV VTI ratio: 0.87 AI PHT:            624 msec AR Vena Contracta: 0.70 cm  AORTA Ao Root diam: 3.30 cm MITRAL VALVE MV Area (PHT): 3.85 cm    SHUNTS MV Decel Time: 197 msec    Systemic VTI:  0.19 m MV E velocity: 61.60 cm/s  Systemic Diam: 2.10 cm MV A velocity: 67.70 cm/s MV E/A ratio:  0.91 Dietrich Pates MD Electronically signed by Dietrich Pates MD Signature Date/Time: 04/08/2023/4:16:27 PM    Final    CT ANGIO HEAD NECK W WO CM W PERF (CODE STROKE) Result Date: 04/07/2023 CLINICAL DATA:  Provided history: Neuro deficit, acute, stroke suspected. Additional history provided: Balance difficulty, slurred speech, lip tingling. EXAM: CT ANGIOGRAPHY HEAD AND NECK CT PERFUSION BRAIN TECHNIQUE: Multidetector CT imaging of the head and neck was performed using the standard protocol during bolus administration of intravenous contrast. Multiplanar CT image reconstructions and MIPs were obtained to evaluate the vascular anatomy. Carotid stenosis measurements (when applicable) are obtained utilizing NASCET criteria, using the distal internal carotid diameter as the denominator. Multiphase CT imaging of the brain was performed following IV bolus contrast injection.  Subsequent parametric perfusion maps were calculated using RAPID software. RADIATION DOSE REDUCTION: This exam was performed according to the departmental dose-optimization program which includes automated exposure control, adjustment of the mA and/or kV according to patient size and/or use of iterative reconstruction technique. CONTRAST:  Administered contrast not known at  this time. COMPARISON:  MRI brain and MRA head 09/05/2019. FINDINGS: CT HEAD FINDINGS Brain: Generalized cerebral and cerebellar atrophy. Chronic lacunar infarct within the left external capsule. Advanced patchy and ill-defined hypoattenuation elsewhere within the cerebral white matter, nonspecific but compatible with chronic small vessel disease. Subcentimeter age-indeterminate infarct within the right cerebellar hemisphere (series 1, image 8). Redemonstrated small chronic infarct within the left cerebellar hemisphere. There is no acute intracranial hemorrhage. No demarcated cortical infarct. No extra-axial fluid collection. No evidence of an intracranial mass. No midline shift. Vascular: No hyperdense vessel.  Atherosclerotic calcifications. Skull: No calvarial fracture or aggressive osseous lesion. Sinuses/Orbits: No mass or acute finding within the imaged orbits. Mild mucosal thickening within the right maxillary sinus. ASPECTS ([place] Stroke Program Early CT Score) - Ganglionic level infarction (caudate, lentiform nuclei, internal capsule, insula, M1-M3 cortex): 7 - Supraganglionic infarction (M4-M6 cortex): 3 Total score (0-10 with 10 being normal): 10 Review of the MIP images confirms the above findings Study findings discussed by telephone at the time of interpretation on 04/07/2023 at 1:20 pm with provider ASHISH ARORA. CTA NECK FINDINGS Aortic arch: Standard aortic branching. The visualized thoracic aorta is normal in caliber. Streak/beam hardening artifact arising from a dense contrast bolus partially obscures the left subclavian  artery. Within this limitation, there is no appreciable hemodynamically significant innominate or proximal subclavian artery stenosis. Right carotid system: CCA and ICA patent within the neck without stenosis or significant atherosclerotic disease. Left carotid system: CCA and ICA patent within the neck. Soft plaque and/or carotid web within the carotid bulb resulting in less than 50% stenosis. Vertebral arteries: Venous reflux of contrast limits evaluation of the V1 and proximal V2 vertebral arteries bilaterally. Within this limitation, the vertebral arteries are patent within the neck without stenosis or significant atherosclerotic disease. Skeleton: Levocurvature of the lower cervical and visualized upper thoracic spine. Cervical spondylosis. No acute fracture or aggressive osseous lesion. Other neck: Subcentimeter thyroid nodules not meeting consensus criteria for ultrasound follow-up based on size. No follow-up imaging recommended. Reference: J Am Coll Radiol. 2015 Feb;12(2): 143-50. Upper chest: Multiple pleural-based right upper lobe pulmonary nodules measuring up to 14 mm. Review of the MIP images confirms the above findings CTA HEAD FINDINGS Anterior circulation: The intracranial internal carotid arteries are patent. Nonstenotic atherosclerotic plaque within the supraclinoid left ICA. The M1 middle cerebral arteries are patent. Atherosclerotic irregularity of the M2 and more distal MCA vessels, bilaterally. No M2 proximal branch occlusion is identified. The anterior cerebral arteries are patent. Atherosclerotic irregularity of both vessels. Most notably, there are sites of severe stenosis within the left anterior cerebral artery A3 segment. 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 Posterior circulation: New from the prior MRA head of 09/05/2019, the intracranial right vertebral artery is occluded beyond the PICA origin. The intracranial left  vertebral artery is patent. New from the prior MRA, the distal basilar artery is occluded (just beyond the SCA origins). Enhancement is present within the proximal superior cerebellar arteries bilaterally. The left posterior cerebral artery is occluded from its origin. The left posterior communicating artery is diminutive or absent. The right posterior cerebral artery is predominantly (or entirely) fetal in origin, and patent. Severe stenosis within the right PCA P2 segment, unchanged from the prior MRA. Venous sinuses: Within the limitations of contrast timing, no convincing thrombus. Anatomic variants: As described. Review of the MIP images confirms the above findings CT Brain Perfusion Findings: CBF (<30%) Volume: 0mL Perfusion (Tmax>6.0s)  volume: 42mL Mismatch Volume: 42mL Infarction Location:None identified. CTA head Impressions #1 and #2 called by telephone at the time of interpretation on 04/07/2023 at 1:20 pm to provider [facility], who verbally acknowledged these results. IMPRESSION: Non-contrast head CT: 1. Small age-indeterminate infarct within the right cerebellar hemisphere. 2. Background parenchymal atrophy, chronic small vessel ischemic disease and chronic infarcts. CTA neck: 1. The common carotid and internal carotid arteries are patent within the neck. Soft plaque and/or a carotid web within the left carotid bulb, resulting in less than 50% stenosis. 2. Venous reflux of contrast limits evaluation of the bilateral vertebral artery V1 and proximal V2 segments. Within this limitation, the cervical vertebral arteries are patent without stenosis or significant atherosclerotic disease. 3. Multiple pleural-based right upper lobe pulmonary nodules measuring up to 14 mm. Non-contrast chest CT at 3-6 months is recommended. If the nodules are stable at time of repeat CT, then future CT at 18-24 months (from today's scan) is considered optional for low-risk patients, but is recommended for high-risk patients.  This recommendation follows the consensus statement: Guidelines for Management of Incidental Pulmonary Nodules Detected on CT Images: From the Fleischner Society 2017; Radiology 2017; 284:228-243. CTA head: 1. Distal basilar artery occlusion, new from the prior MRA head of 09/05/2019. Additionally, the left posterior cerebral artery is also occluded from its origin. The right posterior cerebral artery is predominantly (or entirely) fetal in origin and patent. 2. 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 3. Background intracranial atherosclerotic disease as described. Most notably, there is an unchanged severe stenosis within the right posterior cerebral artery P2 segment, and there are severe stenoses within the left anterior cerebral artery A3 segment. 4. 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 CT perfusion head: The perfusion software identifies regions of critically hypoperfused parenchyma within the posterior circulation totaling 42 mL (utilizing the Tmax>6 seconds threshold). No core infarct is identified. Reported mismatch volume: 42 mL. Electronically Signed   By: Jackey Loge D.O.   On: 04/07/2023 14:06   CT CEREBRAL PERFUSION W CONTRAST Result Date: 04/07/2023 CLINICAL DATA:  Provided history: Neuro deficit, acute, stroke suspected. Additional history provided: Balance difficulty, slurred speech, lip tingling. EXAM: CT ANGIOGRAPHY HEAD AND NECK CT PERFUSION BRAIN TECHNIQUE: Multidetector CT imaging of the head and neck was performed using the standard protocol during bolus administration of intravenous contrast. Multiplanar CT image reconstructions and MIPs were obtained to evaluate the vascular anatomy. Carotid stenosis measurements (when applicable) are obtained utilizing NASCET criteria, using the distal internal carotid diameter as the denominator. Multiphase CT imaging of the brain was performed following IV bolus contrast injection.  Subsequent parametric perfusion maps were calculated using RAPID software. RADIATION DOSE REDUCTION: This exam was performed according to the departmental dose-optimization program which includes automated exposure control, adjustment of the mA and/or kV according to patient size and/or use of iterative reconstruction technique. CONTRAST:  Administered contrast not known at this time. COMPARISON:  MRI brain and MRA head 09/05/2019. FINDINGS: CT HEAD FINDINGS Brain: Generalized cerebral and cerebellar atrophy. Chronic lacunar infarct within the left external capsule. Advanced patchy and ill-defined hypoattenuation elsewhere within the cerebral white matter, nonspecific but compatible with chronic small vessel disease. Subcentimeter age-indeterminate infarct within the right cerebellar hemisphere (series 1, image 8). Redemonstrated small chronic infarct within the left cerebellar hemisphere. There is no acute intracranial hemorrhage. No demarcated cortical infarct. No extra-axial fluid collection. No evidence of an intracranial mass. No midline shift.  Vascular: No hyperdense vessel.  Atherosclerotic calcifications. Skull: No calvarial fracture or aggressive osseous lesion. Sinuses/Orbits: No mass or acute finding within the imaged orbits. Mild mucosal thickening within the right maxillary sinus. ASPECTS (Alberta Stroke Program Early CT Score) - Ganglionic level infarction (caudate, lentiform nuclei, internal capsule, insula, M1-M3 cortex): 7 - Supraganglionic infarction (M4-M6 cortex): 3 Total score (0-10 with 10 being normal): 10 Review of the MIP images confirms the above findings Study findings discussed by telephone at the time of interpretation on 04/07/2023 at 1:20 pm with provider ASHISH ARORA. CTA NECK FINDINGS Aortic arch: Standard aortic branching. The visualized thoracic aorta is normal in caliber. Streak/beam hardening artifact arising from a dense contrast bolus partially obscures the left subclavian  artery. Within this limitation, there is no appreciable hemodynamically significant innominate or proximal subclavian artery stenosis. Right carotid system: CCA and ICA patent within the neck without stenosis or significant atherosclerotic disease. Left carotid system: CCA and ICA patent within the neck. Soft plaque and/or carotid web within the carotid bulb resulting in less than 50% stenosis. Vertebral arteries: Venous reflux of contrast limits evaluation of the V1 and proximal V2 vertebral arteries bilaterally. Within this limitation, the vertebral arteries are patent within the neck without stenosis or significant atherosclerotic disease. Skeleton: Levocurvature of the lower cervical and visualized upper thoracic spine. Cervical spondylosis. No acute fracture or aggressive osseous lesion. Other neck: Subcentimeter thyroid nodules not meeting consensus criteria for ultrasound follow-up based on size. No follow-up imaging recommended. Reference: J Am Coll Radiol. 2015 Feb;12(2): 143-50. Upper chest: Multiple pleural-based right upper lobe pulmonary nodules measuring up to 14 mm. Review of the MIP images confirms the above findings CTA HEAD FINDINGS Anterior circulation: The intracranial internal carotid arteries are patent. Nonstenotic atherosclerotic plaque within the supraclinoid left ICA. The M1 middle cerebral arteries are patent. Atherosclerotic irregularity of the M2 and more distal MCA vessels, bilaterally. No M2 proximal branch occlusion is identified. The anterior cerebral arteries are patent. Atherosclerotic irregularity of both vessels. Most notably, there are sites of severe stenosis within the left anterior cerebral artery A3 segment. 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 Posterior circulation: New from the prior MRA head of 09/05/2019, the intracranial right vertebral artery is occluded beyond the PICA origin. The intracranial left  vertebral artery is patent. New from the prior MRA, the distal basilar artery is occluded (just beyond the SCA origins). Enhancement is present within the proximal superior cerebellar arteries bilaterally. The left posterior cerebral artery is occluded from its origin. The left posterior communicating artery is diminutive or absent. The right posterior cerebral artery is predominantly (or entirely) fetal in origin, and patent. Severe stenosis within the right PCA P2 segment, unchanged from the prior MRA. Venous sinuses: Within the limitations of contrast timing, no convincing thrombus. Anatomic variants: As described. Review of the MIP images confirms the above findings CT Brain Perfusion Findings: CBF (<30%) Volume: 0mL Perfusion (Tmax>6.0s) volume: 42mL Mismatch Volume: 42mL Infarction Location:None identified. CTA head Impressions #1 and #2 called by telephone at the time of interpretation on 04/07/2023 at 1:20 pm to provider [facility], who verbally acknowledged these results. IMPRESSION: Non-contrast head CT: 1. Small age-indeterminate infarct within the right cerebellar hemisphere. 2. Background parenchymal atrophy, chronic small vessel ischemic disease and chronic infarcts. CTA neck: 1. The common carotid and internal carotid arteries are patent within the neck. Soft plaque and/or a carotid web within the left carotid bulb, resulting in less than  50% stenosis. 2. Venous reflux of contrast limits evaluation of the bilateral vertebral artery V1 and proximal V2 segments. Within this limitation, the cervical vertebral arteries are patent without stenosis or significant atherosclerotic disease. 3. Multiple pleural-based right upper lobe pulmonary nodules measuring up to 14 mm. Non-contrast chest CT at 3-6 months is recommended. If the nodules are stable at time of repeat CT, then future CT at 18-24 months (from today's scan) is considered optional for low-risk patients, but is recommended for high-risk patients.  This recommendation follows the consensus statement: Guidelines for Management of Incidental Pulmonary Nodules Detected on CT Images: From the Fleischner Society 2017; Radiology 2017; 284:228-243. CTA head: 1. Distal basilar artery occlusion, new from the prior MRA head of 09/05/2019. Additionally, the left posterior cerebral artery is also occluded from its origin. The right posterior cerebral artery is predominantly (or entirely) fetal in origin and patent. 2. 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 3. Background intracranial atherosclerotic disease as described. Most notably, there is an unchanged severe stenosis within the right posterior cerebral artery P2 segment, and there are severe stenoses within the left anterior cerebral artery A3 segment. 4. 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 CT perfusion head: The perfusion software identifies regions of critically hypoperfused parenchyma within the posterior circulation totaling 42 mL (utilizing the Tmax>6 seconds threshold). No core infarct is identified. Reported mismatch volume: 42 mL. Electronically Signed   By: Jackey Loge D.O.   On: 04/07/2023 14:06   CT HEAD CODE STROKE WO CONTRAST Result Date: 04/07/2023 CLINICAL DATA:  Provided history: Neuro deficit, acute, stroke suspected. Additional history provided: Balance difficulty, slurred speech, lip tingling. EXAM: CT ANGIOGRAPHY HEAD AND NECK CT PERFUSION BRAIN TECHNIQUE: Multidetector CT imaging of the head and neck was performed using the standard protocol during bolus administration of intravenous contrast. Multiplanar CT image reconstructions and MIPs were obtained to evaluate the vascular anatomy. Carotid stenosis measurements (when applicable) are obtained utilizing NASCET criteria, using the distal internal carotid diameter as the denominator. Multiphase CT imaging of the brain was performed following IV bolus contrast injection.  Subsequent parametric perfusion maps were calculated using RAPID software. RADIATION DOSE REDUCTION: This exam was performed according to the departmental dose-optimization program which includes automated exposure control, adjustment of the mA and/or kV according to patient size and/or use of iterative reconstruction technique. CONTRAST:  Administered contrast not known at this time. COMPARISON:  MRI brain and MRA head 09/05/2019. FINDINGS: CT HEAD FINDINGS Brain: Generalized cerebral and cerebellar atrophy. Chronic lacunar infarct within the left external capsule. Advanced patchy and ill-defined hypoattenuation elsewhere within the cerebral white matter, nonspecific but compatible with chronic small vessel disease. Subcentimeter age-indeterminate infarct within the right cerebellar hemisphere (series 1, image 8). Redemonstrated small chronic infarct within the left cerebellar hemisphere. There is no acute intracranial hemorrhage. No demarcated cortical infarct. No extra-axial fluid collection. No evidence of an intracranial mass. No midline shift. Vascular: No hyperdense vessel.  Atherosclerotic calcifications. Skull: No calvarial fracture or aggressive osseous lesion. Sinuses/Orbits: No mass or acute finding within the imaged orbits. Mild mucosal thickening within the right maxillary sinus. ASPECTS (Alberta Stroke Program Early CT Score) - Ganglionic level infarction (caudate, lentiform nuclei, internal capsule, insula, M1-M3 cortex): 7 - Supraganglionic infarction (M4-M6 cortex): 3 Total score (0-10 with 10 being normal): 10 Review of the MIP images confirms the above findings Study findings discussed by telephone at the time of interpretation on 04/07/2023 at 1:20  pm with provider ASHISH ARORA. CTA NECK FINDINGS Aortic arch: Standard aortic branching. The visualized thoracic aorta is normal in caliber. Streak/beam hardening artifact arising from a dense contrast bolus partially obscures the left subclavian  artery. Within this limitation, there is no appreciable hemodynamically significant innominate or proximal subclavian artery stenosis. Right carotid system: CCA and ICA patent within the neck without stenosis or significant atherosclerotic disease. Left carotid system: CCA and ICA patent within the neck. Soft plaque and/or carotid web within the carotid bulb resulting in less than 50% stenosis. Vertebral arteries: Venous reflux of contrast limits evaluation of the V1 and proximal V2 vertebral arteries bilaterally. Within this limitation, the vertebral arteries are patent within the neck without stenosis or significant atherosclerotic disease. Skeleton: Levocurvature of the lower cervical and visualized upper thoracic spine. Cervical spondylosis. No acute fracture or aggressive osseous lesion. Other neck: Subcentimeter thyroid nodules not meeting consensus criteria for ultrasound follow-up based on size. No follow-up imaging recommended. Reference: J Am Coll Radiol. 2015 Feb;12(2): 143-50. Upper chest: Multiple pleural-based right upper lobe pulmonary nodules measuring up to 14 mm. Review of the MIP images confirms the above findings CTA HEAD FINDINGS Anterior circulation: The intracranial internal carotid arteries are patent. Nonstenotic atherosclerotic plaque within the supraclinoid left ICA. The M1 middle cerebral arteries are patent. Atherosclerotic irregularity of the M2 and more distal MCA vessels, bilaterally. No M2 proximal branch occlusion is identified. The anterior cerebral arteries are patent. Atherosclerotic irregularity of both vessels. Most notably, there are sites of severe stenosis within the left anterior cerebral artery A3 segment. 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 Posterior circulation: New from the prior MRA head of 09/05/2019, the intracranial right vertebral artery is occluded beyond the PICA origin. The intracranial left  vertebral artery is patent. New from the prior MRA, the distal basilar artery is occluded (just beyond the SCA origins). Enhancement is present within the proximal superior cerebellar arteries bilaterally. The left posterior cerebral artery is occluded from its origin. The left posterior communicating artery is diminutive or absent. The right posterior cerebral artery is predominantly (or entirely) fetal in origin, and patent. Severe stenosis within the right PCA P2 segment, unchanged from the prior MRA. Venous sinuses: Within the limitations of contrast timing, no convincing thrombus. Anatomic variants: As described. Review of the MIP images confirms the above findings CT Brain Perfusion Findings: CBF (<30%) Volume: 0mL Perfusion (Tmax>6.0s) volume: 42mL Mismatch Volume: 42mL Infarction Location:None identified. CTA head Impressions #1 and #2 called by telephone at the time of interpretation on 04/07/2023 at 1:20 pm to provider [facility], who verbally acknowledged these results. IMPRESSION: Non-contrast head CT: 1. Small age-indeterminate infarct within the right cerebellar hemisphere. 2. Background parenchymal atrophy, chronic small vessel ischemic disease and chronic infarcts. CTA neck: 1. The common carotid and internal carotid arteries are patent within the neck. Soft plaque and/or a carotid web within the left carotid bulb, resulting in less than 50% stenosis. 2. Venous reflux of contrast limits evaluation of the bilateral vertebral artery V1 and proximal V2 segments. Within this limitation, the cervical vertebral arteries are patent without stenosis or significant atherosclerotic disease. 3. Multiple pleural-based right upper lobe pulmonary nodules measuring up to 14 mm. Non-contrast chest CT at 3-6 months is recommended. If the nodules are stable at time of repeat CT, then future CT at 18-24 months (from today's scan) is considered optional for low-risk patients, but is recommended for high-risk patients.  This recommendation follows  the consensus statement: Guidelines for Management of Incidental Pulmonary Nodules Detected on CT Images: From the Fleischner Society 2017; Radiology 2017; 284:228-243. CTA head: 1. Distal basilar artery occlusion, new from the prior MRA head of 09/05/2019. Additionally, the left posterior cerebral artery is also occluded from its origin. The right posterior cerebral artery is predominantly (or entirely) fetal in origin and patent. 2. 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 3. Background intracranial atherosclerotic disease as described. Most notably, there is an unchanged severe stenosis within the right posterior cerebral artery P2 segment, and there are severe stenoses within the left anterior cerebral artery A3 segment. 4. 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 CT perfusion head: The perfusion software identifies regions of critically hypoperfused parenchyma within the posterior circulation totaling 42 mL (utilizing the Tmax>6 seconds threshold). No core infarct is identified. Reported mismatch volume: 42 mL. Electronically Signed   By: Jackey Loge D.O.   On: 04/07/2023 14:06       HISTORY OF PRESENT ILLNESS 63 y.o. patient with history of hypertension, hyperlipidemia, recurrent DVT on Xarelto, lymphoma, strokes admitted for slurred speech.   HOSPITAL COURSE  Stroke:  left CR infarct likely secondary to small vessel disease source CT old right cerebellar infarct CTA head and neck left ICA atherosclerosis, distal BA occlusion, left PCA occlusion, right fetal PCA.  Right V4 occlusion, right P2 and left A3 severe stenosis.  4 mm left A1/A2 aneurysm. CTP 0/42 cc MRI left CR infarct MRA Acute infarct in the left corona radiata. 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 2D Echo EF 55 to 60% LDL 120 HgbA1c 9.3 UDS negative Eliquis for VTE prophylaxis aspirin 81 mg daily and Xarelto (rivaroxaban) daily prior to admission, now on aspirin 81 mg daily and Eliquis (apixaban) daily.  Patient counseled to be compliant with his antithrombotic medications Ongoing aggressive stroke risk factor management Therapy recommendations: Home Health Disposition: Home with home health    History of stroke 10/2014 admitted for diplopia.  MRI negative.  MRI showed 4 mm ACOM aneurysm 09/2019 admitted for left-sided ataxia, unsteady gait.  MRI showed right AchA infarct, old left cerebellum, left SO, CR, subinsular infarcts.  Has residual unsteady gait 08/2020 follow-up with Dr.Willis's at [facility] for left upper quadrantanopsia, found to be residual deficit from 09/2019 AchA infarct   Posterior circulation atherosclerosis, chronic CTA head and neck showed distal BA occlusion, left PCA occlusion, right fetal PCA.  Right V4 occlusion, right P2 severe stenosis.  No acute infarct at posterior circulation at this time. Likely chronic changes.   Diabetes HgbA1c 9.3 goal < 7.0 Would like to stop oral antidiabetic  CBG monitoring, SSI Diabetic coordinator consulted and put on insulin DM education and close PCP follow up 4u standing with meals   Hypertension Stable on the high end Permissive hypertension (OK if <220/120) for 24-48 hours post stroke and then gradually normalized within 5-7 days. Avoid low BP given basilar artery distal occlusion Long term BP goal 130-150   Hyperlipidemia Home meds: None LDL 128, goal < 70 Now on Lipitor 40 Continue statin at discharge   Other Stroke Risk Factors Recurrent DVT on Xarelto, switch to Eliquis   Other Active Problems Lymphoma   DISCHARGE EXAM  General - Well nourished, well developed, in no apparent distress. Ophthalmologic - fundi not  visualized due to noncooperation. Cardiovascular - Regular rhythm and  rate.   Mental Status -  Level of arousal and orientation to time, place, and person were intact. Language including expression, naming, repetition, comprehension was assessed and found intact. Moderate to severe dysarthria Fund of Knowledge was assessed and was intact.   Cranial Nerves II - XII - II - Visual field intact OU. III, IV, VI - Extraocular movements intact. V - Facial sensation intact bilaterally. VII - mild left facial droop VIII - Hearing & vestibular intact bilaterally. X - Palate elevates symmetrically. Moderate to severe dysarthria XI - Chin turning & shoulder shrug intact bilaterally. XII - Tongue protrusion intact.   Motor Strength - The patient's strength was normal in all extremities and pronator drift was absent.  Bulk was normal and fasciculations were absent   Motor Tone - Muscle tone was assessed at the neck and appendages and was normal. Sensory - Light touch, temperature/pinprick were assessed and were symmetrical.   Coordination - The patient had normal movements in the hands and feet with no ataxia or dysmetria.  Tremor was absent. Gait and Station - deferred.  1a Level of Conscious.: 0 1b LOC Questions: 0 1c LOC Commands: 0 2 Best Gaze: 0 3 Visual: 0 4 Facial Palsy: 1 5a Motor Arm - left: 0 5b Motor Arm - Right: 0 6a Motor Leg - Left: 0 6b Motor Leg - Right: 0 7 Limb Ataxia: 0 8 Sensory: 0 9 Best Language: 1 10 Dysarthria: 2 11 Extinct. and Inatten.: 0 TOTAL: 4   Discharge Diet       Diet   Diet regular Room service appropriate? Yes; Fluid consistency: Thin   liquids  DISCHARGE PLAN Disposition: Home with Home Health aspirin 81 mg daily and Eliquis (apixaban) daily for secondary stroke prevention Ongoing stroke risk factor control by Primary Care Physician at time of discharge Follow-up PCP Alysia Penna, MD in 2 weeks. Follow-up in [facility] in 8 weeks, office to schedule an appointment. Able to  see NP in clinic.  35 minutes were spent preparing discharge.  Patient seen and examined by NP/APP with MD. MD to update note as needed.   Elmer Picker, DNP, FNP-BC [facility] Pager: [phone]  ATTENDING NOTE: I reviewed above note and agree with the assessment and plan. Pt was seen and examined.   Mom at bedside.  Patient still has moderate dysarthria, but no arm or leg weakness.  PT and OT and speech explained home health.  Patient is eager to go home.  Had a diabetic coordinator consultation and put on insulin now.  Patient willing to take insulin at home.  Continue aspirin, Eliquis and statin.  Medically ready for discharge.  Follow-up at [facility].  For detailed assessment and plan, please refer to above/below as I have made changes wherever appropriate.   Marvel Plan, MD PhD Stroke Neurology 04/09/2023 11:19 PM

## 2023-04-09 NOTE — Progress Notes (Signed)
 Discharge instructions (including medications) discussed with and copy provided to patient and caregiver. Verbalized understanding.  PIV removed and patient assisted with dressing once girlfriend arrived with clothing. Girlfriend Maralyn Sago also reviewed AVS with primary RN.   TOC medications brought to bedside.

## 2023-04-09 NOTE — Evaluation (Addendum)
 Speech Language Pathology Evaluation Patient Details Name: [name] MRN: [id number] DOB: 11/10/1960 Today's Date: 04/09/2023 Time: 4098-1191 SLP Time Calculation (min) (ACUTE ONLY): 22 min  Problem List:  Patient Active Problem List   Diagnosis Date Noted   Basilar artery stenosis 04/07/2023   Diabetes mellitus (HCC) 09/05/2019   Acute lower UTI 09/05/2019   Ataxia 09/04/2019   Acute cystitis with hematuria    Hyperlipidemia 08/21/2014   Chest pain 08/21/2014   Bronchitis 05/08/2014   Candida rash of groin 05/08/2014   HTN (hypertension), benign 08/31/2011   Polycythemia, secondary 08/31/2011   Abnormal liver function test 08/31/2011   Burkitt's lymphoma (HCC) 02/23/2011   Past Medical History:  Past Medical History:  Diagnosis Date   Abnormal liver function test 08/31/2011   Mild elevation bilirubin, normal/high hemoglobin, normal LDH   Adult ADHD    Aneurysm (HCC) 10/23/2014   4mm left anterior communicating artery region, noted on MRI head   Anxiety    Burkitt's lymphoma (HCC)    followed by Dr. Mancel Bale   Cerebellar infarct [facility]) 10/23/2014   Chronic, noted on MRI head   Chest pain    Depression    DVT (deep venous thrombosis) (HCC) 12/2015   Left leg   Elevated PSA    Glaucoma    Headache    History of double vision 2016   HTN (hypertension), benign 08/31/2011   Hyperlipidemia    Hypotestosteronemia    Polycythemia, secondary 08/31/2011   Likely due to testosterone injections   Stroke (HCC)    White matter disease 10/23/2014   noted on MRI head, "extensive"   Past Surgical History:  Past Surgical History:  Procedure Laterality Date   CEREBRAL ANGIOGRAM  11/23/2014   HERNIA REPAIR     KNEE SURGERY Left    SHOULDER SURGERY     HPI:  The pt is a 63 yo presenting 3/5 with balance deficits and slurred speech. Work up revealed distal basilar occlusion with intracranial atherosclerotic disease, transferred to [facility] for observation and possible  thrombectomy. MRI showed acute infarct in the left corona radiata. PMH includes: HLD, HTN, DVT on Xarelto, Burkitts lymphoma, and prior L cerebellar stroke with baseline dysequilibrium.   Assessment / Plan / Recommendation Clinical Impression  Pt believes that he is at his baseline with everything except for his speech. Education offered about recommendations from PT/OT for f/u therapy but he does not believe he needs it. No family present through most of the eval to confirm previous level of function, but depending on confirmation of PLOF, question recall of information from therapy sessions and/or awareness. Note that pt denies needing any assistance at home post-discharge. SLP initiated the SLUMS with pt achieving 10 out of 11 possible points before he wanted to stop testing. This may have been related to mother's arrival but he indicated that this was related to frustration in hearing himself talk. Pt does have dysarthria characterized by low volume and imprecise articulation. SLP provided education about speech intelligibility strategies and pt started implementing over-articulation and slower rate with improved intelligibility. SLP also turned TV off to modify environment. Pt is agreeable to SLP f/u for ongoing speech intervention as well as differential diagnosis of cognitive abilities.     SLP Assessment  SLP Recommendation/Assessment: Patient needs continued Speech Lanaguage Pathology Services SLP Visit Diagnosis: Dysarthria and anarthria (R47.1)    Recommendations for follow up therapy are one component of a multi-disciplinary discharge planning process, led by the attending physician.  Recommendations  may be updated based on patient status, additional functional criteria and insurance authorization.    Follow Up Recommendations  Acute inpatient rehab (3hours/day) (if pt declines AIR, recommend [facility] SLP)   Assistance Recommended at Discharge  Intermittent Supervision/Assistance  Functional  Status Assessment Patient has had a recent decline in their functional status and demonstrates the ability to make significant improvements in function in a reasonable and predictable amount of time.  Frequency and Duration min 2x/week  2 weeks      SLP Evaluation Cognition  Overall Cognitive Status: No family/caregiver present to determine baseline cognitive functioning Arousal/Alertness: Awake/alert Orientation Level: Oriented X4 Attention: Sustained Sustained Attention: Appears intact Memory: Impaired Memory Impairment: Decreased recall of new information Awareness: Impaired Awareness Impairment: Intellectual impairment       Comprehension  Auditory Comprehension Overall Auditory Comprehension: Appears within functional limits for tasks assessed    Expression Expression Primary Mode of Expression: Verbal Verbal Expression Overall Verbal Expression: Appears within functional limits for tasks assessed   Oral / Motor  Motor Speech Overall Motor Speech: Impaired Respiration: Within functional limits Phonation: Low vocal intensity Resonance: Within functional limits Articulation: Impaired Level of Impairment: Conversation Intelligibility: Intelligibility reduced Conversation: 75-100% accurate Effective Techniques: Slow rate;Increased vocal intensity;Over-articulate            Mahala Menghini., M.A. CCC-SLP Acute Rehabilitation Services Office [phone]  Secure chat preferred  04/09/2023, 12:36 PM

## 2023-04-09 NOTE — TOC Transition Note (Addendum)
 Transition of Care [facility]) - Discharge Note   Patient Details  Name: [name] MRN: [id number] Date of Birth: [birth year]  [facility] CM/SW Contact:  Leone Haven, RN Phone Number: 04/09/2023, 3:57 PM   Clinical Narrative:    For dc today, he is set up with [facility] speech with [facility].  NCM asked MD for order. MD ordered HHPT, HHST, HHOT.  Clifton Custard with [facility] is aware.  Eliquis copay for refill 35.00.          Patient Goals and CMS Choice            Discharge Placement                       Discharge Plan and Services Additional resources added to the After Visit Summary for                                       Social Drivers of Health (SDOH) Interventions SDOH Screenings   Food Insecurity: No Food Insecurity (04/07/2023)  Housing: Low Risk  (04/07/2023)  Transportation Needs: No Transportation Needs (04/07/2023)  Utilities: Not At Risk (04/07/2023)  Tobacco Use: Low Risk  (04/07/2023)     Readmission Risk Interventions     No data to display

## 2023-04-12 LAB — JAK2 GENOTYPR

## 2023-05-11 ENCOUNTER — Ambulatory Visit (INDEPENDENT_AMBULATORY_CARE_PROVIDER_SITE_OTHER): Admitting: Adult Health

## 2023-05-11 ENCOUNTER — Encounter: Payer: Self-pay | Admitting: Adult Health

## 2023-05-11 ENCOUNTER — Telehealth: Payer: Self-pay | Admitting: Adult Health

## 2023-05-11 VITALS — BP 134/89 | HR 78 | Ht 73.0 in | Wt 213.0 lb

## 2023-05-11 DIAGNOSIS — Z09 Encounter for follow-up examination after completed treatment for conditions other than malignant neoplasm: Secondary | ICD-10-CM | POA: Diagnosis not present

## 2023-05-11 DIAGNOSIS — E1100 Type 2 diabetes mellitus with hyperosmolarity without nonketotic hyperglycemic-hyperosmolar coma (NKHHC): Secondary | ICD-10-CM | POA: Diagnosis not present

## 2023-05-11 DIAGNOSIS — I639 Cerebral infarction, unspecified: Secondary | ICD-10-CM | POA: Diagnosis not present

## 2023-05-11 DIAGNOSIS — Z7984 Long term (current) use of oral hypoglycemic drugs: Secondary | ICD-10-CM | POA: Diagnosis not present

## 2023-05-11 NOTE — Patient Instructions (Addendum)
 Continue working with therapies for hopeful ongoing recovery. Once home health therapies are completed and interested in additional therapy, please let me know  Continue aspirin 81 mg daily  and atorvastatin for secondary stroke prevention  Continue Eliquis for history of DVT  Referral placed to endocrinology for further diabetic management and recommendations   Continue to follow up with PCP regarding blood pressure, cholesterol and diabetes management  Maintain strict control of hypertension with blood pressure goal between 130-150 to ensure adequate perfusion, diabetes with hemoglobin A1c goal below 7.0 % and cholesterol with LDL cholesterol (bad cholesterol) goal below 70 mg/dL.   Signs of a Stroke? Follow the BEFAST method:  Balance Watch for a sudden loss of balance, trouble with coordination or vertigo Eyes Is there a sudden loss of vision in one or both eyes? Or double vision?  Face: Ask the person to smile. Does one side of the face droop or is it numb?  Arms: Ask the person to raise both arms. Does one arm drift downward? Is there weakness or numbness of a leg? Speech: Ask the person to repeat a simple phrase. Does the speech sound slurred/strange? Is the person confused ? Time: If you observe any of these signs, call [phone].        Thank you for coming to see [place] at [facility]. I hope we have been able to provide you high quality care today.  You may receive a patient satisfaction survey over the next few weeks. We would appreciate your feedback and comments so that we may continue to improve ourselves and the health of our patients.

## 2023-05-11 NOTE — Telephone Encounter (Signed)
 Referral for endocrinology fax to [facility]. Phone: [phone], Fax: [phone]

## 2023-05-11 NOTE — Progress Notes (Signed)
 [facility] [address] [place]. [place] [address] [phone]       HOSPITAL FOLLOW UP NOTE  [name] Date of Birth:  12/18/1960 Medical Record Number:  [id number]   Reason for Referral:  hospital stroke follow up    SUBJECTIVE:   CHIEF COMPLAINT:  Chief Complaint  Patient presents with   Cerebrovascular Accident    Rm 3 alone Pt is well, reports he is having speech impairment otherwise he is stable. No there concerns.  Currently doing PT and ST.     HPI:   [name] is a 63 y.o. patient with history of hypertension, hyperlipidemia, recurrent DVT on Xarelto, lymphoma, and hx of stroke who presented to ED on 04/07/2023 with slurred speech.  Stroke workup revealed left CR infarct likely secondary to small vessel disease.  CTA head/neck showed diffuse intracranial stenosis, left ICA arthrosclerosis, distal  BA occlusion and 4 mm left A1/A2 aneurysm.  LDL 120.  A1c 9.3.  On aspirin and Xarelto PTA and switched to aspirin and Eliquis and added atorvastatin 40 mg daily.  Patient placed on insulin and advised close PCP follow-up for uncontrolled DM.  Recommended long-term BP goal 130-150 to ensure adequate perfusion. Hx of R AchA stroke 2021 as well as evidence of chronic left cerebellum, left SO, CR and subinsular infarcts.  Therapies recommended home health therapy.   Today, 05/11/2023, patient is being seen for initial hospital follow-up unaccompanied.  He reports residual slurred speech.  He has not seen any significant improvement but notes family and friends have seen improvement.  Currently working with home health SLP.  Some residual mild gait impairment which has improved since discharge, will likely complete home health PT today.  He does carry a cane with him to use just as needed.  He did have a fall about 2 weeks ago thankfully without significant injury.  No new stroke/TIA symptoms.  Remains on aspirin, Eliquis and atorvastatin, denies side  effects.  Routinely follows with PCP for stroke risk factor management.  He has been having difficulty controlling glucose levels, can be significantly elevated in the morning, typically between 300-400, currently on insulin.  He has never previously seen endocrinology.      PERTINENT IMAGING  CT old right cerebellar infarct CTA head and neck left ICA atherosclerosis, distal BA occlusion, left PCA occlusion, right fetal PCA.  Right V4 occlusion, right P2 and left A3 severe stenosis.  4 mm left A1/A2 aneurysm. CTP 0/42 cc MRI left CR infarct MRA Acute infarct in the left corona radiata. 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 2D Echo EF 55 to 60% LDL 120 HgbA1c 9.3 UDS negative    ROS:   14 system review of systems performed and negative with exception of those listed in HPI  PMH:  Past Medical History:  Diagnosis Date   Abnormal liver function test 08/31/2011   Mild elevation bilirubin, normal/high hemoglobin, normal LDH   Adult ADHD    Aneurysm (HCC) 10/23/2014   4mm left anterior communicating artery region, noted on MRI head   Anxiety    Burkitt's lymphoma (HCC)    followed by Dr. Mancel Bale   Cerebellar infarct [facility]) 10/23/2014   Chronic, noted on MRI head   Chest pain    Depression    DVT (deep venous thrombosis) (HCC) 12/2015   Left leg  Elevated PSA    Glaucoma    Headache    History of double vision 2016   HTN (hypertension), benign 08/31/2011   Hyperlipidemia    Hypotestosteronemia    Polycythemia, secondary 08/31/2011   Likely due to testosterone injections   Stroke (HCC)    White matter disease 10/23/2014   noted on MRI head, "extensive"    PSH:  Past Surgical History:  Procedure Laterality Date   CEREBRAL ANGIOGRAM  11/23/2014   HERNIA REPAIR     KNEE SURGERY Left    SHOULDER  SURGERY      Social History:  Social History   Socioeconomic History   Marital status: Married    Spouse name: Not on file   Number of children: Not on file   Years of education: Not on file   Highest education level: Not on file  Occupational History   Not on file  Tobacco Use   Smoking status: Never   Smokeless tobacco: Never  Vaping Use   Vaping status: Never Used  Substance and Sexual Activity   Alcohol use: Yes    Alcohol/week: 0.0 standard drinks of alcohol    Comment: once a week    Drug use: No   Sexual activity: Not Currently  Other Topics Concern   Not on file  Social History Narrative   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    Married, no children.    Caffeine 2 cups am, tea glasses daily (unsweetened).   Social Drivers of Corporate investment banker Strain: Not on file  Food Insecurity: No Food Insecurity (04/07/2023)   Hunger Vital Sign    Worried About Running Out of Food in the Last Year: Never true    Ran Out of Food in the Last Year: Never true  Transportation Needs: No Transportation Needs (04/07/2023)   PRAPARE - Administrator, Civil Service (Medical): No    Lack of Transportation (Non-Medical): No  Physical Activity: Not on file  Stress: Not on file  Social Connections: Not on file  Intimate Partner Violence: Not At Risk (04/07/2023)   Humiliation, Afraid, Rape, and Kick questionnaire    Fear of Current or Ex-Partner: No    Emotionally Abused: No    Physically Abused: No    Sexually Abused: No    Family History:  Family History  Problem Relation Age of Onset   Stroke Father        Deceased   Hypertension Mother        Living    Medications:   Current Outpatient Medications on File Prior to Visit  Medication Sig Dispense Refill   apixaban (ELIQUIS) 5 MG TABS tablet Take 1 tablet (5 mg total) by mouth 2 (two) times daily. 60 tablet 0   aspirin EC 81 MG tablet Take 81 mg by mouth daily. Swallow whole.      atorvastatin (LIPITOR) 40 MG tablet Take 1 tablet (40 mg total) by mouth daily. 30 tablet 0   Blood Glucose Monitoring Suppl (BLOOD GLUCOSE MONITOR SYSTEM) w/Device KIT 1 each by Does not apply route 3 (three) times daily. May dispense any manufacturer covered by patient's insurance. 1 kit 0   Continuous Glucose Sensor (DEXCOM G7 SENSOR) MISC Change every 10 days to monitor blood glucose continuously     dorzolamide-timolol (COSOPT) 22.3-6.8 MG/ML ophthalmic solution Place 1 drop into the right eye 2 (two) times daily.     Glucose Blood (BLOOD GLUCOSE TEST STRIPS) STRP  Use 1 each 3 (three) times daily as directed to check blood sugar. May dispense any manufacturer covered by patient's insurance and fits patient's device. 100 strip 0   Insulin Pen Needle 32G X 4 MM MISC Use 1 each 3 (three) times daily. May dispense any manufacturer covered by patient's insurance. 100 each 0   JARDIANCE 10 MG TABS tablet Take 10 mg by mouth daily.     LACTASE ENZYME PO Take by mouth. *natural lactase enzyme* 3000 fcc alu     Lancet Device MISC 1 each by Does not apply route 3 (three) times daily. May dispense any manufacturer covered by patient's insurance. 1 each 0   Lancets (ONETOUCH DELICA PLUS LANCET33G) MISC 1 each 3 (three) times daily. Use as directed to check blood sugar. May dispense any manufacturer covered by patient's insurance and fits patient's device. 100 each 0   linagliptin (TRADJENTA) 5 MG TABS tablet Take 1 tablet (5 mg total) by mouth daily. 30 tablet 1   lisinopril (PRINIVIL,ZESTRIL) 40 MG tablet Take 40 mg by mouth daily.      Multiple Vitamin (MULTIVITAMIN WITH MINERALS) TABS tablet Take 1 tablet by mouth daily.     Netarsudil-Latanoprost (ROCKLATAN) 0.02-0.005 % SOLN Apply to eye.     omeprazole (PRILOSEC) 20 MG capsule Take 20 mg by mouth daily.     Probiotic Product (PROBIOTIC PO) Take by mouth. *10 strain probiotic* contains 10 strains of beneficial bacteria with 10 mg b infantis      sildenafil (VIAGRA) 100 MG tablet Take 100 mg by mouth daily as needed for erectile dysfunction.      tamsulosin (FLOMAX) 0.4 MG CAPS capsule Take 0.4 mg by mouth daily.     testosterone cypionate (DEPOTESTOSTERONE CYPIONATE) 200 MG/ML injection Inject 200 mg into the muscle every 14 (fourteen) days.     valACYclovir (VALTREX) 1000 MG tablet Take 1,000 mg by mouth daily.     No current facility-administered medications on file prior to visit.    Allergies:   Allergies  Allergen Reactions   Codeine Nausea Only   Hydrocodone-Acetaminophen Other (See Comments)    Makes patient sick   Vicodin [Hydrocodone-Acetaminophen] Nausea And Vomiting      OBJECTIVE:  Physical Exam  Vitals:   05/11/23 0913  BP: 134/89  Pulse: 78  Weight: 213 lb (96.6 kg)  Height: 6\' 1"  (1.854 m)   Body mass index is 28.1 kg/m. No results found.   General: well developed, well nourished, very pleasant middle-age Caucasian male, seated, in no evident distress  Neurologic Exam Mental Status: Awake and fully alert. Moderate dysarthria. No evidence of aphasia.  Oriented to place and time. Recent and remote memory intact. Attention span, concentration and fund of knowledge appropriate. Mood and affect appropriate.  Cranial Nerves: Pupils equal, briskly reactive to light. Extraocular movements full without nystagmus. Visual fields full to confrontation. Hearing intact. Facial sensation intact. Left lower facial weakness Motor: Normal bulk and tone. Normal strength in all tested extremity muscles Sensory.: intact to touch , pinprick , position and vibratory sensation.  Coordination: Rapid alternating movements normal in all extremities. Finger-to-nose and heel-to-shin performed accurately bilaterally. Gait and Station: Arises from chair without difficulty. Stance is normal. Gait demonstrates normal stride length with mild imbalance/unsteadiness, carrying cane.  Tandem walk and heel toe not attempted Reflexes: 1+  and symmetric. Toes downgoing.     NIHSS  3 Modified Rankin  2      ASSESSMENT: [name] is a 63 y.o. year  old male with left CR infarct in 04/2023 likely secondary to small vessel disease. Vascular risk factors include history of stroke, chronic posterior circulation arthrosclerosis, uncontrolled DM, HTN, HLD and recurrent DVTs.      PLAN:  Left CR stroke:  Residual deficit: Moderate dysarthria and gait impairment.  Continue working with home health therapies, consider additional outpatient therapies once completed. Continue aspirin 81mg  daily and atorvastatin (Lipitor) for secondary stroke prevention managed/prescribed by PCP.  Was switched from Xarelto to Eliquis for recurrent DVT managed by PCP Referral placed to endocrinology for further diabetic management in setting of recent stroke Discussed secondary stroke prevention measures and importance of close PCP follow up for aggressive stroke risk factor management including BP goal 130-150, HLD with LDL goal<70 and DM with A1c.<7 .  Stroke labs 04/2023: LDL 128, A1c 9.3 I have gone over the pathophysiology of stroke, warning signs and symptoms, risk factors and their management in some detail with instructions to go to the closest emergency room for symptoms of concern. Cerebral aneurysm: Stable 4mm aneurysm per monitoring since 2016 imaging. Followed by [facility]/neurosurgery.      No further recommendations from stroke standpoint and is routinely followed by PCP.  Recommend follow-up on an as-needed basis.  He verbalized understanding and agrees with plan.   CC:  [facility] provider: Dr. Pearlean Brownie PCP: Patient, No Pcp Per    I spent 55 minutes of face-to-face and non-face-to-face time with patient.  This included previsit chart review including extensive review of hospitalization, lab review, study review, order entry, electronic health record documentation, patient education and discussion regarding above diagnoses and treatment plan  and answered all other questions to patient's satisfaction  Ihor Austin, [facility]  [facility] [address] [place], [place] [address]  Phone [phone] Fax [phone] Note: This document was prepared with digital dictation and possible smart phrase technology. Any transcriptional errors that result from this process are unintentional.

## 2023-05-14 NOTE — Progress Notes (Signed)
 I agree with the above plan

## 2023-09-20 NOTE — Progress Notes (Signed)
 Date of Service: 09/20/2023 Patient DOB: 1960/10/08   Subjective:     Patient ID: [name] is a 63 y.o. male. 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 that caused dysarthria and gait impalance; had a prior cva in 03/24, on Eliquis  and asa 81mg  daily, was seeing [facility] neurology; uses cane with ambulation;     - Diabetes mellitus type 2: 03/25 was started on Lantus  24units daily and lispro short acting (cut not doing the scale, pt eats first and then checks after eating if it is over 100, pt will give himself 10 units); pt did not want to start short acting insulin  at last apt, was ref to endocrinology with apt on 10/20/23        A1C:    Lab Results   Component Value Date   HGBA1C 8.5 (H) 12/23/2022           Lipids:    Lab Results   Component Value Date   CHOL 227 (H) 12/23/2022       Lab Results   Component Value Date   HDL 33 (L) 12/23/2022       Lab Results   Component Value Date   LDLCALC 141 (H) 12/23/2022       Lab Results   Component Value Date   TRIG 347 (H) 12/23/2022       No results found for: CHOLHDL       Microalbumin:   No results found for: LABPROT           Blood sugars: has not been checking his fasting blood sugars but will check his blood sugars after eating and if he notices if his blood sugars start to increase, will give himself 10 units of the short acting and rarely lets it get over 150;;  only one low of 53;  Eye exam: 2025, Dr. [place]   Foot exam: 07/05/23           - Hypertension: currenlty on amlodipine 5mg  daily and lisinopril  40mg  daily                 - Dyslipidemia: on lipitor 40mg  daily                -Low testosterone : On  testosterone  replacement of 150 mg every 2 weeks, has had issues with polycythemia in the past; give on Fri or Sat. 06/25 PSA level was stable, Testosterone  level came back elevated (1022) and testosterone  was decreased to 150mg  q2 weeks.         -History of blood clots: Currently on Eliquis , 2017 left lower extremity DVT                   -BPH: Currently on Flomax  0.4 mg daily                   -Burkitt's lymphoma: Was treated with chemotherapy and is in remission: Was seeing Dr. Arley Hof with oncology                   -Glaucoma: on eye drops, see eye doctor                  -Elevated bilirubin                   -  GERD: Currently on omeprazole 20 mg daily                   -gential herpes : was on Valtrex  1000 mg daily until it clears up and has not had a flare-up for years, normally takes 1-2 weeks;                  -Thrombopenia:          -depression: frustrated after stroke as he cannot do what he used to, no thoughts about hurting himself or others;    07/05/2023 started on sertraline 50 mg daily  09/20/23 mood is controlled on Sertraline 50mg  daily;            Labs:   06/25  thyroid  lab was normal.  Patient CMP had slightly low platelets (141 from 170) and will continue to observe.  Patient's cholesterol was mildly abnormal (elevated triglycerides at 377, elevated LDL 125, low HDL 35) where he restarted his Lipitor 40mg  daily  Patient CMP lab had moderately elevated blood sugars (321) and patient's A1c is still elevated at 10.0. PSA level was stable, Testosterone  level came back elevated (1022) and testosterone  was decreased to 150mg  q2 weeks.       03/25 BMP had elevated blood sugar 138, CBC had low platelets of 135 from 150, lipids had elevated triglycerides at 250, low HDL of 25, LDL of 120, A1c of 9.3, negative alcohol , negative UDS         11/24 normal testosterone  is 619, normal PSA of 4.3, CMP had elevated blood  sugar of 104, elevated total bilirubin 1.3, lipids had elevated triglycerides at 347, elevated LDL 141, CBC is normal, A1c of 8.5                  Misc:    Review of Systems Review of Systems  Constitutional:  Negative for chills, fatigue and fever.  HENT:  Negative for congestion, hearing loss, rhinorrhea and sore throat.   Eyes:  Negative for pain and visual disturbance.  Respiratory:  Negative for cough, shortness of breath and wheezing.   Cardiovascular:  Negative for chest pain, palpitations and leg swelling.  Gastrointestinal:  Negative for abdominal distention, abdominal pain, anal bleeding, blood in stool, constipation, diarrhea, nausea, rectal pain and vomiting.  Genitourinary:  Negative for decreased urine volume, dysuria and hematuria.  Musculoskeletal:  Negative for arthralgias, back pain, gait problem, joint swelling, myalgias, neck pain and neck stiffness.  Skin:  Negative for rash and wound.  Neurological:  Negative for dizziness, seizures, syncope, weakness and headaches.  Hematological:  Negative for adenopathy. Does not bruise/bleed easily.  Psychiatric/Behavioral:  Negative for agitation, behavioral problems, sleep disturbance and suicidal ideas. The patient is not nervous/anxious.      Medical History[1]  Surgical History[2]    Allergies[3]    Social History   Social History Narrative  . Not on file    Family History[4]    Objective:  BP (!) 155/98   Pulse 78   Temp 98.3 F (36.8 C)   Resp 16   Ht 1.854 m (6' 1)   Wt 98.9 kg (218 lb)   SpO2 96%   BMI 28.76 kg/m   Physical Exam Physical Exam Constitutional:      General: He is not in acute distress.    Appearance: Normal appearance. He is not ill-appearing.  HENT:     Head: Normocephalic and atraumatic.     Right Ear: Tympanic membrane, ear  canal and external ear normal. There is no impacted cerumen.     Left Ear: Tympanic membrane, ear canal and external ear normal. There is no  impacted cerumen.     Nose: Nose normal. No congestion or rhinorrhea.     Mouth/Throat:     Mouth: Mucous membranes are moist.     Pharynx: No oropharyngeal exudate or posterior oropharyngeal erythema.   Eyes:     General: No scleral icterus.       Right eye: No discharge.        Left eye: No discharge.     Extraocular Movements: Extraocular movements intact.     Conjunctiva/sclera: Conjunctivae normal.     Pupils: Pupils are equal, round, and reactive to light.   Neck:     Vascular: No carotid bruit.   Cardiovascular:     Rate and Rhythm: Normal rate and regular rhythm.     Heart sounds: No murmur heard. Pulmonary:     Effort: Pulmonary effort is normal. No respiratory distress.     Breath sounds: Normal breath sounds. No stridor. No wheezing, rhonchi or rales.  Chest:     Chest wall: No tenderness.  Abdominal:     General: There is no distension.     Palpations: Abdomen is soft. There is no mass.     Tenderness: There is no abdominal tenderness. There is no right CVA tenderness, left CVA tenderness, guarding or rebound.     Hernia: A hernia is present.     Comments: Mild nonpainful reducible umbilical hernia noted.   Musculoskeletal:        General: No swelling, tenderness, deformity or signs of injury.     Cervical back: Neck supple. No tenderness.     Right lower leg: No edema.     Left lower leg: No edema.  Lymphadenopathy:     Cervical: No cervical adenopathy.   Skin:    Coloration: Skin is not jaundiced or pale.     Findings: No bruising, erythema, lesion or rash.   Neurological:     General: No focal deficit present.     Mental Status: He is alert and oriented to person, place, and time.     Cranial Nerves: No cranial nerve deficit.     Motor: No weakness.     Gait: Gait normal.   Psychiatric:        Mood and Affect: Mood normal.        Behavior: Behavior normal.        Thought Content: Thought content normal.        Judgment: Judgment normal.          Labs: No results found for this or any previous visit (from the past week).  Assessment/Plan:  1. Pre-operative clearance (Primary) Obtain labs. If cleared for surgery, you will need to hold the aspirin  7 days before surgery and the apixaban  (eliquis ) should be held 2 days before surgery and may resumed once okay by your surgeon.   2. H/O: CVA (cerebrovascular accident) He has slight delayed speech as the only neurologic deficit at this time.  Will continue to work on controlling patient's blood pressure, blood sugars, and cholesterol.  3. Type 2 diabetes mellitus without complication, with long-term current use of insulin     (CMD) Obtain A1c lab now.  Continue Lantus  long-acting insulin  as directed.  Would highly recommend to use lispro short acting insulin  via the insulin  sliding scale.   If blood sugars get below 70, recommend  to eat something, recheck the blood sugar in 5 min, and if blood sugar is not at least in the 90s, repeat cycle. Call if you are getting blood sugars below 70.  Recommend a low fat/carbohydrate diet with increase in exercising.  Follow with your endocrinologist as directed. - Hemoglobin A1C With Estimated Average Glucose - Basic Metabolic Panel  4. Essential hypertension Diastolic blood pressure is slightly elevated.  Continue current medications.  Patient has a home blood pressure cuff and states will start checking his blood pressure. Recommend to check your blood pressure at home a couple a times a week after sitting for 5 minutes and call if the blood pressure gets above 140/90 consistently.    5. Dyslipidemia Please make sure you are taking Lipitor (atorvastatin ) daily for your cholesterol.. Recommend a low fat/carbohydrate diet with increase in exercising.    6. Thrombocytopenia Obtain CBC lab now. - CBC with Differential  7. Umbilical hernia without obstruction and without gangrene Follow-up with general surgeon as directed.  8. Low  testosterone  in male Last testosterone  level was slightly elevated. Continue testosterone  supplement and will obtain testosterone  level at next appointment.  9. Benign prostatic hyperplasia, unspecified whether lower urinary tract symptoms present Controlled, continue Flomax  as directed.  10. Burkitt lymphoma, unspecified body region    (CMD) Continue to follow with your oncologist as directed.  11. GERD without esophagitis Controlled, continue omeprazole as directed.  12. Depression, unspecified depression type Controlled, continue sertraline as directed.  13. History of DVT (deep vein thrombosis) Continue Eliquis  as directed.  Obtain labs (A1c and CBC) now. Follow-up in 3 months or sooner if needed.  Call for any questions/concerns.   Electronically signed by: Shawn P Lazoff, DO 09/20/2023 2:44 PM  This document was created using the aid of voice recognition Dragon dictation software.         [1] Past Medical History: Diagnosis Date  . Cancer    (CMD)    lymphoma  . GERD (gastroesophageal reflux disease) 07/08/2007  . Hyperglycemia 08/26/2011  . Hyperlipidemia   . Hypertension   . Impotence 06/15/2006  . Inguinal hernia without mention of obstruction or gangrene, unilateral or unspecified, (not specified as recurrent) 09/28/2008  . Osteoarthritis of knee 12/12/2010  . Other testicular hypofunction 09/20/2009  . Overweight(278.02)   . Plantar fascial fibromatosis 04/08/2007  . Thoracic or lumbosacral neuritis or radiculitis, unspecified 06/09/2005  . Thrombocytopenia 06/24/2005  . Varicose veins of lower extremities 02/28/2010  . WBC disease   [2] Past Surgical History: Procedure Laterality Date  . HERNIA REPAIR     Procedure: HERNIA REPAIR; 2009  [3] Allergies Allergen Reactions  . Hydrocodone-Acetaminophen  Angioedema    Makes patient sick  . Metoprolol Succinate Other (See Comments)  [4] Family History Problem Relation Name Age of Onset  . Hypertension  Father    . Cancer Maternal Grandmother    . Pulmonary disease Paternal Grandfather    . Hypertension Mother    . Stroke Father

## 2023-10-08 ENCOUNTER — Other Ambulatory Visit: Payer: Self-pay

## 2023-10-08 ENCOUNTER — Emergency Department (HOSPITAL_COMMUNITY)

## 2023-10-08 ENCOUNTER — Encounter (HOSPITAL_COMMUNITY): Payer: Self-pay

## 2023-10-08 ENCOUNTER — Inpatient Hospital Stay (HOSPITAL_COMMUNITY)
Admission: EM | Admit: 2023-10-08 | Discharge: 2023-10-10 | DRG: 872 | Disposition: A | Discharge status: Home / Self Care | Attending: Family Medicine | Admitting: Family Medicine

## 2023-10-08 DIAGNOSIS — C8370 Burkitt lymphoma, unspecified site: Secondary | ICD-10-CM | POA: Diagnosis present

## 2023-10-08 DIAGNOSIS — Z8673 Personal history of transient ischemic attack (TIA), and cerebral infarction without residual deficits: Secondary | ICD-10-CM

## 2023-10-08 DIAGNOSIS — K219 Gastro-esophageal reflux disease without esophagitis: Secondary | ICD-10-CM | POA: Diagnosis present

## 2023-10-08 DIAGNOSIS — I10 Essential (primary) hypertension: Secondary | ICD-10-CM | POA: Diagnosis present

## 2023-10-08 DIAGNOSIS — H409 Unspecified glaucoma: Secondary | ICD-10-CM | POA: Diagnosis present

## 2023-10-08 DIAGNOSIS — N3000 Acute cystitis without hematuria: Principal | ICD-10-CM | POA: Diagnosis present

## 2023-10-08 DIAGNOSIS — E1129 Type 2 diabetes mellitus with other diabetic kidney complication: Secondary | ICD-10-CM

## 2023-10-08 DIAGNOSIS — R531 Weakness: Secondary | ICD-10-CM

## 2023-10-08 DIAGNOSIS — R471 Dysarthria and anarthria: Secondary | ICD-10-CM | POA: Diagnosis present

## 2023-10-08 DIAGNOSIS — N390 Urinary tract infection, site not specified: Secondary | ICD-10-CM | POA: Diagnosis not present

## 2023-10-08 DIAGNOSIS — Z885 Allergy status to narcotic agent status: Secondary | ICD-10-CM

## 2023-10-08 DIAGNOSIS — I69322 Dysarthria following cerebral infarction: Secondary | ICD-10-CM

## 2023-10-08 DIAGNOSIS — E785 Hyperlipidemia, unspecified: Secondary | ICD-10-CM | POA: Diagnosis present

## 2023-10-08 DIAGNOSIS — Z79899 Other long term (current) drug therapy: Secondary | ICD-10-CM

## 2023-10-08 DIAGNOSIS — Z86718 Personal history of other venous thrombosis and embolism: Secondary | ICD-10-CM

## 2023-10-08 DIAGNOSIS — Z794 Long term (current) use of insulin: Secondary | ICD-10-CM

## 2023-10-08 DIAGNOSIS — A419 Sepsis, unspecified organism: Secondary | ICD-10-CM | POA: Diagnosis not present

## 2023-10-08 DIAGNOSIS — I639 Cerebral infarction, unspecified: Secondary | ICD-10-CM | POA: Diagnosis present

## 2023-10-08 DIAGNOSIS — Z7984 Long term (current) use of oral hypoglycemic drugs: Secondary | ICD-10-CM

## 2023-10-08 DIAGNOSIS — Z7901 Long term (current) use of anticoagulants: Secondary | ICD-10-CM

## 2023-10-08 DIAGNOSIS — Z8249 Family history of ischemic heart disease and other diseases of the circulatory system: Secondary | ICD-10-CM

## 2023-10-08 DIAGNOSIS — Z823 Family history of stroke: Secondary | ICD-10-CM

## 2023-10-08 DIAGNOSIS — Z1152 Encounter for screening for COVID-19: Secondary | ICD-10-CM

## 2023-10-08 DIAGNOSIS — F32A Depression, unspecified: Secondary | ICD-10-CM | POA: Diagnosis present

## 2023-10-08 DIAGNOSIS — Z7982 Long term (current) use of aspirin: Secondary | ICD-10-CM

## 2023-10-08 DIAGNOSIS — E119 Type 2 diabetes mellitus without complications: Secondary | ICD-10-CM | POA: Diagnosis present

## 2023-10-08 LAB — COMPREHENSIVE METABOLIC PANEL WITH GFR
ALT: 26 U/L (ref 0–44)
AST: 36 U/L (ref 15–41)
Albumin: 3.9 g/dL (ref 3.5–5.0)
Alkaline Phosphatase: 45 U/L (ref 38–126)
Anion gap: 14 (ref 5–15)
BUN: 15 mg/dL (ref 8–23)
CO2: 22 mmol/L (ref 22–32)
Calcium: 9.6 mg/dL (ref 8.9–10.3)
Chloride: 103 mmol/L (ref 98–111)
Creatinine, Ser: 1.11 mg/dL (ref 0.61–1.24)
GFR, Estimated: >60 mL/min (ref >60)
Glucose, Bld: 180 mg/dL — ABNORMAL HIGH (ref 70–99)
Potassium: 4.1 mmol/L (ref 3.5–5.1)
Sodium: 139 mmol/L (ref 135–145)
Total Bilirubin: 2 mg/dL — ABNORMAL HIGH (ref 0.0–1.2)
Total Protein: 7.1 g/dL (ref 6.5–8.1)

## 2023-10-08 LAB — DIFFERENTIAL
Abs Immature Granulocytes: 0.04 K/uL (ref 0.00–0.07)
Basophils Absolute: 0 K/uL (ref 0.0–0.1)
Basophils Relative: 0 %
Eosinophils Absolute: 0 K/uL (ref 0.0–0.5)
Eosinophils Relative: 0 %
Immature Granulocytes: 0 %
Lymphocytes Relative: 5 %
Lymphs Abs: 0.5 K/uL — ABNORMAL LOW (ref 0.7–4.0)
Monocytes Absolute: 0.5 K/uL (ref 0.1–1.0)
Monocytes Relative: 6 %
Neutro Abs: 8.4 K/uL — ABNORMAL HIGH (ref 1.7–7.7)
Neutrophils Relative %: 89 %

## 2023-10-08 LAB — CBC
HCT: 48.8 % (ref 39.0–52.0)
Hemoglobin: 16.3 g/dL (ref 13.0–17.0)
MCH: 31.5 pg (ref 26.0–34.0)
MCHC: 33.4 g/dL (ref 30.0–36.0)
MCV: 94.2 fL (ref 80.0–100.0)
Platelets: 150 K/uL (ref 150–400)
RBC: 5.18 MIL/uL (ref 4.22–5.81)
RDW: 13.1 % (ref 11.5–15.5)
WBC: 9.4 K/uL (ref 4.0–10.5)
nRBC: 0 % (ref 0.0–0.2)

## 2023-10-08 LAB — URINALYSIS, ROUTINE W REFLEX MICROSCOPIC
Bilirubin Urine: NEGATIVE
Glucose, UA: NEGATIVE mg/dL
Ketones, ur: NEGATIVE mg/dL
Nitrite: POSITIVE — AB
Protein, ur: 100 mg/dL — AB
Specific Gravity, Urine: 1.023 (ref 1.005–1.030)
pH: 5 (ref 5.0–8.0)

## 2023-10-08 LAB — RAPID URINE DRUG SCREEN, HOSP PERFORMED
Amphetamines: NOT DETECTED
Barbiturates: NOT DETECTED
Benzodiazepines: NOT DETECTED
Cocaine: NOT DETECTED
Opiates: NOT DETECTED
Tetrahydrocannabinol: NOT DETECTED

## 2023-10-08 LAB — GLUCOSE, CAPILLARY: Glucose-Capillary: 209 mg/dL — ABNORMAL HIGH (ref 70–99)

## 2023-10-08 LAB — ETHANOL: Alcohol, Ethyl (B): <15 mg/dL (ref <15)

## 2023-10-08 LAB — LACTIC ACID, PLASMA: Lactic Acid, Venous: 2.5 mmol/L (ref 0.5–1.9)

## 2023-10-08 MED ORDER — ACETAMINOPHEN 325 MG PO TABS
650.0000 mg | ORAL_TABLET | Freq: Four times a day (QID) | ORAL | Status: DC | PRN
  Administered 2023-10-08: 650 mg via ORAL
  Filled 2023-10-08: qty 2

## 2023-10-08 MED ORDER — BISACODYL 5 MG PO TBEC: 5.0000 mg | DELAYED_RELEASE_TABLET | Freq: Every day | ORAL | Status: DC | PRN

## 2023-10-08 MED ORDER — SODIUM CHLORIDE 0.9 % IV SOLN
1.0000 g | Freq: Once | INTRAVENOUS | Status: AC
  Administered 2023-10-08: 1 g via INTRAVENOUS
  Filled 2023-10-08: qty 10

## 2023-10-08 MED ORDER — INSULIN GLARGINE 100 UNIT/ML ~~LOC~~ SOLN
20.0000 [IU] | Freq: Every day | SUBCUTANEOUS | Status: DC
  Administered 2023-10-09 – 2023-10-10 (×2): 20 [IU] via SUBCUTANEOUS
  Filled 2023-10-08 (×4): qty 0.2

## 2023-10-08 MED ORDER — APIXABAN 5 MG PO TABS
5.0000 mg | ORAL_TABLET | Freq: Two times a day (BID) | ORAL | Status: DC
  Administered 2023-10-08 – 2023-10-10 (×4): 5 mg via ORAL
  Filled 2023-10-08 (×4): qty 1

## 2023-10-08 MED ORDER — ATORVASTATIN CALCIUM 40 MG PO TABS
40.0000 mg | ORAL_TABLET | Freq: Every day | ORAL | Status: DC
  Administered 2023-10-09 – 2023-10-10 (×2): 40 mg via ORAL
  Filled 2023-10-08 (×2): qty 1

## 2023-10-08 MED ORDER — INSULIN ASPART 100 UNIT/ML IJ SOLN
0.0000 [IU] | Freq: Three times a day (TID) | INTRAMUSCULAR | Status: DC
  Administered 2023-10-09: 1 [IU] via SUBCUTANEOUS
  Administered 2023-10-09: 2 [IU] via SUBCUTANEOUS
  Administered 2023-10-09: 1 [IU] via SUBCUTANEOUS

## 2023-10-08 MED ORDER — LACTATED RINGERS IV BOLUS
1000.0000 mL | Freq: Once | INTRAVENOUS | Status: AC
  Administered 2023-10-08: 1000 mL via INTRAVENOUS

## 2023-10-08 MED ORDER — DIAZEPAM 5 MG/ML IJ SOLN
5.0000 mg | Freq: Once | INTRAMUSCULAR | Status: AC
  Administered 2023-10-08: 5 mg via INTRAVENOUS
  Filled 2023-10-08: qty 2

## 2023-10-08 MED ORDER — ASPIRIN 81 MG PO TBEC
81.0000 mg | DELAYED_RELEASE_TABLET | Freq: Every day | ORAL | Status: DC
  Administered 2023-10-09 – 2023-10-10 (×2): 81 mg via ORAL
  Filled 2023-10-08 (×2): qty 1

## 2023-10-08 MED ORDER — ONDANSETRON HCL 4 MG PO TABS: 4.0000 mg | ORAL_TABLET | Freq: Four times a day (QID) | ORAL | Status: DC | PRN

## 2023-10-08 MED ORDER — SODIUM CHLORIDE 0.9 % IV SOLN: 1.0000 g | INTRAVENOUS | Status: DC

## 2023-10-08 MED ORDER — ACETAMINOPHEN 650 MG RE SUPP: 650.0000 mg | Freq: Four times a day (QID) | RECTAL | Status: DC | PRN

## 2023-10-08 MED ORDER — INSULIN GLARGINE-YFGN 100 UNIT/ML ~~LOC~~ SOLN: 20.0000 [IU] | Freq: Every day | SUBCUTANEOUS | Status: DC

## 2023-10-08 MED ORDER — PANTOPRAZOLE SODIUM 40 MG PO TBEC
40.0000 mg | DELAYED_RELEASE_TABLET | Freq: Every day | ORAL | Status: DC
  Administered 2023-10-09 – 2023-10-10 (×2): 40 mg via ORAL
  Filled 2023-10-08 (×2): qty 1

## 2023-10-08 MED ORDER — ONDANSETRON HCL 4 MG/2ML IJ SOLN: 4.0000 mg | Freq: Four times a day (QID) | INTRAMUSCULAR | Status: DC | PRN

## 2023-10-08 MED ORDER — LISINOPRIL 20 MG PO TABS
40.0000 mg | ORAL_TABLET | Freq: Every day | ORAL | Status: DC
  Administered 2023-10-09 – 2023-10-10 (×2): 40 mg via ORAL
  Filled 2023-10-08 (×2): qty 2

## 2023-10-08 NOTE — ED Provider Notes (Signed)
 [place] EMERGENCY DEPARTMENT AT [facility] Provider Note   CSN: [id number] Arrival date & time: 10/08/23  1724     Patient presents with: Altered Mental Status   [name] is a 63 y.o. male.    Altered Mental Status    Patient has a history of hypertension and polycythemia Burkitt's lymphoma DVT stroke aneurysm.  Patient states he was outside when he felt weak.  He could not get up.  Patient denies any injury.  Patient arrived by EMS and does not know who called EMS.  Per the EMS report he was found on the floor of his porch at about 1500 today.  They do not know for sure the last time he was normal.  He apparently did take his trash out yesterday.  Patient states he does have some deficits since his prior stroke.  Prior to Admission medications   Medication Sig Start Date End Date Taking? Authorizing Provider  apixaban  (ELIQUIS ) 5 MG TABS tablet Take 1 tablet (5 mg total) by mouth 2 (two) times daily. 04/09/23   Remi Pippin, NP  aspirin  EC 81 MG tablet Take 81 mg by mouth daily. Swallow whole.    [provider]  atorvastatin  (LIPITOR) 40 MG tablet Take 1 tablet (40 mg total) by mouth daily. 04/10/23   Remi Pippin, NP  Blood Glucose Monitoring Suppl (BLOOD GLUCOSE MONITOR SYSTEM) w/Device KIT 1 each by Does not apply route 3 (three) times daily. May dispense any manufacturer covered by patient's insurance. 04/09/23   Remi Pippin, NP  Continuous Glucose Sensor (DEXCOM G7 SENSOR) MISC Change every 10 days to monitor blood glucose continuously 04/02/23   [provider]  dorzolamide -timolol  (COSOPT ) 22.3-6.8 MG/ML ophthalmic solution Place 1 drop into the right eye 2 (two) times daily. 08/13/19   [provider]  Glucose Blood (BLOOD GLUCOSE TEST STRIPS) STRP Use 1 each 3 (three) times daily as directed to check blood sugar. May dispense any manufacturer covered by patient's insurance and fits patient's device. 04/09/23   Remi Pippin, NP  Insulin   Pen Needle 32G X 4 MM MISC Use 1 each 3 (three) times daily. May dispense any manufacturer covered by patient's insurance. 04/09/23   Remi Pippin, NP  JARDIANCE  10 MG TABS tablet Take 10 mg by mouth daily.    [provider]  LACTASE ENZYME PO Take by mouth. *natural lactase enzyme* 3000 fcc alu    [provider]  Lancet Device MISC 1 each by Does not apply route 3 (three) times daily. May dispense any manufacturer covered by patient's insurance. 04/09/23   Remi Pippin, NP  Lancets [facility] DELICA PLUS [place]) MISC 1 each 3 (three) times daily. Use as directed to check blood sugar. May dispense any manufacturer covered by patient's insurance and fits patient's device. 04/09/23   Remi Pippin, NP  linagliptin  (TRADJENTA ) 5 MG TABS tablet Take 1 tablet (5 mg total) by mouth daily. 09/06/19   Johnson, Clanford L, MD  lisinopril  (PRINIVIL ,ZESTRIL ) 40 MG tablet Take 40 mg by mouth daily.  08/07/14   [provider]  Multiple Vitamin (MULTIVITAMIN WITH MINERALS) TABS tablet Take 1 tablet by mouth daily.    [provider]  Netarsudil-Latanoprost  (ROCKLATAN) 0.02-0.005 % SOLN Apply to eye.    [provider]  omeprazole (PRILOSEC) 20 MG capsule Take 20 mg by mouth daily.    [provider]  Probiotic Product (PROBIOTIC PO) Take by mouth. *10 strain probiotic* contains 10 strains of beneficial bacteria with  10 mg b infantis    [provider]  sildenafil (VIAGRA) 100 MG tablet Take 100 mg by mouth daily as needed for erectile dysfunction.     [provider]  tamsulosin  (FLOMAX ) 0.4 MG CAPS capsule Take 0.4 mg by mouth daily.    [provider]  testosterone  cypionate (DEPOTESTOSTERONE CYPIONATE) 200 MG/ML injection Inject 200 mg into the muscle every 14 (fourteen) days.    [provider]  valACYclovir  (VALTREX ) 1000 MG tablet Take 1,000 mg by mouth daily.    [provider]    Allergies: Codeine ,  Hydrocodone-acetaminophen , and Vicodin [hydrocodone-acetaminophen ]    Review of Systems  Updated Vital Signs BP (!) 157/95 (BP Location: Right Arm)   Pulse (!) 112   Temp 100 F (37.8 C) (Oral)   SpO2 94%   Physical Exam Vitals and nursing note reviewed.  Constitutional:      General: He is not in acute distress.    Appearance: He is well-developed.  HENT:     Head: Normocephalic and atraumatic.     Right Ear: External ear normal.     Left Ear: External ear normal.  Eyes:     General: No visual field deficit or scleral icterus.       Right eye: No discharge.        Left eye: No discharge.     Conjunctiva/sclera: Conjunctivae normal.  Neck:     Trachea: No tracheal deviation.  Cardiovascular:     Rate and Rhythm: Normal rate and regular rhythm.  Pulmonary:     Effort: Pulmonary effort is normal. No respiratory distress.     Breath sounds: Normal breath sounds. No stridor. No wheezing or rales.  Abdominal:     General: Bowel sounds are normal. There is no distension.     Palpations: Abdomen is soft.     Tenderness: There is no abdominal tenderness. There is no guarding or rebound.  Musculoskeletal:        General: No tenderness.     Cervical back: Neck supple.  Skin:    General: Skin is warm and dry.     Findings: No rash.  Neurological:     Mental Status: He is alert and oriented to person, place, and time.     Cranial Nerves: Dysarthria present. No cranial nerve deficit or facial asymmetry.     Sensory: No sensory deficit.     Motor: No abnormal muscle tone, seizure activity or pronator drift.     Coordination: Coordination normal.     Comments:  able to hold both legs off bed for 5 seconds, sensation intact in all extremities,  no left or right sided neglect, , no nystagmus noted, difficulty with word finding also noted to have some dysarthria   Psychiatric:        Mood and Affect: Mood normal.     (all labs ordered are listed, but only abnormal results are  displayed) Labs Reviewed  DIFFERENTIAL - Abnormal; Notable for the following components:      Result Value   Neutro Abs 8.4 (*)    Lymphs Abs 0.5 (*)    All other components within normal limits  COMPREHENSIVE METABOLIC PANEL WITH GFR - Abnormal; Notable for the following components:   Glucose, Bld 180 (*)    Total Bilirubin 2.0 (*)    All other components within normal limits  URINALYSIS, ROUTINE W REFLEX MICROSCOPIC - Abnormal; Notable for the following components:   APPearance HAZY (*)  Hgb urine dipstick MODERATE (*)    Protein, ur 100 (*)    Nitrite POSITIVE (*)    Leukocytes,Ua SMALL (*)    Bacteria, UA MANY (*)    All other components within normal limits  CBC  RAPID URINE DRUG SCREEN, HOSP PERFORMED  ETHANOL  LACTIC ACID, PLASMA  LACTIC ACID, PLASMA    EKG: EKG Interpretation Date/Time:  Friday October 08 2023 17:31:46 EDT Ventricular Rate:  113 PR Interval:  151 QRS Duration:  84 QT Interval:  309 QTC Calculation: 424 R Axis:   17  Text Interpretation: Sinus tachycardia No significant change since last tracing Confirmed by Randol Simmonds [phone]) on 10/08/2023 5:34:38 PM  Radiology: [facility] Chest Portable 1 View Result Date: 10/08/2023 CLINICAL DATA:  Weakness, altered level of consciousness EXAM: PORTABLE CHEST 1 VIEW COMPARISON:  09/04/2019 FINDINGS: Single frontal view of the chest demonstrates an unremarkable cardiac silhouette. No acute airspace disease, effusion, or pneumothorax. No acute bony abnormalities. IMPRESSION: 1. No acute intrathoracic process. Electronically Signed   By: Ozell Daring [name]   On: 10/08/2023 19:42   CT Cervical Spine Wo Contrast Result Date: 10/08/2023 CLINICAL DATA:  Altered level of consciousness EXAM: CT CERVICAL SPINE WITHOUT CONTRAST TECHNIQUE: Multidetector CT imaging of the cervical spine was performed without intravenous contrast. Multiplanar CT image reconstructions were also generated. RADIATION DOSE REDUCTION: This exam was  performed according to the departmental dose-optimization program which includes automated exposure control, adjustment of the mA and/or kV according to patient size and/or use of iterative reconstruction technique. COMPARISON:  None Available. FINDINGS: Alignment: Alignment is grossly anatomic. Skull base and vertebrae: No acute fracture. No primary bone lesion or focal pathologic process. Soft tissues and spinal canal: No prevertebral fluid or swelling. No visible canal hematoma. Disc levels: Left predominant facet hypertrophic changes from C3-4 through C5-6. Minimal spondylosis at C4-5 and C5-6. Upper chest: Airway is patent. Visualized portions of the lung apices are clear. Other: Reconstructed images demonstrate no additional findings. IMPRESSION: 1. No acute cervical spine fracture. Electronically Signed   By: Ozell Daring [name]   On: 10/08/2023 18:28   CT HEAD WO CONTRAST Result Date: 10/08/2023 CLINICAL DATA:  Altered level of consciousness EXAM: CT HEAD WITHOUT CONTRAST TECHNIQUE: Contiguous axial images were obtained from the base of the skull through the vertex without intravenous contrast. RADIATION DOSE REDUCTION: This exam was performed according to the departmental dose-optimization program which includes automated exposure control, adjustment of the mA and/or kV according to patient size and/or use of iterative reconstruction technique. COMPARISON:  04/07/2023 FINDINGS: Brain: No evidence of acute infarct or hemorrhage. Chronic small vessel ischemic changes are seen throughout the periventricular white matter. Focal encephalomalacia has developed within the left corona radiata at the site of acute infarct noted on prior exam. Lateral ventricles and remaining midline structures are unremarkable. No acute extra-axial fluid collections. No mass effect. Vascular: No hyperdense vessel or unexpected calcification. Skull: Normal. Negative for fracture or focal lesion. Sinuses/Orbits: No acute finding.  Other: None. IMPRESSION: 1. Chronic ischemic changes throughout the periventricular white matter as above. 2. No acute intracranial process. Electronically Signed   By: Ozell Daring [name]   On: 10/08/2023 18:25     Procedures   Medications Ordered in the ED  cefTRIAXone  (ROCEPHIN ) 1 g in sodium chloride  0.9 % 100 mL IVPB (has no administration in time range)  diazepam  (VALIUM ) injection 5 mg (has no administration in time range)  lactated ringers  bolus 1,000 mL (1,000 mLs Intravenous New Bag/Given  10/08/23 1829)    Clinical Course as of 10/08/23 1955  Fri Oct 08, 2023  1921 Urinalysis, Routine w reflex microscopic -Urine, Clean Catch(!) Urinalysis shows many bacteria positive nitrate leukocyte esterase suggestive of UTI.  CBC normal.  Metabolic panel normal. [JK]  1921 Head CT and C-spine CT without acute abnormality. [JK]  1928 Reviewed findings with patient.  Recommended admitted to the hospital.  Patient is refusing.  States he will not stay in the hospital. [JK]  1941 Patient continues to try to get out of bed.  He is not able to stand on his own.  I do not feel that it is safe for him to go home.  I will give him a dose of Valium  will plan on admission to the hospital [JK]  1954 Case discussed with Dr. Arthea regarding admission [JK]    Clinical Course User Index [JK] Randol Simmonds, MD                                 Medical Decision Making Problems Addressed: Acute cystitis without hematuria: acute illness or injury that poses a threat to life or bodily functions Weakness: acute illness or injury that poses a threat to life or bodily functions  Amount and/or Complexity of Data Reviewed Labs: ordered. Decision-making details documented in ED Course. Radiology: ordered.  Risk Prescription drug management.   Patient presented to the ER for evaluation after being found on his porch today.  Patient reported that he did not pass out.  However he initially was confused and  neighbors were not able to understand him.  Patient was not able to sit up or stand on his own.  In the ER patient does appear more alert he is speaking to me although does seem to have some deficits from a prior stroke presumably.  Head CT and C-spine CT without acute abnormality.  Chest x-ray does not show pneumonia.  Patient noted to have low-grade temp.  No significant leukocytosis.  No significant metabolic abnormalities noted.  Urinalysis does suggest UTI.  Suspect the UTI is contributing to his weakness.  IV fluids have been ordered have ordered antibiotics.  Patient does not want to stay in the hospital but he is not strong enough to stand on his own.  Will try to admit him to the hospital for further treatment.     Final diagnoses:  Acute cystitis without hematuria  Weakness    ED Discharge Orders     None          Randol Simmonds, MD 10/08/23 1949

## 2023-10-08 NOTE — ED Notes (Signed)
 Pt is attempting to get out of bed. Stated that he has to go home to take care of his dogs. Pt stated that he has been here for a very long time and that he will have his friends pick him up. Pt notified that MD would be made aware of him wanting to leave against medical advice

## 2023-10-08 NOTE — ED Triage Notes (Signed)
 Patient BIB EMS from home after he flagged down some neighbors where he was found on the floor of the porch at around 1500 today. Patient was diaphoretic and not making any sense when the neighbors came over. Patient was AMS originally, but started becoming AxO with EMS. Patient was unable to stand up/sit up on his own. Patient has left sided facial droop and was unable to lift the left leg with EMS. Patient also has unequal pupils with the right being larger than the left. Patient also has a gaze preference to the right and slurred speech. Patient originally was unable to recognize the left side, but is now able to. Patient LKW, according to neighbors was yesterday as they saw that he had taken his garbage back inside. Unknown if stroke like symptoms are resolved or not at this time.

## 2023-10-08 NOTE — H&P (Signed)
 History and Physical    Patient: [name] FMW:[id number] DOB: 02-26-1960 DOA: 10/08/2023 DOS: the patient was seen and examined on 10/08/2023 PCP: Lazoff, Shawn P, DO  Patient coming from: Home  Chief Complaint:  Chief Complaint  Patient presents with   Altered Mental Status   HPI: [name] is a 63 y.o. male with medical history significant for stroke 6 mo ago with residual dysarthria.  He was seen by neurology and had full workup at that time. He also has history of lymphoma, type 2 diabetes mellitus, previous cerebellar stroke with baseline disequilibrium, hypertension, and hyperlipidemia.  He is on Eliquis . Patient was in his usual state of health this morning around 10:00 he went outside on the porch and felt acutely wobbly.  He says he sat down on the porch because he was afraid that he was going to fall.  Once he sat down he felt a little better but he was unable to get up.  He stayed seated on his porch for about 30 minutes before a neighbor walked by that he could call.  The neighbor called EMS and the patient was brought in for evaluation.  He denies any fevers or chills or shortness of breath or chest pain.  He says he really felt fine this morning when he woke up. This has happened to him in the past but when he has felt wobbly before he was able to get up.  This is the first time he was not able to get himself up. Report from EMS revealed that the patient had a right great gaze preference and was unable to recognize his left side. He was unable to lift his left leg per their exam.  The patient also was reported to have a left-sided facial droop and garbled speech.  The patient does have chronic dysarthria from his previous stroke 6 months ago. All of these findings other than the dysarthria which is chronic completely resolved by the time the patient was evaluated in the emergency department.  His ED work up included a CT scan of his head which was unremarkable for new event.   His white blood cell count was normal his labs looked okay.  His UA however was consistent with infection.  Initial lactic acid was 2.5. The patient was given some IV fluids and IV Rocephin  and he will be admitted overnight.   Review of Systems: As mentioned in the history of present illness. All other systems reviewed and are negative. Past Medical History:  Diagnosis Date   Abnormal liver function test 08/31/2011   Mild elevation bilirubin, normal/high hemoglobin, normal LDH   Adult ADHD    Aneurysm (HCC) 10/23/2014   4mm left anterior communicating artery region, noted on MRI head   Anxiety    Burkitt's lymphoma (HCC)    followed by Dr. Arvella Hof   Cerebellar infarct [facility]) 10/23/2014   Chronic, noted on MRI head   Chest pain    Depression    DVT (deep venous thrombosis) (HCC) 12/2015   Left leg   Elevated PSA    Glaucoma    Headache    History of double vision 2016   HTN (hypertension), benign 08/31/2011   Hyperlipidemia    Hypotestosteronemia    Polycythemia, secondary 08/31/2011   Likely due to testosterone  injections   Stroke (HCC)    White matter disease 10/23/2014   noted on MRI head, extensive   Past Surgical History:  Procedure Laterality Date   CEREBRAL ANGIOGRAM  11/23/2014   HERNIA REPAIR     KNEE SURGERY Left    SHOULDER SURGERY     Social History:  reports that he has never smoked. He has never used smokeless tobacco. He reports current alcohol  use. He reports that he does not use drugs.  Allergies  Allergen Reactions   Codeine  Nausea Only   Hydrocodone-Acetaminophen  Other (See Comments)    Makes patient sick   Vicodin [Hydrocodone-Acetaminophen ] Nausea And Vomiting    Family History  Problem Relation Age of Onset   Stroke Father        Deceased   Hypertension Mother        Living    Prior to Admission medications   Medication Sig Start Date End Date Taking? Authorizing Provider  apixaban  (ELIQUIS ) 5 MG TABS tablet Take 1 tablet (5 mg  total) by mouth 2 (two) times daily. 04/09/23   Remi Pippin, NP  aspirin  EC 81 MG tablet Take 81 mg by mouth daily. Swallow whole.    [provider]  atorvastatin  (LIPITOR) 40 MG tablet Take 1 tablet (40 mg total) by mouth daily. 04/10/23   Remi Pippin, NP  Blood Glucose Monitoring Suppl (BLOOD GLUCOSE MONITOR SYSTEM) w/Device KIT 1 each by Does not apply route 3 (three) times daily. May dispense any manufacturer covered by patient's insurance. 04/09/23   Remi Pippin, NP  Continuous Glucose Sensor (DEXCOM G7 SENSOR) MISC Change every 10 days to monitor blood glucose continuously 04/02/23   [provider]  dorzolamide -timolol  (COSOPT ) 22.3-6.8 MG/ML ophthalmic solution Place 1 drop into the right eye 2 (two) times daily. 08/13/19   [provider]  Glucose Blood (BLOOD GLUCOSE TEST STRIPS) STRP Use 1 each 3 (three) times daily as directed to check blood sugar. May dispense any manufacturer covered by patient's insurance and fits patient's device. 04/09/23   Remi Pippin, NP  insulin  lispro (HUMALOG ) 100 UNIT/ML KwikPen Inject 2-10 Units into the skin 3 (three) times daily. Per sliding scale:   CHECK BLOOD SUGARS BEFORE EACH MEAL AND FOR BLOOD SUGARS 150-200 INJECT 2 UNITS, 200-250 INJECT 4 UNITS, 250-300 INJECT 6 UNITS, 300-350 INJECT 8 UNITS, >350 INJECT 10 UNITS CHECK BLOOD SUGARS BEFORE EACH MEAL AND FOR BLOOD SUGARS 150-200 INJECT 2 UNITS, 200-250 INJECT 4 UNITS, 250-300 INJECT 6 UNITS, 300-350 INJECT 8 UNITS, >350 INJECT 10 UNITS 08/26/23   [provider]  Insulin  Pen Needle 32G X 4 MM MISC Use 1 each 3 (three) times daily. May dispense any manufacturer covered by patient's insurance. 04/09/23   Remi Pippin, NP  JARDIANCE  10 MG TABS tablet Take 10 mg by mouth daily.    [provider]  LACTASE ENZYME PO Take by mouth. *natural lactase enzyme* 3000 fcc alu    [provider]  Lancet Device MISC 1 each by Does not apply route 3 (three) times daily.  May dispense any manufacturer covered by patient's insurance. 04/09/23   Remi Pippin, NP  Lancets [facility] DELICA PLUS [place]) MISC 1 each 3 (three) times daily. Use as directed to check blood sugar. May dispense any manufacturer covered by patient's insurance and fits patient's device. 04/09/23   Remi Pippin, NP  LANTUS  SOLOSTAR 100 UNIT/ML Solostar Pen Inject 24 Units into the skin daily.    [provider]  linagliptin  (TRADJENTA ) 5 MG TABS tablet Take 1 tablet (5 mg total) by mouth daily. 09/06/19   Johnson, Clanford L, MD  lisinopril  (PRINIVIL ,ZESTRIL ) 40 MG tablet Take 40 mg by mouth daily.  08/07/14  [provider]  Multiple Vitamin (MULTIVITAMIN WITH MINERALS) TABS tablet Take 1 tablet by mouth daily.    [provider]  Netarsudil-Latanoprost  (ROCKLATAN) 0.02-0.005 % SOLN Apply to eye.    [provider]  omeprazole (PRILOSEC) 20 MG capsule Take 20 mg by mouth daily.    [provider]  Probiotic Product (PROBIOTIC PO) Take by mouth. *10 strain probiotic* contains 10 strains of beneficial bacteria with 10 mg b infantis    [provider]  sertraline (ZOLOFT) 50 MG tablet Take 50 mg by mouth daily. 09/30/23   [provider]  sildenafil (VIAGRA) 100 MG tablet Take 100 mg by mouth daily as needed for erectile dysfunction.     [provider]  tamsulosin  (FLOMAX ) 0.4 MG CAPS capsule Take 0.4 mg by mouth daily.    [provider]  testosterone  cypionate (DEPOTESTOSTERONE CYPIONATE) 200 MG/ML injection Inject 200 mg into the muscle every 14 (fourteen) days.    [provider]  valACYclovir  (VALTREX ) 1000 MG tablet Take 1,000 mg by mouth daily.    [provider]    Physical Exam: Vitals:   10/08/23 1727 10/08/23 1730  BP:  (!) 157/95  Pulse:  (!) 112  Temp:  100 F (37.8 C)  TempSrc:  Oral  SpO2: 97% 94%   Physical Exam:  General: No acute distress, well developed, well  nourished HEENT: Normocephalic, atraumatic, PERRL Cardiovascular: Normal rate and rhythm. Distal pulses intact. Pulmonary: Normal pulmonary effort, normal breath sounds Gastrointestinal: Nondistended abdomen, soft, non-tender, normoactive bowel sounds, no organomegaly Musculoskeletal:Normal ROM, no lower ext edema Lymphadenopathy: No cervical LAD. Skin: Skin is warm and dry. Neuro: No focal deficits noted, AAOx3.dysarthria PSYCH: Attentive and cooperative  Data Reviewed:  Results for orders placed or performed during the hospital encounter of 10/08/23 (from the past 24 hours)  CBC     Status: None   Collection Time: 10/08/23  5:43 PM  Result Value Ref Range   WBC 9.4 4.0 - 10.5 K/uL   RBC 5.18 4.22 - 5.81 MIL/uL   Hemoglobin 16.3 13.0 - 17.0 g/dL   HCT 51.1 60.9 - 47.9 %   MCV 94.2 80.0 - 100.0 fL   MCH 31.5 26.0 - 34.0 pg   MCHC 33.4 30.0 - 36.0 g/dL   RDW 86.8 88.4 - 84.4 %   Platelets 150 150 - 400 K/uL   nRBC 0.0 0.0 - 0.2 %  Differential     Status: Abnormal   Collection Time: 10/08/23  5:43 PM  Result Value Ref Range   Neutrophils Relative % 89 %   Neutro Abs 8.4 (H) 1.7 - 7.7 K/uL   Lymphocytes Relative 5 %   Lymphs Abs 0.5 (L) 0.7 - 4.0 K/uL   Monocytes Relative 6 %   Monocytes Absolute 0.5 0.1 - 1.0 K/uL   Eosinophils Relative 0 %   Eosinophils Absolute 0.0 0.0 - 0.5 K/uL   Basophils Relative 0 %   Basophils Absolute 0.0 0.0 - 0.1 K/uL   Immature Granulocytes 0 %   Abs Immature Granulocytes 0.04 0.00 - 0.07 K/uL  Comprehensive metabolic panel     Status: Abnormal   Collection Time: 10/08/23  5:43 PM  Result Value Ref Range   Sodium 139 135 - 145 mmol/L   Potassium 4.1 3.5 - 5.1 mmol/L   Chloride 103 98 - 111 mmol/L   CO2 22 22 - 32 mmol/L   Glucose, Bld 180 (H) 70 - 99 mg/dL   BUN 15 8 -  23 mg/dL   Creatinine, Ser 8.88 0.61 - 1.24 mg/dL   Calcium  9.6 8.9 - 10.3 mg/dL   Total Protein 7.1 6.5 - 8.1 g/dL   Albumin 3.9 3.5 - 5.0 g/dL   AST 36 15 - 41 U/L    ALT 26 0 - 44 U/L   Alkaline Phosphatase 45 38 - 126 U/L   Total Bilirubin 2.0 (H) 0.0 - 1.2 mg/dL   GFR, Estimated >39 >39 mL/min   Anion gap 14 5 - 15  Urinalysis, Routine w reflex microscopic -Urine, Clean Catch     Status: Abnormal   Collection Time: 10/08/23  5:44 PM  Result Value Ref Range   Color, Urine YELLOW YELLOW   APPearance HAZY (A) CLEAR   Specific Gravity, Urine 1.023 1.005 - 1.030   pH 5.0 5.0 - 8.0   Glucose, UA NEGATIVE NEGATIVE mg/dL   Hgb urine dipstick MODERATE (A) NEGATIVE   Bilirubin Urine NEGATIVE NEGATIVE   Ketones, ur NEGATIVE NEGATIVE mg/dL   Protein, ur 899 (A) NEGATIVE mg/dL   Nitrite POSITIVE (A) NEGATIVE   Leukocytes,Ua SMALL (A) NEGATIVE   RBC / HPF 6-10 0 - 5 RBC/hpf   WBC, UA 11-20 0 - 5 WBC/hpf   Bacteria, UA MANY (A) NONE SEEN   Squamous Epithelial / HPF 0-5 0 - 5 /HPF   Mucus PRESENT   Urine rapid drug screen (hosp performed)     Status: None   Collection Time: 10/08/23  5:44 PM  Result Value Ref Range   Opiates NONE DETECTED NONE DETECTED   Cocaine NONE DETECTED NONE DETECTED   Benzodiazepines NONE DETECTED NONE DETECTED   Amphetamines NONE DETECTED NONE DETECTED   Tetrahydrocannabinol NONE DETECTED NONE DETECTED   Barbiturates NONE DETECTED NONE DETECTED  Lactic acid, plasma     Status: Abnormal   Collection Time: 10/08/23  7:50 PM  Result Value Ref Range   Lactic Acid, Venous 2.5 (HH) 0.5 - 1.9 mmol/L     Assessment and Plan: UTI/ acute neuro findings now resolved - - IV Rocephin  - Brain MRI - PT/OT, walking test - Continue aspirin  and Eliquis  - Recently had a full neuro worku with a stroke 6 months ago  2. DMT2 -he has a Dexcom on.  His blood sugars have been ok the last few days. - Resume Insulin  at a lower dose while hospitalized - Hold Jardiance  - Add corrective dose insulin  as needed  3.  Hyperlipidemia -continue Lipitor  4.  Hypertension continue ACE inhibitor    Advance Care Planning:   Code Status: Full Code the  patient names his mother as a surrogate decision maker and he wants to be full code.  Consults: none  Family Communication: Mom at bedside  Severity of Illness: The appropriate patient status for this patient is Observation. The patient's presenting symptoms, physical exam findings, and initial radiographic and laboratory data in the context of their chronic comorbidities is felt to place them at high risk for further clinical deterioration. Furthermore, it is not anticipated that the patient will be medically stable for discharge from the hospital within 2 midnights of admission.     Author: ARTHEA CHILD, MD 10/08/2023 9:28 PM  For on call review [email or web address].

## 2023-10-08 NOTE — ED Notes (Signed)
 Attempted to help pt use his urinal. Pt unable to sit without losing his balance. MD made aware

## 2023-10-09 ENCOUNTER — Observation Stay (HOSPITAL_COMMUNITY)

## 2023-10-09 DIAGNOSIS — E785 Hyperlipidemia, unspecified: Secondary | ICD-10-CM | POA: Diagnosis present

## 2023-10-09 DIAGNOSIS — K219 Gastro-esophageal reflux disease without esophagitis: Secondary | ICD-10-CM | POA: Diagnosis present

## 2023-10-09 DIAGNOSIS — E119 Type 2 diabetes mellitus without complications: Secondary | ICD-10-CM | POA: Diagnosis present

## 2023-10-09 DIAGNOSIS — Z86718 Personal history of other venous thrombosis and embolism: Secondary | ICD-10-CM | POA: Diagnosis not present

## 2023-10-09 DIAGNOSIS — Z1152 Encounter for screening for COVID-19: Secondary | ICD-10-CM | POA: Diagnosis not present

## 2023-10-09 DIAGNOSIS — Z823 Family history of stroke: Secondary | ICD-10-CM | POA: Diagnosis not present

## 2023-10-09 DIAGNOSIS — F32A Depression, unspecified: Secondary | ICD-10-CM | POA: Diagnosis present

## 2023-10-09 DIAGNOSIS — Z794 Long term (current) use of insulin: Secondary | ICD-10-CM | POA: Diagnosis not present

## 2023-10-09 DIAGNOSIS — N3000 Acute cystitis without hematuria: Secondary | ICD-10-CM | POA: Diagnosis present

## 2023-10-09 DIAGNOSIS — H409 Unspecified glaucoma: Secondary | ICD-10-CM | POA: Diagnosis present

## 2023-10-09 DIAGNOSIS — I10 Essential (primary) hypertension: Secondary | ICD-10-CM | POA: Diagnosis present

## 2023-10-09 DIAGNOSIS — Z7982 Long term (current) use of aspirin: Secondary | ICD-10-CM | POA: Diagnosis not present

## 2023-10-09 DIAGNOSIS — Z8249 Family history of ischemic heart disease and other diseases of the circulatory system: Secondary | ICD-10-CM | POA: Diagnosis not present

## 2023-10-09 DIAGNOSIS — Z7984 Long term (current) use of oral hypoglycemic drugs: Secondary | ICD-10-CM | POA: Diagnosis not present

## 2023-10-09 DIAGNOSIS — I69322 Dysarthria following cerebral infarction: Secondary | ICD-10-CM | POA: Diagnosis not present

## 2023-10-09 DIAGNOSIS — Z885 Allergy status to narcotic agent status: Secondary | ICD-10-CM | POA: Diagnosis not present

## 2023-10-09 DIAGNOSIS — A419 Sepsis, unspecified organism: Secondary | ICD-10-CM | POA: Diagnosis present

## 2023-10-09 DIAGNOSIS — N390 Urinary tract infection, site not specified: Secondary | ICD-10-CM | POA: Diagnosis present

## 2023-10-09 DIAGNOSIS — C8370 Burkitt lymphoma, unspecified site: Secondary | ICD-10-CM | POA: Diagnosis present

## 2023-10-09 DIAGNOSIS — Z79899 Other long term (current) drug therapy: Secondary | ICD-10-CM | POA: Diagnosis not present

## 2023-10-09 DIAGNOSIS — Z7901 Long term (current) use of anticoagulants: Secondary | ICD-10-CM | POA: Diagnosis not present

## 2023-10-09 LAB — CBC
HCT: 47.4 % (ref 39.0–52.0)
Hemoglobin: 16.2 g/dL (ref 13.0–17.0)
MCH: 32.1 pg (ref 26.0–34.0)
MCHC: 34.2 g/dL (ref 30.0–36.0)
MCV: 94 fL (ref 80.0–100.0)
Platelets: 122 K/uL — ABNORMAL LOW (ref 150–400)
RBC: 5.04 MIL/uL (ref 4.22–5.81)
RDW: 13.3 % (ref 11.5–15.5)
WBC: 6.9 K/uL (ref 4.0–10.5)
nRBC: 0 % (ref 0.0–0.2)

## 2023-10-09 LAB — MAGNESIUM: Magnesium: 1.6 mg/dL — ABNORMAL LOW (ref 1.7–2.4)

## 2023-10-09 LAB — BASIC METABOLIC PANEL WITH GFR
Anion gap: 10 (ref 5–15)
BUN: 14 mg/dL (ref 8–23)
CO2: 24 mmol/L (ref 22–32)
Calcium: 9.1 mg/dL (ref 8.9–10.3)
Chloride: 104 mmol/L (ref 98–111)
Creatinine, Ser: 1.08 mg/dL (ref 0.61–1.24)
GFR, Estimated: >60 mL/min (ref >60)
Glucose, Bld: 159 mg/dL — ABNORMAL HIGH (ref 70–99)
Potassium: 3.8 mmol/L (ref 3.5–5.1)
Sodium: 138 mmol/L (ref 135–145)

## 2023-10-09 LAB — RESP PANEL BY RT-PCR (RSV, FLU A&B, COVID)  RVPGX2
Influenza A by PCR: NEGATIVE
Influenza B by PCR: NEGATIVE
Resp Syncytial Virus by PCR: NEGATIVE
SARS Coronavirus 2 by RT PCR: NEGATIVE

## 2023-10-09 LAB — GLUCOSE, CAPILLARY
Glucose-Capillary: 166 mg/dL — ABNORMAL HIGH (ref 70–99)
Glucose-Capillary: 225 mg/dL — ABNORMAL HIGH (ref 70–99)
Glucose-Capillary: 164 mg/dL — ABNORMAL HIGH (ref 70–99)
Glucose-Capillary: 226 mg/dL — ABNORMAL HIGH (ref 70–99)

## 2023-10-09 LAB — LACTIC ACID, PLASMA: Lactic Acid, Venous: 1.7 mmol/L (ref 0.5–1.9)

## 2023-10-09 MED ORDER — KETOROLAC TROMETHAMINE 15 MG/ML IJ SOLN
15.0000 mg | Freq: Once | INTRAMUSCULAR | Status: AC
  Administered 2023-10-09: 15 mg via INTRAVENOUS
  Filled 2023-10-09: qty 1

## 2023-10-09 MED ORDER — ACETAMINOPHEN 650 MG RE SUPP: 650.0000 mg | Freq: Four times a day (QID) | RECTAL | Status: DC | PRN

## 2023-10-09 MED ORDER — SODIUM CHLORIDE 0.9 % IV SOLN
2.0000 g | INTRAVENOUS | Status: DC
  Administered 2023-10-09 – 2023-10-10 (×2): 2 g via INTRAVENOUS
  Filled 2023-10-09 (×2): qty 20

## 2023-10-09 MED ORDER — SULFAMETHOXAZOLE-TRIMETHOPRIM 800-160 MG PO TABS: 1.0000 | ORAL_TABLET | Freq: Two times a day (BID) | ORAL | 0 refills | Status: AC

## 2023-10-09 MED ORDER — MAGNESIUM 400 MG PO CAPS: 400.0000 mg | ORAL_CAPSULE | Freq: Every day | ORAL | 0 refills | Status: AC

## 2023-10-09 MED ORDER — ACETAMINOPHEN 500 MG PO TABS
1000.0000 mg | ORAL_TABLET | Freq: Four times a day (QID) | ORAL | Status: DC | PRN
  Administered 2023-10-09: 1000 mg via ORAL
  Filled 2023-10-09: qty 2

## 2023-10-09 NOTE — Progress Notes (Signed)
 PROGRESS NOTE    FIORE DETJEN  FMW:[id number] DOB: 22-Feb-1960 DOA: 10/08/2023 PCP: Lazoff, Shawn P, DO  Chief Complaint  Patient presents with   Altered Mental Status    Brief Narrative:   [name] is [name] 63 y.o. male with medical history significant for stroke 6 mo ago with residual dysarthria.  He was seen by neurology and had full workup at that time. He also has history of lymphoma, type 2 diabetes mellitus, previous cerebellar stroke with baseline disequilibrium, hypertension, and hyperlipidemia.  He is on Eliquis . Patient was in his usual state of health this morning around 10:00 he went outside on the porch and felt acutely wobbly.  He says he sat down on the porch because he was afraid that he was going to fall.  Once he sat down he felt Dequavion Follette little better but he was unable to get up.  He stayed seated on his porch for about 30 minutes before [place] neighbor walked by that he could call.  The neighbor called EMS and the patient was brought in for evaluation.  He denies any fevers or chills or shortness of breath or chest pain.  He says he really felt fine this morning when he woke up. This has happened to him in the past but when he has felt wobbly before he was able to get up.  This is the first time he was not able to get himself up. Report from EMS revealed that the patient had Natlie Asfour right great gaze preference and was unable to recognize his left side. He was unable to lift his left leg per their exam.  The patient also was reported to have Shaunda Tipping left-sided facial droop and garbled speech.  The patient does have chronic dysarthria from his previous stroke 6 months ago. All of these findings other than the dysarthria which is chronic completely resolved by the time the patient was evaluated in the emergency department.  His ED work up included Marguetta Windish CT scan of his head which was unremarkable for new event.  His white blood cell count was normal his labs looked okay.  His UA however was consistent with  infection.  Initial lactic acid was 2.5. The patient was given some IV fluids and IV Rocephin  and he will be admitted overnight.  Assessment & Plan:   Principal Problem:   UTI (urinary tract infection) Active Problems:   Weakness   Diabetes mellitus (HCC)   Acute ischemic stroke (HCC)   Dysarthria   Burkitt's lymphoma (HCC)  Sepsis due to Latash Nouri UTI He's feeling better now after abx  UA consistent with UTI  Urine culture pending - discussed need to monitor him until cultures result, he doesn't want to wait until we have sensitivities.  With improvement, reasonable to d/c with first line abx and follow cultures.  Blood cultures were not collected   History of Stroke  Focal Neurologic Symptosm ? Recrudescence of stroke symptoms in setting of his infection abvoe Continue eliquis , aspirin , statin Awaiting repeat imaging (MRI)  T2DM Continue SSI, reduced dose basal Doesn't appear he's on jardiance , will double check - this should be discontinued  Hx DVT Eliquis    HTN Continue  lisinopril   GERD PPI     DVT prophylaxis: eliquis  Code Status: full Family Communication: mother Disposition:   Status is: Inpatient Remains inpatient appropriate because: need for continued inpatient care   Consultants:  none  Procedures:  none  Antimicrobials:  Anti-infectives (From admission, onward)    Start  Dose/Rate Route Frequency Ordered Stop   10/09/23 1000  cefTRIAXone  (ROCEPHIN ) 1 g in sodium chloride  0.9 % 100 mL IVPB  Status:  Discontinued        1 g 200 mL/hr over 30 Minutes Intravenous Every 24 hours 10/08/23 2344 10/09/23 0754   10/09/23 1000  cefTRIAXone  (ROCEPHIN ) 2 g in sodium chloride  0.9 % 100 mL IVPB        2 g 200 mL/hr over 30 Minutes Intravenous Every 24 hours 10/09/23 0754     10/09/23 0000  sulfamethoxazole -trimethoprim  (BACTRIM  DS) 800-160 MG tablet        1 tablet Oral 2 times daily 10/09/23 1654 10/16/23 2359   10/08/23 1930  cefTRIAXone  (ROCEPHIN ) 1 g  in sodium chloride  0.9 % 100 mL IVPB        1 g 200 mL/hr over 30 Minutes Intravenous  Once 10/08/23 1925 10/08/23 2029       Subjective: Feels back to himself, eager to go home if possible   Objective: Vitals:   10/09/23 0744 10/09/23 0942 10/09/23 1123 10/09/23 1550  BP: 127/86 (!) 130/95 117/66 (!) 147/83  Pulse: 77 78 87 81  Resp: 18 20 18 20   Temp: 99.3 F (37.4 C) 98.3 F (36.8 C) 98.1 F (36.7 C) 98.2 F (36.8 C)  TempSrc: Oral Oral Oral Oral  SpO2: 97% 98% 96% 97%    Intake/Output Summary (Last 24 hours) at 10/09/2023 1708 Last data filed at 10/09/2023 1700 Gross per 24 hour  Intake 817 ml  Output 450 ml  Net 367 ml   There were no vitals filed for this visit.  Examination:  General exam: Appears calm and comfortable  Respiratory system: unlabored Cardiovascular system: RRR Gastrointestinal system: Abdomen is nondistended, no CVA ttp Central nervous system: Alert and oriented. Dysarthria.  Symmetric strength, normal FNF and H2S. Extremities: no LEE    Data Reviewed: I have personally reviewed following labs and imaging studies  CBC: Recent Labs  Lab 10/08/23 1743 10/09/23 0810  WBC 9.4 6.9  NEUTROABS 8.4*  --   HGB 16.3 16.2  HCT 48.8 47.4  MCV 94.2 94.0  PLT 150 122*    Basic Metabolic Panel: Recent Labs  Lab 10/08/23 1743 10/09/23 0810  NA 139 138  K 4.1 3.8  CL 103 104  CO2 22 24  GLUCOSE 180* 159*  BUN 15 14  CREATININE 1.11 1.08  CALCIUM  9.6 9.1  MG  --  1.6*    GFR: CrCl cannot be calculated (Unknown ideal weight.).  Liver Function Tests: Recent Labs  Lab 10/08/23 1743  AST 36  ALT 26  ALKPHOS 45  BILITOT 2.0*  PROT 7.1  ALBUMIN 3.9    CBG: Recent Labs  Lab 10/08/23 2243 10/09/23 0759 10/09/23 1218 10/09/23 1615  GLUCAP 209* 166* 225* 164*     Recent Results (from the past 240 hours)  Resp panel by RT-PCR (RSV, Flu Keiyon Plack&B, Covid) Anterior Nasal Swab     Status: None   Collection Time: 10/08/23 11:35 PM    Specimen: Anterior Nasal Swab  Result Value Ref Range Status   SARS Coronavirus 2 by RT PCR NEGATIVE NEGATIVE Final   Influenza Heela Heishman by PCR NEGATIVE NEGATIVE Final   Influenza B by PCR NEGATIVE NEGATIVE Final    Comment: (NOTE) The Xpert Xpress SARS-CoV-2/FLU/RSV plus assay is intended as an aid in the diagnosis of influenza from Nasopharyngeal swab specimens and should not be used as Montrice Montuori sole basis for treatment. Nasal washings and aspirates  are unacceptable for Xpert Xpress SARS-CoV-2/FLU/RSV testing.  Fact Sheet for Patients: BloggerCourse.com  Fact Sheet for Healthcare Providers: SeriousBroker.it  This test is not yet approved or cleared by the [place]  FDA and has been authorized for detection and/or diagnosis of SARS-CoV-2 by FDA under an Emergency Use Authorization (EUA). This EUA will remain in effect (meaning this test can be used) for the duration of the COVID-19 declaration under Section 564(b)(1) of the Act, 21 U.S.C. section 360bbb-3(b)(1), unless the authorization is terminated or revoked.     Resp Syncytial Virus by PCR NEGATIVE NEGATIVE Final    Comment: (NOTE) Fact Sheet for Patients: BloggerCourse.com  Fact Sheet for Healthcare Providers: SeriousBroker.it  This test is not yet approved or cleared by the [place]  FDA and has been authorized for detection and/or diagnosis of SARS-CoV-2 by FDA under an Emergency Use Authorization (EUA). This EUA will remain in effect (meaning this test can be used) for the duration of the COVID-19 declaration under Section 564(b)(1) of the Act, 21 U.S.C. section 360bbb-3(b)(1), unless the authorization is terminated or revoked.  Performed at [facility], [address], [place], [place] [address]          Radiology Studies: MR BRAIN WO CONTRAST Result Date: 10/09/2023 CLINICAL DATA:  Neuro deficit, acute,  stroke suspected. EXAM: MRI HEAD WITHOUT CONTRAST TECHNIQUE: Multiplanar, multiecho pulse sequences of the brain and surrounding structures were obtained without intravenous contrast. COMPARISON:  Head CT 10/08/2023 and MRI 04/08/2023 FINDINGS: Brain: There is no evidence of an acute infarct, mass, midline shift, or extra-axial fluid collection. Confluent T2 hyperintensities in the cerebral white matter are similar in extent to the prior MRI and are nonspecific but compatible with severe chronic small vessel ischemic disease. There is Kieli Golladay chronic infarct in the left corona radiata which was acute on the prior MRI. Chronic infarcts are present in the corpus callosum and cerebellum, and there is mild-to-moderate chronic small vessel ischemia in the pons. There is moderately age advanced cerebral atrophy. Chronic microhemorrhages are present in the right frontal lobe, left thalamus, and cerebellum. Vascular: Major intracranial vascular flow voids are preserved. Skull and upper cervical spine: Unremarkable bone marrow signal. Sinuses/Orbits: Unremarkable orbits. Small mucous retention cyst in the right maxillary sinus. Clear mastoid air cells. Other: None. IMPRESSION: 1. No acute intracranial abnormality. 2. Severe chronic small vessel ischemic disease. Electronically Signed   By: Dasie Hamburg [name]   On: 10/09/2023 16:57   DG Chest Portable 1 View Result Date: 10/08/2023 CLINICAL DATA:  Weakness, altered level of consciousness EXAM: PORTABLE CHEST 1 VIEW COMPARISON:  09/04/2019 FINDINGS: Single frontal view of the chest demonstrates an unremarkable cardiac silhouette. No acute airspace disease, effusion, or pneumothorax. No acute bony abnormalities. IMPRESSION: 1. No acute intrathoracic process. Electronically Signed   By: Ozell Daring [name]   On: 10/08/2023 19:42   CT Cervical Spine Wo Contrast Result Date: 10/08/2023 CLINICAL DATA:  Altered level of consciousness EXAM: CT CERVICAL SPINE WITHOUT CONTRAST TECHNIQUE:  Multidetector CT imaging of the cervical spine was performed without intravenous contrast. Multiplanar CT image reconstructions were also generated. RADIATION DOSE REDUCTION: This exam was performed according to the departmental dose-optimization program which includes automated exposure control, adjustment of the mA and/or kV according to patient size and/or use of iterative reconstruction technique. COMPARISON:  None Available. FINDINGS: Alignment: Alignment is grossly anatomic. Skull base and vertebrae: No acute fracture. No primary bone lesion or focal pathologic process. Soft tissues and spinal canal: No prevertebral fluid  or swelling. No visible canal hematoma. Disc levels: Left predominant facet hypertrophic changes from C3-4 through C5-6. Minimal spondylosis at C4-5 and C5-6. Upper chest: Airway is patent. Visualized portions of the lung apices are clear. Other: Reconstructed images demonstrate no additional findings. IMPRESSION: 1. No acute cervical spine fracture. Electronically Signed   By: Ozell Daring [name]   On: 10/08/2023 18:28   CT HEAD WO CONTRAST Result Date: 10/08/2023 CLINICAL DATA:  Altered level of consciousness EXAM: CT HEAD WITHOUT CONTRAST TECHNIQUE: Contiguous axial images were obtained from the base of the skull through the vertex without intravenous contrast. RADIATION DOSE REDUCTION: This exam was performed according to the departmental dose-optimization program which includes automated exposure control, adjustment of the mA and/or kV according to patient size and/or use of iterative reconstruction technique. COMPARISON:  04/07/2023 FINDINGS: Brain: No evidence of acute infarct or hemorrhage. Chronic small vessel ischemic changes are seen throughout the periventricular white matter. Focal encephalomalacia has developed within the left corona radiata at the site of acute infarct noted on prior exam. Lateral ventricles and remaining midline structures are unremarkable. No acute  extra-axial fluid collections. No mass effect. Vascular: No hyperdense vessel or unexpected calcification. Skull: Normal. Negative for fracture or focal lesion. Sinuses/Orbits: No acute finding. Other: None. IMPRESSION: 1. Chronic ischemic changes throughout the periventricular white matter as above. 2. No acute intracranial process. Electronically Signed   By: Ozell Daring [name]   On: 10/08/2023 18:25        Scheduled Meds:  apixaban   5 mg Oral BID   aspirin  EC  81 mg Oral Daily   atorvastatin   40 mg Oral Daily   insulin  aspart  0-6 Units Subcutaneous TID WC   insulin  glargine  20 Units Subcutaneous Daily   lisinopril   40 mg Oral Daily   pantoprazole   40 mg Oral Daily   Continuous Infusions:  cefTRIAXone  (ROCEPHIN )  IV Stopped (10/09/23 1007)     LOS: 0 days    Time spent: over 30 min     Meliton Monte, MD [facility]   To contact the attending provider between 7A-7P or the covering provider during after hours 7P-7A, please log into the web site [email or web address] and access using universal Ewa Gentry password for that web site. If you do not have the password, please call the hospital operator.  10/09/2023, 5:08 PM

## 2023-10-09 NOTE — Progress Notes (Signed)
 PHARMACY NOTE:  ANTIMICROBIAL RENAL DOSAGE ADJUSTMENT  Current antimicrobial regimen includes a mismatch between antimicrobial dosage and estimated renal function.  As per policy approved by the Pharmacy & Therapeutics and Medical Executive Committees, the antimicrobial dosage will be adjusted accordingly.  Current antimicrobial dosage:  Ceftriaxone  1g IV q24  Indication: UTI  Renal Function:  CrCl cannot be calculated (Unknown ideal weight.). []      On intermittent HD, scheduled: []      On CRRT    Antimicrobial dosage has been changed to:  Ceftriaxone  2g IV q24 due to wt  Additional comments:   Sergio Batch, PharmD, BCIDP, AAHIVP, CPP Infectious Disease Pharmacist 10/09/2023 7:55 AM

## 2023-10-09 NOTE — Evaluation (Signed)
 Physical Therapy Evaluation Patient Details Name: [name] MRN: [id number] DOB: May 23, 1960 Today's Date: 10/09/2023  History of Present Illness  Pt is a 63 yo male presenting [facility] on [facility]08[facility]23 after feeling wobbly with AMS, found to have UTI. CT head unremarkable. PMH includes: HLD, HTN, DVT on Xarelto , Burkitts lymphoma, and prior L cerebellar stroke with baseline dysequilibrium.   Clinical Impression  Pt walking in the hallway with OT upon arrival with hand off for PT eval. PTA, pt would carry a SP cane for safety, however, would not use it for mobility. Pt reports having slight L sided weakness and difficulty speaking since prior CVA. Pt presents with L LE weakness, impaired dynamic balance/gait pattern, and decreased safety awareness. Pt was able to ambulate with CGA and SP cane, however, was unsteady with at one loss of balance requiring ModA to correct. Pt scored a 14/24 on the DGI indicating pt is at a higher risk of falling. Pt is also a high falls risk due to history of prior falls and decreased insight into deficits/safety awareness. Discussed using RW for improved balance with pt agreeing after discussion. Also discussed having family closely guard for steps and when moving in the home due to increased fall risk with pt verbalizing understanding. Recommending post-acute neuro OP PT to work towards independence with mobility. Pt will have 24/7 assist available upon returning home. Pt would benefit from acute skilled PT with current functional limitations listed below (see PT Problem List). Acute PT to follow.         If plan is discharge home, recommend the following: Assist for transportation;Help with stairs or ramp for entrance;A lot of help with walking and/or transfers;A lot of help with bathing/dressing/bathroom   Can travel by private vehicle    Yes    Equipment Recommendations None recommended by PT     Functional Status Assessment Patient has had a recent decline in  their functional status and demonstrates the ability to make significant improvements in function in a reasonable and predictable amount of time.     Precautions / Restrictions Precautions Precautions: Fall Recall of Precautions/Restrictions: Impaired Restrictions Weight Bearing Restrictions Per Provider Order: No      Mobility  Bed Mobility    General bed mobility comments: in hallway upon arrival    Transfers Overall transfer level: Needs assistance Equipment used: None Transfers: Sit to/from Stand Sit to Stand: Contact guard assist      General transfer comment: CGA for safety    Ambulation/Gait Ambulation/Gait assistance: Mod assist, Contact guard assist Gait Distance (Feet): 250 Feet Assistive device: Straight cane Gait Pattern/deviations: Step-through pattern, Decreased stride length, Staggering right, Staggering left, Decreased dorsiflexion - left Gait velocity: decr     General Gait Details: unsteady gait with pt occasionally staggering right/left with use of SP cane. Slightly decreased L ankle DF with pt occasionally stubbing L foot. ModA for one loss of balance after standing statically with reaching response with UE  Stairs Stairs: Yes Stairs assistance: Contact guard assist Stair Management: One rail Right, One rail Left, Step to pattern, Forwards Number of Stairs: 2 General stair comments: use of bilateral handrails with SP cane tucked underneath arm, CGA for safety     Modified Rankin (Stroke Patients Only) Modified Rankin (Stroke Patients Only) Pre-Morbid Rankin Score: No symptoms Modified Rankin: Moderately severe disability     Balance Overall balance assessment: Needs assistance, History of Falls Sitting-balance support: No upper extremity supported, Feet supported Sitting balance-Leahy Scale: Fair  Standing balance support: No upper extremity supported Standing balance-Leahy Scale: Fair    Standardized Balance Assessment Standardized  Balance Assessment : Dynamic Gait Index   Dynamic Gait Index Level Surface: Moderate Impairment Change in Gait Speed: Mild Impairment Gait with Horizontal Head Turns: Mild Impairment Gait with Vertical Head Turns: Mild Impairment Gait and Pivot Turn: Normal Step Over Obstacle: Moderate Impairment Step Around Obstacles: Mild Impairment Steps: Moderate Impairment Total Score: 14       Pertinent Vitals/Pain Pain Assessment Pain Assessment: Faces Faces Pain Scale: Hurts a little bit Pain Location: tailbone Pain Descriptors / Indicators: Aching Pain Intervention(s): Limited activity within patient's tolerance, Monitored during session, Repositioned    Home Living Family/patient expects to be discharged to:: Private residence Living Arrangements: Alone (2 dachaunsa) Available Help at Discharge: Family;Available PRN/intermittently Type of Home: House Home Access: Stairs to enter Entrance Stairs-Rails: Right;Left;Can reach both Entrance Stairs-Number of Steps: 4   Home Layout: One level Home Equipment: Cane - single point;Shower Counsellor (2 wheels) Additional Comments: Pt reports a couple falls within the last 6 months.    Prior Function Prior Level of Function : Independent/Modified Independent;Driving;History of Falls (last six months)    Mobility Comments: Carrys cane for safety but mostly does not use it. ADLs Comments: independent, driving.     Extremity/Trunk Assessment   Upper Extremity Assessment Upper Extremity Assessment: Defer to OT evaluation    Lower Extremity Assessment Lower Extremity Assessment: LLE deficits/detail LLE Deficits / Details: Hip flexion 3+/5, Knee ext 4+/5, Ankle DF 4+/5 LLE Sensation: WNL    Cervical / Trunk Assessment Cervical / Trunk Assessment: Normal  Communication   Communication Communication: Impaired Factors Affecting Communication: Reduced clarity of speech    Cognition Arousal: Alert Behavior During Therapy:  WFL for tasks assessed/performed   PT - Cognitive impairments: Awareness, Safety/Judgement    PT - Cognition Comments: decreased insight into deficits with decreased safety awareness Following commands: Intact       Cueing Cueing Techniques: Verbal cues     General Comments General comments (skin integrity, edema, etc.): Mother in room and supportive during eval     PT Assessment Patient needs continued PT services  PT Problem List Decreased strength;Decreased activity tolerance;Decreased balance;Decreased mobility;Decreased safety awareness       PT Treatment Interventions Gait training;DME instruction;Stair training;Functional mobility training;Therapeutic activities;Therapeutic exercise;Neuromuscular re-education;Balance training;Patient/family education    PT Goals (Current goals can be found in the Care Plan section)  Acute Rehab PT Goals Patient Stated Goal: to go home PT Goal Formulation: With patient Time For Goal Achievement: 10/23/23 Potential to Achieve Goals: Good    Frequency Min 3X/week        AM-PAC PT 6 Clicks Mobility  Outcome Measure Help needed turning from your back to your side while in a flat bed without using bedrails?: A Little Help needed moving from lying on your back to sitting on the side of a flat bed without using bedrails?: A Little Help needed moving to and from a bed to a chair (including a wheelchair)?: A Little Help needed standing up from a chair using your arms (e.g., wheelchair or bedside chair)?: A Little Help needed to walk in hospital room?: A Lot Help needed climbing 3-5 steps with a railing? : A Little 6 Click Score: 17    End of Session Equipment Utilized During Treatment: Gait belt Activity Tolerance: Patient tolerated treatment well Patient left: in chair;with call bell/phone within reach;with family/visitor present Nurse Communication: Mobility status PT Visit Diagnosis:  Unsteadiness on feet (R26.81);Other  abnormalities of gait and mobility (R26.89);Muscle weakness (generalized) (M62.81)    Time: 8671-8651 PT Time Calculation (min) (ACUTE ONLY): 20 min   Charges:   PT Evaluation $PT Eval Low Complexity: 1 Low   PT General Charges $$ ACUTE PT VISIT: 1 Visit         Kate ORN, PT, DPT Secure Chat Preferred  Rehab Office [phone]   Kate BRAVO Wendolyn 10/09/2023, 2:02 PM

## 2023-10-09 NOTE — Plan of Care (Signed)

## 2023-10-09 NOTE — Evaluation (Signed)
 Occupational Therapy Evaluation Patient Details Name: [name] MRN: [id number] DOB: 03-18-1960 Today's Date: 10/09/2023   History of Present Illness   Pt is a 63 yo male presenting to [facility] on 10/08/23 after feeling wobbly with AMS, found to have UTI. CT head unremarkable. PMH includes: HLD, HTN, DVT on Xarelto , Burkitts lymphoma, and prior L cerebellar stroke with baseline dysequilibrium.     Clinical Impressions Pt admitted for above, PTA pt was living alone and ind with mobility using SPC and ADLs/iADLs still driving as well. Pt currently with impaired balance, ambulating with CGA but noted to have inconsistent LOBs that he was able to correct himself but during transition to working with PT pt had a LOB needing mod A to correct. Pt overall CGA for ADLs as well. He would benefit from further work with OT to fully assess cognition. Recommend follow-up outpatient OT services for cognition.      If plan is discharge home, recommend the following:   Supervision due to cognitive status;A little help with walking and/or transfers     Functional Status Assessment   Patient has had a recent decline in their functional status and demonstrates the ability to make significant improvements in function in a reasonable and predictable amount of time.     Equipment Recommendations   None recommended by OT     Recommendations for Other Services         Precautions/Restrictions   Precautions Precautions: Fall Recall of Precautions/Restrictions: Impaired Restrictions Weight Bearing Restrictions Per Provider Order: No     Mobility Bed Mobility Overal bed mobility: Needs Assistance             General bed mobility comments: Pt in recliner upon arrival    Transfers Overall transfer level: Needs assistance Equipment used: None Transfers: Sit to/from Stand Sit to Stand: Contact guard assist           General transfer comment: CGA for safety, pt with near LOB  during transition but caught himself.      Balance Overall balance assessment: Needs assistance, History of Falls Sitting-balance support: No upper extremity supported, Feet supported Sitting balance-Leahy Scale: Fair     Standing balance support: No upper extremity supported Standing balance-Leahy Scale: Fair                             ADL either performed or assessed with clinical judgement   ADL Overall ADL's : Needs assistance/impaired Eating/Feeding: Independent;Sitting   Grooming: Standing;Oral care;Wash/dry hands;Contact guard assist   Upper Body Bathing: Sitting;Contact guard assist Upper Body Bathing Details (indicate cue type and reason): simulated Lower Body Bathing: Contact guard assist;Sit to/from stand Lower Body Bathing Details (indicate cue type and reason): simulated, pt with back propped against wall. Upper Body Dressing : Sitting;Set up   Lower Body Dressing: Sitting/lateral leans;Contact guard assist Lower Body Dressing Details (indicate cue type and reason): doff/don socks, increased effort. Toilet Transfer: Nurse, mental health and Hygiene: Contact guard assist;Sit to/from stand       Functional mobility during ADLs: Contact guard assist General ADL Comments: Pt recalls 1/3 activities prompted to complete during session.     Vision   Vision Assessment?: No apparent visual deficits;Yes Eye Alignment: Within Functional Limits Ocular Range of Motion: Within Functional Limits Alignment/Gaze Preference: Within Defined Limits Tracking/Visual Pursuits: Able to track stimulus in all quads without difficulty Convergence: Within functional limits Visual Fields: No apparent  deficits     Perception Perception: Within Functional Limits       Praxis Praxis: WFL       Pertinent Vitals/Pain Pain Assessment Pain Assessment: Faces Faces Pain Scale: Hurts a little bit Pain Location: tailbone Pain  Descriptors / Indicators: Aching Pain Intervention(s): Repositioned, Monitored during session     Extremity/Trunk Assessment Upper Extremity Assessment Upper Extremity Assessment: Overall WFL for tasks assessed;LUE deficits/detail LUE Deficits / Details: mild L incoordinaiton.   Lower Extremity Assessment Lower Extremity Assessment: LLE deficits/detail LLE Deficits / Details: Hip flexion 3+/5, Knee ext 4+/5, Ankle DF 4+/5 LLE Sensation: WNL   Cervical / Trunk Assessment Cervical / Trunk Assessment: Normal   Communication Communication Communication: Impaired Factors Affecting Communication: Reduced clarity of speech   Cognition Arousal: Alert Behavior During Therapy: WFL for tasks assessed/performed       Awareness: Online awareness impaired, Intellectual awareness impaired       OT - Cognition Comments: unsure of baseline, pt going back and forth with mother on events leading to admission. lack of sight into balance deficit.                 Following commands: Intact       Cueing  General Comments   Cueing Techniques: Verbal cues  Mother in room and supportive during eval   Exercises     Shoulder Instructions      Home Living Family/patient expects to be discharged to:: Private residence Living Arrangements: Alone (2 dachaunsa) Available Help at Discharge: Family;Available PRN/intermittently Type of Home: House Home Access: Stairs to enter [employer] of Steps: 4 Entrance Stairs-Rails: Right;Left;Can reach both Home Layout: One level     Bathroom Shower/Tub: Producer, television/film/video: Standard     Home Equipment: Cane - single point;Shower Counsellor (2 wheels)   Additional Comments: Pt reports a couple falls within the last 6 months.      Prior Functioning/Environment Prior Level of Function : Independent/Modified Independent;Driving;History of Falls (last six months)             Mobility Comments:  Carrys cane for safety but mostly does not use it. ADLs Comments: independent, driving.    OT Problem List: Impaired balance (sitting and/or standing);Decreased strength;Decreased cognition   OT Treatment/Interventions: Self-care/ADL training;Therapeutic exercise;Patient/family education;Balance training;Therapeutic activities;Cognitive remediation/compensation;DME and/or AE instruction      OT Goals(Current goals can be found in the care plan section)   Acute Rehab OT Goals Patient Stated Goal: go home OT Goal Formulation: With patient Time For Goal Achievement: 10/23/23 Potential to Achieve Goals: Good ADL Goals Pt Will Perform Grooming: with modified independence;standing Pt Will Perform Lower Body Bathing: with modified independence;sit to/from stand Pt Will Perform Lower Body Dressing: with modified independence;sit to/from stand Pt Will Transfer to Toilet: with modified independence;ambulating;regular height toilet Additional ADL Goal #1: Pt will score WFL on a medication assessment to determine ability to independently manage medications.   OT Frequency:  Min 2X/week    Co-evaluation              AM-PAC OT 6 Clicks Daily Activity     Outcome Measure Help from another person eating meals?: None Help from another person taking care of personal grooming?: A Little Help from another person toileting, which includes using toliet, bedpan, or urinal?: A Little Help from another person bathing (including washing, rinsing, drying)?: A Little Help from another person to put on and taking off regular upper body clothing?: A Little Help  from another person to put on and taking off regular lower body clothing?: A Little 6 Click Score: 19   End of Session Equipment Utilized During Treatment: Gait belt;Other (comment) [facility]) Nurse Communication: Mobility status  Activity Tolerance: Patient tolerated treatment well Patient left: Other (comment) (Pt left in care of providing PT  to work on hall ambulation.)  OT Visit Diagnosis: Unsteadiness on feet (R26.81);Other abnormalities of gait and mobility (R26.89);Other symptoms and signs involving cognitive function                Time: 8743-8674 OT Time Calculation (min): 29 min Charges:  OT General Charges $OT Visit: 1 Visit OT Evaluation $OT Eval Moderate Complexity: 1 Mod OT Treatments $Self Care/Home Management : 8-22 mins  10/09/2023  AB, OTR/L  Acute Rehabilitation Services  Office: [phone]   Curtistine JONETTA Das 10/09/2023, 3:25 PM

## 2023-10-09 NOTE — Plan of Care (Signed)
  Problem: Education: Goal: Ability to describe self-care measures that may prevent or decrease complications (Diabetes Survival Skills Education) will improve Outcome: Progressing Goal: Individualized Educational Video(s) Outcome: Progressing   Problem: Coping: Goal: Ability to adjust to condition or change in health will improve Outcome: Progressing   Problem: Fluid Volume: Goal: Ability to maintain a balanced intake and output will improve Outcome: Progressing   Problem: Health Behavior/Discharge Planning: Goal: Ability to identify and utilize available resources and services will improve Outcome: Progressing Goal: Ability to manage health-related needs will improve Outcome: Progressing   Problem: Metabolic: Goal: Ability to maintain appropriate glucose levels will improve Outcome: Progressing   Problem: Nutritional: Goal: Maintenance of adequate nutrition will improve Outcome: Progressing Goal: Progress toward achieving an optimal weight will improve Outcome: Progressing   Problem: Skin Integrity: Goal: Risk for impaired skin integrity will decrease Outcome: Progressing   Problem: Tissue Perfusion: Goal: Adequacy of tissue perfusion will improve Outcome: Progressing   Problem: Education: Goal: Knowledge of General Education information will improve Description: Including pain rating scale, medication(s)/side effects and non-pharmacologic comfort measures Outcome: Progressing   Problem: Health Behavior/Discharge Planning: Goal: Ability to manage health-related needs will improve Outcome: Progressing   Problem: Clinical Measurements: Goal: Ability to maintain clinical measurements within normal limits will improve Outcome: Progressing Goal: Will remain free from infection Outcome: Progressing Goal: Diagnostic test results will improve Outcome: Progressing Goal: Respiratory complications will improve Outcome: Progressing Goal: Cardiovascular complication will  be avoided Outcome: Progressing   Problem: Activity: Goal: Risk for activity intolerance will decrease Outcome: Progressing   Problem: Nutrition: Goal: Adequate nutrition will be maintained Outcome: Progressing   Problem: Coping: Goal: Level of anxiety will decrease Outcome: Progressing   Problem: Pain Managment: Goal: General experience of comfort will improve and/or be controlled Outcome: Progressing   Problem: Safety: Goal: Ability to remain free from injury will improve Outcome: Progressing   Problem: Skin Integrity: Goal: Risk for impaired skin integrity will decrease Outcome: Progressing

## 2023-10-09 NOTE — Progress Notes (Signed)
 [facility] night cross cover note:   I was notified by the patient's RN that the patient's temperature is elevated to one 1.6 after initially improving to 98 following 650 mg of acetaminophen  for initial temperature of 101.5, with RN conveying that the patient is not yet eligible for his next dose of acetaminophen  per existing order for acetaminophen  650 mg p.o. every 6 hours as needed.  Patient here with urinary tract infection for which she is on Rocephin .   Per chart review, most recent vital signs are also notable for heart rates in the 80s, systolic blood pressures in the 140s, respiratory rate 16, with the patient maintaining oxygen saturations in the mid 90s on room air.  Pertinent for review, it appears that his renal function is at baseline.  I subsequently ordered a one-time dose of iv toradol  f to further address the patient's elevated temperature. Additionally, moving forward, I've also increased the dose of his prn tylenol  to 1 g po q6h prn for fever.     Eva Pore, DO Hospitalist

## 2023-10-09 NOTE — Discharge Instructions (Signed)

## 2023-10-10 DIAGNOSIS — N3000 Acute cystitis without hematuria: Secondary | ICD-10-CM | POA: Diagnosis not present

## 2023-10-10 LAB — COMPREHENSIVE METABOLIC PANEL WITH GFR
ALT: 30 U/L (ref 0–44)
AST: 33 U/L (ref 15–41)
Albumin: 3.2 g/dL — ABNORMAL LOW (ref 3.5–5.0)
Alkaline Phosphatase: 37 U/L — ABNORMAL LOW (ref 38–126)
Anion gap: 10 (ref 5–15)
BUN: 14 mg/dL (ref 8–23)
CO2: 28 mmol/L (ref 22–32)
Calcium: 9 mg/dL (ref 8.9–10.3)
Chloride: 100 mmol/L (ref 98–111)
Creatinine, Ser: 1.03 mg/dL (ref 0.61–1.24)
GFR, Estimated: >60 mL/min (ref >60)
Glucose, Bld: 167 mg/dL — ABNORMAL HIGH (ref 70–99)
Potassium: 3.7 mmol/L (ref 3.5–5.1)
Sodium: 138 mmol/L (ref 135–145)
Total Bilirubin: 1.3 mg/dL — ABNORMAL HIGH (ref 0.0–1.2)
Total Protein: 6.3 g/dL — ABNORMAL LOW (ref 6.5–8.1)

## 2023-10-10 LAB — URINE CULTURE: Culture: <10000 — AB

## 2023-10-10 LAB — CBC WITH DIFFERENTIAL/PLATELET
Abs Immature Granulocytes: 0.02 K/uL (ref 0.00–0.07)
Basophils Absolute: 0 K/uL (ref 0.0–0.1)
Basophils Relative: 1 %
Eosinophils Absolute: 0.1 K/uL (ref 0.0–0.5)
Eosinophils Relative: 1 %
HCT: 48.1 % (ref 39.0–52.0)
Hemoglobin: 15.8 g/dL (ref 13.0–17.0)
Immature Granulocytes: 0 %
Lymphocytes Relative: 13 %
Lymphs Abs: 0.8 K/uL (ref 0.7–4.0)
MCH: 31 pg (ref 26.0–34.0)
MCHC: 32.8 g/dL (ref 30.0–36.0)
MCV: 94.5 fL (ref 80.0–100.0)
Monocytes Absolute: 0.6 K/uL (ref 0.1–1.0)
Monocytes Relative: 9 %
Neutro Abs: 4.5 K/uL (ref 1.7–7.7)
Neutrophils Relative %: 76 %
Platelets: 110 K/uL — ABNORMAL LOW (ref 150–400)
RBC: 5.09 MIL/uL (ref 4.22–5.81)
RDW: 13.4 % (ref 11.5–15.5)
WBC: 6 K/uL (ref 4.0–10.5)
nRBC: 0 % (ref 0.0–0.2)

## 2023-10-10 LAB — GLUCOSE, CAPILLARY
Glucose-Capillary: 144 mg/dL — ABNORMAL HIGH (ref 70–99)
Glucose-Capillary: 150 mg/dL — ABNORMAL HIGH (ref 70–99)

## 2023-10-10 LAB — PHOSPHORUS: Phosphorus: 3.3 mg/dL (ref 2.5–4.6)

## 2023-10-10 LAB — MAGNESIUM: Magnesium: 1.8 mg/dL (ref 1.7–2.4)

## 2023-10-10 NOTE — Discharge Summary (Signed)
 Physician Discharge Summary  [name] FMW:[id number] DOB: Feb 27, 1960 DOA: 10/08/2023  PCP: [name], Scott P, DO  Admit date: 10/08/2023 Discharge date: 10/10/2023  Time spent: 40 minutes  Recommendations for Outpatient Follow-up:  Follow outpatient CBC/CMP  Follow repeat UA/culture if recurrent symptoms  Discharge Diagnoses:  Principal Problem:   UTI (urinary tract infection) Active Problems:   Weakness   Diabetes mellitus (HCC)   Acute ischemic stroke (HCC)   Dysarthria   Burkitt's lymphoma (HCC)   Discharge Condition: stable  Diet recommendation: heart healthy, diabetic  There were no vitals filed for this visit.  History of present illness:   [name] is [name] 63 y.o. male with medical history significant for stroke 6 mo ago with residual dysarthria.   He presented after sitting down on his porch and being unable to get up.  Neighbor called EMS and he was thought to have focal neuro deficits.  He had fevers on presentation and UA finding concerning for UTI.  He's improved after abx.  MRI brain negative for stroke.  Stable for discharge at this time.  See below for additional details  Hospital Course:  Assessment and Plan:  Sepsis due to [name] UTI He's feeling better now after abx  UA consistent with UTI  Urine culture no growth (this was collected after antibiotics)  Will discharge on bactrim  - if he has recurrent symptoms, needs repeat UA/culture  Blood cultures were not collected    History of Stroke  Focal Neurologic Symptoms Suspect focal deficits reported may represent recrudescence of prior stroke symptoms in setting of his infection above (with negative MRI) Of note, he denied any focal neuro symptoms leading to his presentation (only sat down, unable to get up - ED triage note mentioned concern for focal symptoms) Continue eliquis , aspirin , statin MRI reassuring without acute intracranial abnormality   T2DM Resume home insulin     Hx DVT Eliquis      HTN Continue  lisinopril   GERD PPI    Procedures: none   Consultations: none  Discharge Exam: Vitals:   10/10/23 0358 10/10/23 0708  BP: 121/71 132/82  Pulse: 70 82  Resp: 20 18  Temp: 98.1 F (36.7 C) 98.1 F (36.7 C)  SpO2: 96% 98%   No new complaints Eager to discharge Feels at baseline  General: No acute distress. Cardiovascular: RRR Lungs: unlabored Abdomen: Soft, nontender, nondistended Neurological: Alert and oriented 3 - chronic dysarthria, moving all extremities Extremities: No clubbing or cyanosis. No edema.  Discharge Instructions   Discharge Instructions     Ambulatory referral to Physical Therapy   Complete by: As directed    Post acute neuro outpatient PT to work towards independence with mobility   Call MD for:  difficulty breathing, headache or visual disturbances   Complete by: As directed    Call MD for:  extreme fatigue   Complete by: As directed    Call MD for:  hives   Complete by: As directed    Call MD for:  persistant dizziness or light-headedness   Complete by: As directed    Call MD for:  persistant nausea and vomiting   Complete by: As directed    Call MD for:  redness, tenderness, or signs of infection (pain, swelling, redness, odor or green/yellow discharge around incision site)   Complete by: As directed    Call MD for:  severe uncontrolled pain   Complete by: As directed    Call MD for:  temperature >100.4   Complete  by: As directed    Diet - low sodium heart healthy   Complete by: As directed    Discharge instructions   Complete by: As directed    You have [name] urinary tract infection.  You've improved after we started antibiotics.  I'll send you home with bactrim , which is [name] first line antibiotic for urinary tract infections.  Your urine culture was negative (I think this was because you got antibiotics before your culture was collected).  If you have continued symptoms (fever, pain with urination, increased frequency,  hesitancy, etc), you'll need [name] repeat urinalysis and culture.  It's not 100% certain that bactrim  is the right antibiotic for your urinary tract infection.  Call your PCP Monday to schedule [name] follow up appointment this week to ensure you're continuing to improve.  Your platelets are low and should be followed outpatient.   I placed [name] referral for outpatient therapy.    Return for new, recurrent, or worsening symptoms.  Please ask your PCP to request records from this hospitalization so they know what was done and what the next steps will be.   Increase activity slowly   Complete by: As directed       Allergies as of 10/10/2023       Reactions   Codeine  Nausea Only   Hydrocodone-acetaminophen  Other (See Comments)   Makes patient sick   Vicodin [hydrocodone-acetaminophen ] Nausea And Vomiting        Medication List     TAKE these medications    aspirin  EC 81 MG tablet Take 81 mg by mouth daily. Swallow whole.   atorvastatin  40 MG tablet Commonly known as: LIPITOR Take 1 tablet (40 mg total) by mouth daily.   Dexcom G7 Sensor Misc Change every 10 days to monitor blood glucose continuously   dorzolamide -timolol  2-0.5 % ophthalmic solution Commonly known as: COSOPT  Place 1 drop into the right eye 2 (two) times daily.   Eliquis  5 MG Tabs tablet Generic drug: apixaban  Take 1 tablet (5 mg total) by mouth 2 (two) times daily.   insulin  lispro 100 UNIT/ML KwikPen Commonly known as: HUMALOG  Inject 2-10 Units into the skin 3 (three) times daily. Per sliding scale:   CHECK BLOOD SUGARS BEFORE EACH MEAL AND FOR BLOOD SUGARS 150-200 INJECT 2 UNITS, 200-250 INJECT 4 UNITS, 250-300 INJECT 6 UNITS, 300-350 INJECT 8 UNITS, >350 INJECT 10 UNITS CHECK BLOOD SUGARS BEFORE EACH MEAL AND FOR BLOOD SUGARS 150-200 INJECT 2 UNITS, 200-250 INJECT 4 UNITS, 250-300 INJECT 6 UNITS, 300-350 INJECT 8 UNITS, >350 INJECT 10 UNITS   Lancet Device Misc 1 each by Does not apply route 3 (three) times  daily. May dispense any manufacturer covered by patient's insurance.   Lantus  SoloStar 100 UNIT/ML Solostar Pen Generic drug: insulin  glargine Inject 24 Units into the skin daily.   lisinopril  40 MG tablet Commonly known as: ZESTRIL  Take 40 mg by mouth daily.   Magnesium  400 MG Caps Take 400 mg by mouth daily. Follow repeat labs with your PCP   multivitamin with minerals Tabs tablet Take 1 tablet by mouth daily.   omeprazole 20 MG capsule Commonly known as: PRILOSEC Take 20 mg by mouth daily.   OneTouch Delica Plus Lancet33G Misc 1 each 3 (three) times daily. Use as directed to check blood sugar. May dispense any manufacturer covered by patient's insurance and fits patient's device.   OneTouch Verio Flex System w/Device Kit 1 each by Does not apply route 3 (three) times daily. May dispense any manufacturer  covered by patient's insurance.   OneTouch Verio test strip Generic drug: glucose blood Use 1 each 3 (three) times daily as directed to check blood sugar. May dispense any manufacturer covered by patient's insurance and fits patient's device.   PROBIOTIC PO Take by mouth. *10 strain probiotic* contains 10 strains of beneficial bacteria with 10 mg b infantis   Rocklatan 0.02-0.005 % Soln Generic drug: Netarsudil-Latanoprost  Apply to eye.   sertraline 50 MG tablet Commonly known as: ZOLOFT Take 50 mg by mouth daily.   sulfamethoxazole -trimethoprim  800-160 MG tablet Commonly known as: Bactrim  DS Take 1 tablet by mouth 2 (two) times daily for 7 days. Follow up with your PCP outpatient to ensure this covers the urinary tract infection appropriately.   tamsulosin  0.4 MG Caps capsule Commonly known as: FLOMAX  Take 0.4 mg by mouth daily.   TechLite Plus Pen Needles 32G X 4 MM Misc Generic drug: Insulin  Pen Needle Use 1 each 3 (three) times daily. May dispense any manufacturer covered by patient's insurance.   testosterone  cypionate 200 MG/ML injection Commonly known  as: DEPOTESTOSTERONE CYPIONATE Inject 200 mg into the muscle every 14 (fourteen) days.       Allergies  Allergen Reactions   Codeine  Nausea Only   Hydrocodone-Acetaminophen  Other (See Comments)    Makes patient sick   Vicodin [Hydrocodone-Acetaminophen ] Nausea And Vomiting      The results of significant diagnostics from this hospitalization (including imaging, microbiology, ancillary and laboratory) are listed below for reference.    Significant Diagnostic Studies: MR BRAIN WO CONTRAST Result Date: 10/09/2023 CLINICAL DATA:  Neuro deficit, acute, stroke suspected. EXAM: MRI HEAD WITHOUT CONTRAST TECHNIQUE: Multiplanar, multiecho pulse sequences of the brain and surrounding structures were obtained without intravenous contrast. COMPARISON:  Head CT 10/08/2023 and MRI 04/08/2023 FINDINGS: Brain: There is no evidence of an acute infarct, mass, midline shift, or extra-axial fluid collection. Confluent T2 hyperintensities in the cerebral white matter are similar in extent to the prior MRI and are nonspecific but compatible with severe chronic small vessel ischemic disease. There is [place]berry [place] chronic infarct in the left corona radiata which was acute on the prior MRI. Chronic infarcts are present in the corpus callosum and cerebellum, and there is mild-to-moderate chronic small vessel ischemia in the pons. There is moderately age advanced cerebral atrophy. Chronic microhemorrhages are present in the right frontal lobe, left thalamus, and cerebellum. Vascular: Major intracranial vascular flow voids are preserved. Skull and upper cervical spine: Unremarkable bone marrow signal. Sinuses/Orbits: Unremarkable orbits. Small mucous retention cyst in the right maxillary sinus. Clear mastoid air cells. Other: None. IMPRESSION: 1. No acute intracranial abnormality. 2. Severe chronic small vessel ischemic disease. Electronically Signed   By: [name] [name]   On: 10/09/2023 16:57   DG Chest Portable 1 View Result  Date: 10/08/2023 CLINICAL DATA:  Weakness, altered level of consciousness EXAM: PORTABLE CHEST 1 VIEW COMPARISON:  09/04/2019 FINDINGS: Single frontal view of the chest demonstrates an unremarkable cardiac silhouette. No acute airspace disease, effusion, or pneumothorax. No acute bony abnormalities. IMPRESSION: 1. No acute intrathoracic process. Electronically Signed   By: [name] [name]   On: 10/08/2023 19:42   CT Cervical Spine Wo Contrast Result Date: 10/08/2023 CLINICAL DATA:  Altered level of consciousness EXAM: CT CERVICAL SPINE WITHOUT CONTRAST TECHNIQUE: Multidetector CT imaging of the cervical spine was performed without intravenous contrast. Multiplanar CT image reconstructions were also generated. RADIATION DOSE REDUCTION: This exam was performed according to the departmental dose-optimization program which includes automated exposure  control, adjustment of the mA and/or kV according to patient size and/or use of iterative reconstruction technique. COMPARISON:  None Available. FINDINGS: Alignment: Alignment is grossly anatomic. Skull base and vertebrae: No acute fracture. No primary bone lesion or focal pathologic process. Soft tissues and spinal canal: No prevertebral fluid or swelling. No visible canal hematoma. Disc levels: Left predominant facet hypertrophic changes from C3-4 through C5-6. Minimal spondylosis at C4-5 and C5-6. Upper chest: Airway is patent. Visualized portions of the lung apices are clear. Other: Reconstructed images demonstrate no additional findings. IMPRESSION: 1. No acute cervical spine fracture. Electronically Signed   By: [name] [name]   On: 10/08/2023 18:28   CT HEAD WO CONTRAST Result Date: 10/08/2023 CLINICAL DATA:  Altered level of consciousness EXAM: CT HEAD WITHOUT CONTRAST TECHNIQUE: Contiguous axial images were obtained from the base of the skull through the vertex without intravenous contrast. RADIATION DOSE REDUCTION: This exam was performed according to  the departmental dose-optimization program which includes automated exposure control, adjustment of the mA and/or kV according to patient size and/or use of iterative reconstruction technique. COMPARISON:  04/07/2023 FINDINGS: Brain: No evidence of acute infarct or hemorrhage. Chronic small vessel ischemic changes are seen throughout the periventricular white matter. Focal encephalomalacia has developed within the left corona radiata at the site of acute infarct noted on prior exam. Lateral ventricles and remaining midline structures are unremarkable. No acute extra-axial fluid collections. No mass effect. Vascular: No hyperdense vessel or unexpected calcification. Skull: Normal. Negative for fracture or focal lesion. Sinuses/Orbits: No acute finding. Other: None. IMPRESSION: 1. Chronic ischemic changes throughout the periventricular white matter as above. 2. No acute intracranial process. Electronically Signed   By: [name] [name]   On: 10/08/2023 18:25    Microbiology: Recent Results (from the past 240 hours)  Urine Culture     Status: Abnormal   Collection Time: 10/08/23  9:03 PM   Specimen: Urine, Clean Catch  Result Value Ref Range Status   Specimen Description URINE, CLEAN CATCH  Final   Special Requests NONE  Final   Culture ([name])  Final    <10,000 COLONIES/mL INSIGNIFICANT GROWTH Performed at [facility], [address], [place], [place] [address]    Report Status 10/10/2023 FINAL  Final  Resp panel by RT-PCR (RSV, Flu [name]&B, Covid) Anterior Nasal Swab     Status: None   Collection Time: 10/08/23 11:35 PM   Specimen: Anterior Nasal Swab  Result Value Ref Range Status   SARS Coronavirus 2 by RT PCR NEGATIVE NEGATIVE Final   Influenza [place] No by PCR NEGATIVE NEGATIVE Final   Influenza B by PCR NEGATIVE NEGATIVE Final    Comment: (NOTE) The Xpert Xpress SARS-CoV-2/FLU/RSV plus assay is intended as an aid in the diagnosis of influenza from Nasopharyngeal swab specimens and should not  be used as [place] sole basis for treatment. Nasal washings and aspirates are unacceptable for Xpert Xpress SARS-CoV-2/FLU/RSV testing.  Fact Sheet for Patients: BloggerCourse.com  Fact Sheet for Healthcare Providers: SeriousBroker.it  This test is not yet approved or cleared by the [place]  FDA and has been authorized for detection and/or diagnosis of SARS-CoV-2 by FDA under an Emergency Use Authorization (EUA). This EUA will remain in effect (meaning this test can be used) for the duration of the COVID-19 declaration under Section 564(b)(1) of the Act, 21 U.S.C. section 360bbb-3(b)(1), unless the authorization is terminated or revoked.     Resp Syncytial Virus by PCR NEGATIVE NEGATIVE Final  Comment: (NOTE) Fact Sheet for Patients: BloggerCourse.com  Fact Sheet for Healthcare Providers: SeriousBroker.it  This test is not yet approved or cleared by the [place]  FDA and has been authorized for detection and/or diagnosis of SARS-CoV-2 by FDA under an Emergency Use Authorization (EUA). This EUA will remain in effect (meaning this test can be used) for the duration of the COVID-19 declaration under Section 564(b)(1) of the Act, 21 U.S.C. section 360bbb-3(b)(1), unless the authorization is terminated or revoked.  Performed at [facility], [address], [place], [place] [address]      Labs: Basic Metabolic Panel: Recent Labs  Lab 10/08/23 1743 10/09/23 0810 10/10/23 0340  NA 139 138 138  K 4.1 3.8 3.7  CL 103 104 100  CO2 22 24 28   GLUCOSE 180* 159* 167*  BUN 15 14 14   CREATININE 1.11 1.08 1.03  CALCIUM  9.6 9.1 9.0  MG  --  1.6* 1.8  PHOS  --   --  3.3   Liver Function Tests: Recent Labs  Lab 10/08/23 1743 10/10/23 0340  AST 36 33  ALT 26 30  ALKPHOS 45 37*  BILITOT 2.0* 1.3*  PROT 7.1 6.3*  ALBUMIN 3.9 3.2*   No results for input(s):  LIPASE, AMYLASE in the last 168 hours. No results for input(s): AMMONIA in the last 168 hours. CBC: Recent Labs  Lab 10/08/23 1743 10/09/23 0810 10/10/23 0340  WBC 9.4 6.9 6.0  NEUTROABS 8.4*  --  4.5  HGB 16.3 16.2 15.8  HCT 48.8 47.4 48.1  MCV 94.2 94.0 94.5  PLT 150 122* 110*   Cardiac Enzymes: No results for input(s): CKTOTAL, CKMB, CKMBINDEX, TROPONINI in the last 168 hours. BNP: BNP (last 3 results) No results for input(s): BNP in the last 8760 hours.  ProBNP (last 3 results) No results for input(s): PROBNP in the last 8760 hours.  CBG: Recent Labs  Lab 10/09/23 1218 10/09/23 1615 10/09/23 2144 10/10/23 0620 10/10/23 0737  GLUCAP 225* 164* 226* 144* 150*       Signed:  Meliton Monte MD.  [facility] 10/10/2023, 10:55 AM

## 2023-11-10 ENCOUNTER — Ambulatory Visit: Admitting: Physical Therapy

## 2023-11-12 ENCOUNTER — Encounter (HOSPITAL_COMMUNITY): Payer: Self-pay

## 2023-11-12 NOTE — Telephone Encounter (Signed)
 Copied from CRM #[id number]. Topic: Medication/Rx - Rx Refill >> Nov 12, 2023 12:12 PM Porsha F wrote: [place], [place] is calling for medication request (Obtain: Medication Name, Dosage, Quantity, Frequency, Quantity Requested (example: 30-day supply.)   Include all details related to the request(s) below:  Requesting a refill:   atorvastatin  (LIPITOR) 40 mg tablet [[id number]]    Confirm and type the Best Contact Number below:  Patient/caller contact number:          [phone]   [] Home  [x] Mobile  [] Work  []  Other   [x]  Okay to leave a voicemail   Medication List:  Current Outpatient Medications:  .  amLODIPine (NORVASC) 5 mg tablet, Take 1 tablet (5 mg total) by mouth daily., Disp: 90 tablet, Rfl: 0 .  aspirin  81 mg EC tablet, Take 81 mg by mouth daily., Disp: , Rfl:  .  atorvastatin  (LIPITOR) 40 mg tablet, Take 1 tablet (40 mg total) by mouth at bedtime., Disp: 90 tablet, Rfl: 1 .  blood-glucose meter,continuous (Dexcom G7 Receiver) misc, PATIENT NEEDS RX FOR DEXCOM G7 TRANSMITTER HIS QUIT WORKING DX-E11.65 30 DAYS, Disp: , Rfl:  .  blood-glucose sensor (Dexcom G7 Sensor), Use as directed to monitor blood sugar levels. Follow package directions for replacement., Disp: 3 each, Rfl: 11 .  dorzolamide -timoloL  (COSOPT ) 22.3-6.8 mg/mL ophthalmic solution, INSTILL ONE DROP IN BOTH EYES TWICE A DAY, Disp: , Rfl:  .  Eliquis  5 mg tab, TAKE 1 TABLET BY MOUTH TWICE A DAY, Disp: 60 tablet, Rfl: 2 .  fexofenadine (Allegra Hives) 180 mg tablet, 1 tablet Swallow whole with water; do not take with fruit juices. Orally Once a day for 30 day(s), Disp: , Rfl:  .  fluticasone propionate (FLONASE) 50 mcg/spray nasal spray, Administer 2 sprays into each nostril daily., Disp: 15.8 mL, Rfl: 2 .  insulin  glargine (Lantus  Solostar U-100 Insulin ) 100 unit/mL (3 mL) pen, Inject 26 Units under the skin daily., Disp: 30 mL, Rfl: 3 .  insulin  lispro (HumaLOG  KwikPen Insulin ) 100 unit/mL KwikPen, Inject  10 Units under the skin 3 (three) times a day before meals. CHECK BLOOD SUGARS BEFORE EACH MEAL AND FOR BLOOD SUGARS 150-200 INJECT 2 UNITS, 200-250 INJECT 4 UNITS, 250-300 INJECT 6 UNITS, 300-350 INJECT 8 UNITS, >350 INJECT 10 UNITS, Disp: 30 mL, Rfl: 3 .  Lactobac. rhamnosus GG-inulin (CULTURELLE) 10 billion cell -200 mg cpSP, Take 1 capsule by mouth daily., Disp: , Rfl:  .  latanoprost  (XALATAN ) 0.005 % ophthalmic solution, INT 1 GTT INTO OU HS, Disp: , Rfl:  .  lisinopriL  (PRINIVIL ) 40 mg tablet, Take 1 tablet (40 mg total) by mouth daily., Disp: 90 tablet, Rfl: 4 .  moxifloxacin (VIGAMOX) 0.5 % ophthalmic solution, INSTILL 1 DROP INTO AFFECTED EYE 3 TIMES A DAY FOR 7 DAYS, Disp: , Rfl:  .  omeprazole (PriLOSEC) 20 mg DR capsule, Take 20 mg by mouth Once Daily., Disp: , Rfl:  .  OneTouch Delica Plus Lancet 33 gauge misc, , Disp: , Rfl:  .  OneTouch Verio Flex meter misc, , Disp: , Rfl:  .  OneTouch Verio test strips test strip, , Disp: , Rfl:  .  promethazine-dextromethorphan (PHENERGAN DM) 6.25-15 mg/5 mL syrp syrup, Take 5 mL by mouth nightly as needed (cough)., Disp: 100 mL, Rfl: 0 .  Rocklatan 0.02-0.005 % drop, Administer 1 drop into each eyes nightly., Disp: , Rfl:  .  sertraline (ZOLOFT) 50 mg tablet, TAKE 1 TABLET BY MOUTH EVERY DAY, Disp: 30 tablet,  Rfl: 2 .  tamsulosin  (FLOMAX ) 0.4 mg cap, Take 0.4 mg by mouth daily., Disp: , Rfl:  .  testosterone  cypionate (DEPO-TESTOSTERONE ) 200 mg/mL injection, Inject 0.75 mL (150 mg total) into the muscle every 14 (fourteen) days., Disp: 2 mL, Rfl: 2     Medication Request/Refills: Pharmacy Information (if applicable)   []  Not Applicable       [x]  Pharmacy listed  Send Medication Request to: [id number] - [place], [place] - [address] - PHONE: [phone] - FAX: [phone]                                                 []  Pharmacy not listed (added to pharmacy list in Epic) Send Medication  Request to:      Listed Pharmacies: [id number] - [place], [place] - [address] - PHONE: [phone] - FAX: [phone]

## 2023-11-12 NOTE — Progress Notes (Signed)
 Surgical Instructions   Your procedure is scheduled on Thursday November 18, 2023. Report to [facility] Main Entrance A at 5:30 A.M., then check in with the Admitting office. Any questions or running late day of surgery: call [phone]  Questions prior to your surgery date: call [phone], Monday-Friday, 8am-4pm. If you experience any cold or flu symptoms such as cough, fever, chills, shortness of breath, etc. between now and your scheduled surgery, please notify us  at the above number.     Remember:  Do not eat after midnight the night before your surgery   You may drink clear liquids until 4:30 the morning of your surgery.   Clear liquids allowed are: Water, Non-Citrus Juices (without pulp), Carbonated Beverages, Clear Tea (no milk, honey, etc.), Black Coffee Only (NO MILK, CREAM OR POWDERED CREAMER of any kind), and Gatorade.    Take these medicines the morning of surgery with A SIP OF WATER  acetaminophen  (TYLENOL )  atorvastatin  (LIPITOR)  dorzolamide -timolol  (COSOPT ) 22.3-6.8 MG/ML ophthalmic solution  Netarsudil-Latanoprost  (ROCKLATAN) 0.02-0.005 % SOLN  omeprazole (PRILOSEC)  sertraline (ZOLOFT)   May take these medicines IF NEEDED: NONE  Follow your surgeon's instructions on when to stop apixaban  (ELIQUIS ) and Aspirin  .  If no instructions were given by your surgeon then you will need to call the office to get those instructions.     One week prior to surgery, STOP taking any Aleve, Naproxen, Ibuprofen, Motrin, Advil, Goody's, BC's, all herbal medications, fish oil, and non-prescription vitamins.   WHAT DO I DO ABOUT MY DIABETES MEDICATION?   Do not take oral diabetes medicines (pills) the morning of surgery.  THE MORNING OF SURGERY TAKE 13 UNITS OF LANTUS  SOLOSTAR, WHICH IS 50% OF YOUR REGULAR DOSE.  ,  THE MORNING OF SURGERY TAKE ZERO UNITS OF insulin  lispro (HUMALOG ). If your CBG is greater than 220 mg/dL, you may take  of your sliding scale (correction)  dose of insulin .   The day of surgery, do not take other diabetes injectables, including Byetta (exenatide), Bydureon (exenatide ER), Victoza (liraglutide), or Trulicity (dulaglutide).   HOW TO MANAGE YOUR DIABETES BEFORE AND AFTER SURGERY  Why is it important to control my blood sugar before and after surgery? Improving blood sugar levels before and after surgery helps healing and can limit problems. A way of improving blood sugar control is eating a healthy diet by:  Eating less sugar and carbohydrates  Increasing activity/exercise  Talking with your doctor about reaching your blood sugar goals High blood sugars (greater than 180 mg/dL) can raise your risk of infections and slow your recovery, so you will need to focus on controlling your diabetes during the weeks before surgery. Make sure that the doctor who takes care of your diabetes knows about your planned surgery including the date and location.  How do I manage my blood sugar before surgery? Check your blood sugar at least 4 times a day, starting 2 days before surgery, to make sure that the level is not too high or low.  Check your blood sugar the morning of your surgery when you wake up and every 2 hours until you get to the Short Stay unit.  If your blood sugar is less than 70 mg/dL, you will need to treat for low blood sugar: Do not take insulin . Treat a low blood sugar (less than 70 mg/dL) with  cup of clear juice (cranberry or apple), 4 glucose tablets, OR glucose gel. Recheck blood sugar in 15 minutes after treatment (to make sure  it is greater than 70 mg/dL). If your blood sugar is not greater than 70 mg/dL on recheck, call [phone] for further instructions. Report your blood sugar to the short stay nurse when you get to Short Stay.  If you are admitted to the hospital after surgery: Your blood sugar will be checked by the staff and you will probably be given insulin  after surgery (instead of oral diabetes medicines)  to make sure you have good blood sugar levels. The goal for blood sugar control after surgery is 80-180 mg/dL.                      Do NOT Smoke (Tobacco/Vaping) for 24 hours prior to your procedure.  If you use a CPAP at night, you may bring your mask/headgear for your overnight stay.   You will be asked to remove any contacts, glasses, piercing's, hearing aid's, dentures/partials prior to surgery. Please bring cases for these items if needed.    Patients discharged the day of surgery will not be allowed to drive home, and someone needs to stay with them for 24 hours.  SURGICAL WAITING ROOM VISITATION Patients may have no more than 2 support people in the waiting area - these visitors may rotate.   Pre-op nurse will coordinate an appropriate time for 1 ADULT support person, who may not rotate, to accompany patient in pre-op.  Children under the age of 52 must have an adult with them who is not the patient and must remain in the main waiting area with an adult.  If the patient needs to stay at the hospital during part of their recovery, the visitor guidelines for inpatient rooms apply.  Please refer to the [facility] website for the visitor guidelines for any additional information.   If you received a COVID test during your pre-op visit  it is requested that you wear a mask when out in public, stay away from anyone that may not be feeling well and notify your surgeon if you develop symptoms. If you have been in contact with anyone that has tested positive in the last 10 days please notify you surgeon.      Pre-operative CHG Bathing Instructions   You can play a key role in reducing the risk of infection after surgery. Your skin needs to be as free of germs as possible. You can reduce the number of germs on your skin by washing with CHG (chlorhexidine  gluconate) soap before surgery. CHG is an antiseptic soap that kills germs and continues to kill germs even after washing.   DO NOT  use if you have an allergy to chlorhexidine /CHG or antibacterial soaps. If your skin becomes reddened or irritated, stop using the CHG and notify one of our RNs at [phone].              TAKE A SHOWER THE NIGHT BEFORE SURGERY   Please keep in mind the following:   You may shave your face before/day of surgery.  Place clean sheets on your bed the night before surgery Use a clean washcloth (not used since being washed) for shower. DO NOT sleep with pet's night before surgery.  CHG Shower Instructions:  Wash your face and private area with normal soap. If you choose to wash your hair, wash first with your normal shampoo.  After you use shampoo/soap, rinse your hair and body thoroughly to remove shampoo/soap residue.  Turn the water OFF and apply half the bottle of CHG soap to  a Animal nutritionist.  Apply CHG soap ONLY FROM YOUR NECK DOWN TO YOUR TOES (washing for 3-5 minutes)  DO NOT use CHG soap on face, private areas, open wounds, or sores.  Pay special attention to the area where your surgery is being performed.  If you are having back surgery, having someone wash your back for you may be helpful. Wait 2 minutes after CHG soap is applied, then you may rinse off the CHG soap.  Pat dry with a clean towel  Put on clean pajamas    Additional instructions for the day of surgery: If you choose, you may shower the morning of surgery with an antibacterial soap.  DO NOT APPLY any lotions, deodorants or cologne.   Do not wear jewelry Do not bring valuables to the hospital. [facility] is not responsible for valuables/personal belongings. Put on clean/comfortable clothes.  Please brush your teeth.  Ask your nurse before applying any prescription medications to the skin.

## 2023-11-15 ENCOUNTER — Encounter (HOSPITAL_COMMUNITY): Payer: Self-pay

## 2023-11-15 ENCOUNTER — Other Ambulatory Visit: Payer: Self-pay

## 2023-11-15 ENCOUNTER — Encounter (HOSPITAL_COMMUNITY)
Admission: RE | Admit: 2023-11-15 | Discharge: 2023-11-15 | Disposition: A | Discharge status: Home / Self Care | Source: Ambulatory Visit | Attending: General Surgery | Admitting: General Surgery

## 2023-11-15 VITALS — BP 171/94 | HR 71 | Temp 97.9°F | Resp 18 | Ht 73.0 in | Wt 220.5 lb

## 2023-11-15 DIAGNOSIS — Z7982 Long term (current) use of aspirin: Secondary | ICD-10-CM | POA: Diagnosis not present

## 2023-11-15 DIAGNOSIS — E785 Hyperlipidemia, unspecified: Secondary | ICD-10-CM | POA: Diagnosis not present

## 2023-11-15 DIAGNOSIS — Z8572 Personal history of non-Hodgkin lymphomas: Secondary | ICD-10-CM | POA: Insufficient documentation

## 2023-11-15 DIAGNOSIS — Z794 Long term (current) use of insulin: Secondary | ICD-10-CM | POA: Insufficient documentation

## 2023-11-15 DIAGNOSIS — K429 Umbilical hernia without obstruction or gangrene: Secondary | ICD-10-CM | POA: Diagnosis not present

## 2023-11-15 DIAGNOSIS — E1129 Type 2 diabetes mellitus with other diabetic kidney complication: Secondary | ICD-10-CM | POA: Insufficient documentation

## 2023-11-15 DIAGNOSIS — H42 Glaucoma in diseases classified elsewhere: Secondary | ICD-10-CM | POA: Diagnosis not present

## 2023-11-15 DIAGNOSIS — Z7901 Long term (current) use of anticoagulants: Secondary | ICD-10-CM | POA: Diagnosis not present

## 2023-11-15 DIAGNOSIS — Z8673 Personal history of transient ischemic attack (TIA), and cerebral infarction without residual deficits: Secondary | ICD-10-CM | POA: Diagnosis not present

## 2023-11-15 DIAGNOSIS — E1139 Type 2 diabetes mellitus with other diabetic ophthalmic complication: Secondary | ICD-10-CM | POA: Insufficient documentation

## 2023-11-15 DIAGNOSIS — H409 Unspecified glaucoma: Secondary | ICD-10-CM | POA: Insufficient documentation

## 2023-11-15 DIAGNOSIS — I10 Essential (primary) hypertension: Secondary | ICD-10-CM | POA: Diagnosis not present

## 2023-11-15 DIAGNOSIS — Z01818 Encounter for other preprocedural examination: Secondary | ICD-10-CM | POA: Diagnosis present

## 2023-11-15 DIAGNOSIS — F17210 Nicotine dependence, cigarettes, uncomplicated: Secondary | ICD-10-CM | POA: Diagnosis not present

## 2023-11-15 DIAGNOSIS — D751 Secondary polycythemia: Secondary | ICD-10-CM | POA: Diagnosis not present

## 2023-11-15 DIAGNOSIS — Z01812 Encounter for preprocedural laboratory examination: Secondary | ICD-10-CM | POA: Insufficient documentation

## 2023-11-15 HISTORY — DX: Type 2 diabetes mellitus without complications: E11.9

## 2023-11-15 LAB — CBC
HCT: 49.2 % (ref 39.0–52.0)
Hemoglobin: 16.1 g/dL (ref 13.0–17.0)
MCH: 30.5 pg (ref 26.0–34.0)
MCHC: 32.7 g/dL (ref 30.0–36.0)
MCV: 93.2 fL (ref 80.0–100.0)
Platelets: 170 K/uL (ref 150–400)
RBC: 5.28 MIL/uL (ref 4.22–5.81)
RDW: 14.1 % (ref 11.5–15.5)
WBC: 7.9 K/uL (ref 4.0–10.5)
nRBC: 0 % (ref 0.0–0.2)

## 2023-11-15 LAB — BASIC METABOLIC PANEL WITH GFR
Anion gap: 8 (ref 5–15)
BUN: 13 mg/dL (ref 8–23)
CO2: 27 mmol/L (ref 22–32)
Calcium: 9.6 mg/dL (ref 8.9–10.3)
Chloride: 102 mmol/L (ref 98–111)
Creatinine, Ser: 0.96 mg/dL (ref 0.61–1.24)
GFR, Estimated: >60 mL/min (ref >60)
Glucose, Bld: 90 mg/dL (ref 70–99)
Potassium: 4.1 mmol/L (ref 3.5–5.1)
Sodium: 137 mmol/L (ref 135–145)

## 2023-11-15 LAB — HEMOGLOBIN A1C
Hgb A1c MFr Bld: 8.2 % — ABNORMAL HIGH (ref 4.8–5.6)
Mean Plasma Glucose: 188.64 mg/dL

## 2023-11-15 LAB — GLUCOSE, CAPILLARY: Glucose-Capillary: 119 mg/dL — ABNORMAL HIGH (ref 70–99)

## 2023-11-15 NOTE — Progress Notes (Addendum)
 PCP - Dr. Shawn Lazoff -  gave ASA/eliquis  directions on 09/20/23 Cardiologist - Denies   PPM/ICD - Denies Device Orders - n/a Rep Notified - n/a  Chest x-ray - 10-08-23 EKG - 10-08-23 Stress Test - 09-07-14 ECHO - 04-08-23 Cardiac Cath - denies  Sleep Study - Denies CPAP - n/a  Fasting Blood Sugar - 140's Checks Blood Sugar: per pt wears a dexcom 4 on right arm  Last dose of GLP1 agonist-  Denies GLP1 instructions: n/a  Blood Thinner Instructions: apixaban  (ELIQUIS ) - per patient per surgeon last dose on 11/13/23 Aspirin  Instructions:per patient per surgeon last dose on 11/10/23.  ERAS Protcol - npo PRE-SURGERY Ensure or G2- none  COVID TEST- n/a   Anesthesia review: yes HTN, stroke  Patient denies shortness of breath, fever, cough and chest pain at PAT appointment. Patient denies any respiratory issues at this time.    All instructions explained to the patient, with a verbal understanding of the material. Patient agrees to go over the instructions while at home for a better understanding. Patient also instructed to self quarantine after being tested for COVID-19. The opportunity to ask questions was provided.

## 2023-11-16 NOTE — Progress Notes (Signed)
 Anesthesia Chart Review:  Case: [id number] Date/Time: 11/18/23 0715   Procedure: REPAIR, HERNIA, UMBILICAL, LAPAROSCOPIC   Anesthesia type: General   Diagnosis: Umbilical hernia without obstruction or gangrene [K42.9]   Pre-op diagnosis: UMBILICAL HERNIA   Location: [facility] OR ROOM 09 / [facility] OR   Surgeons: Rubin Calamity, MD       DISCUSSION: Patient is a 63 year old male scheduled for the above procedure.  History includes smoker, HTN, HLD, DM2, Burkitt's lymphoma (2007 s/p chemotherapy), CVA (chronic cerebellar infarct 10/23/2014 MRI, right brain CVA 09/2019; left corona radiata infarct 04/07/2023), LLE DVT (12/2015), secondary polycythemia (felt r/t testosterone  injections), cerebral aneurysm, hernia (left IHR 11/22/2008), glaucoma.  Admitted 10/08/2023-10/10/2023 for weakness, UTI, sepsis. Urine culture (sent after antibiotics) showed insignificant growth. UDS was negative. Brain MRI showed severe chronic small vessel ischemic disease, but no acute intracranial abnormality. CXR showed no acute process. He was discharged on Bactrim , and if repeat symptoms would resent urine culture. He had PCP follow-up with Dr Lazoff on 10/21/2023 who thought UTI had cleared and was back to baseline.  He had previous preoperative surgical clearance visit with Dr. Lazoff on 09/20/2023. He discussed holding ASA 7 days before surgery and holding Eliquis  2 days before surgery and resume when okay per surgeon. Advised referral to endocrinology for A1c 8.6% (down from 10% on 07/05/2023).  He had endocrinology evaluation with Warren Batty, NP on 10/20/2023. DM meds then were Lantus  24 units daily and Humalog  10 units (although was not taking regularly, only ~ Q 2-3 days). He had tried metformin, glipizide , Ozempic, and Jardiance  in the past but discontinued due to side effects or other issues.  She advised continue Dexcom G7 GCM. Lantus  increased to 26 units daily. Advised to take Humalog  10 units right before meals. Follow-up in 3  months planned.   He reported last Eliquis  per surgeon 11/13/2023 and aspirin  11/10/2023.  Anesthesia team to evaluate on the day of surgery.   VS: BP (!) 171/94   Pulse 71   Temp 36.6 C   Resp 18   Ht 6' 1 (1.854 m)   Wt 100 kg   SpO2 97%   BMI 29.09 kg/m  BP 131/72 on 10/21/2023 (Dr. Macarthur)   PROVIDERS: Lazoff, Elouise SQUIBB, DO his PCP Lanis Pupa, MD is neurosurgeon (cerebral aneurysm) Batty Warren, NP is endocrinology provider Rosemarie Rothman, MD is neurologist. Last visit 05/11/2023 with Whitfield Raisin, NP. She wrote, No further recommendations from stroke standpoint and is routinely followed by PCP. Recommend follow-up on an as-needed basis. Continue follow-up with Neurosurgery for cerebral aneurysm (stable on 04/2023 MRA). Deanne Arley NOVAK, MD is HEM-ONC. Last visit 11/01/2018. Patient remained in remission from Burkitt's lymphoma.  He will continue primary care follow-up with as needed follow-up at the [facility].    LABS: Labs reviewed: Acceptable for surgery. AST 33, ALT 30 on 10/10/2023.  (all labs ordered are listed, but only abnormal results are displayed)  Labs Reviewed  GLUCOSE, CAPILLARY - Abnormal; Notable for the following components:      Result Value   Glucose-Capillary 119 (*)    All other components within normal limits  HEMOGLOBIN A1C - Abnormal; Notable for the following components:   Hgb A1c MFr Bld 8.2 (*)    All other components within normal limits  CBC  BASIC METABOLIC PANEL WITH GFR    IMAGES: MRI Brain 10/09/2023: IMPRESSION: 1. No acute intracranial abnormality. 2. Severe chronic small vessel ischemic disease.  1V PCXR 10/08/2023: FINDINGS: Single frontal view of the chest  demonstrates an unremarkable cardiac silhouette. No acute airspace disease, effusion, or pneumothorax. No acute bony abnormalities. IMPRESSION: 1. No acute intrathoracic process.  CT C-spine 10/08/2023: IMPRESSION: 1. No acute cervical spine fracture.  MRI/MRA Head  04/08/2023: IMPRESSION: 1. Acute infarct in the left corona radiata. 2. 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 3. 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 4. Advanced chronic microvascular ischemic disease.    EKG: 10/08/2023: Sinus tachycardia at 113 bpm No significant change since last tracing Confirmed by Randol Simmonds [phone]) on 10/08/2023 5:34:38 PM   CV: Echo 04/08/2023: IMPRESSIONS   1. Left ventricular ejection fraction, by estimation, is 55 to 60%. The  left ventricle has normal function. The left ventricle has no regional  wall motion abnormalities. Left ventricular diastolic parameters were  normal.   2. Right ventricular systolic function is normal. The right ventricular  size is normal.   3. The mitral valve is normal in structure. Trivial mitral valve  regurgitation.   4. The aortic valve is tricuspid. Aortic valve regurgitation is mild.  - Comparison(s): The left ventricular function is unchanged.    Nuclear stress test 09/07/2014: Nuclear stress EF: 48%. The left ventricular ejection fraction is mildly decreased (45-54%). There was no ST segment deviation noted during stress. This is a low risk study.  Low risk stress nuclear study with no evidence of ischemia or infarction; EF 48 but visually appears better; suggest echocardiogram to further assess. Note patient only achieved 75% THR; ischemia at higher workloads cannot be excluded.  Past Medical History:  Diagnosis Date   Abnormal liver function test 08/31/2011   Mild elevation bilirubin, normal/high hemoglobin, normal LDH   Adult ADHD    Aneurysm 10/23/2014   4mm left anterior communicating artery region, noted on MRI head   Anxiety    Burkitt's lymphoma (HCC)    followed by Dr. Arvella Hof   Cerebellar infarct [facility]) 10/23/2014   Chronic, noted on MRI head   Chest pain    Depression     Diabetes mellitus without complication (HCC)    DVT (deep venous thrombosis) (HCC) 12/2015   Left leg   Elevated PSA    Glaucoma    Headache    History of double vision 2016   HTN (hypertension), benign 08/31/2011   Hyperlipidemia    Hypotestosteronemia    Polycythemia, secondary 08/31/2011   Likely due to testosterone  injections   Stroke (HCC)    White matter disease 10/23/2014   noted on MRI head, extensive    Past Surgical History:  Procedure Laterality Date   CEREBRAL ANGIOGRAM  11/23/2014   HERNIA REPAIR     KNEE SURGERY Left    SHOULDER SURGERY      MEDICATIONS:  acetaminophen  (TYLENOL ) 650 MG CR tablet   apixaban  (ELIQUIS ) 5 MG TABS tablet   aspirin  EC 81 MG tablet   atorvastatin  (LIPITOR) 40 MG tablet   Blood Glucose Monitoring Suppl (BLOOD GLUCOSE MONITOR SYSTEM) w/Device KIT   Continuous Glucose Sensor (DEXCOM G7 SENSOR) MISC   dorzolamide -timolol  (COSOPT ) 22.3-6.8 MG/ML ophthalmic solution   Glucose Blood (BLOOD GLUCOSE TEST STRIPS) STRP   insulin  lispro (HUMALOG ) 100 UNIT/ML KwikPen   Insulin  Pen Needle 32G X 4 MM MISC   Lancet Device MISC   Lancets (ONETOUCH DELICA PLUS LANCET33G) MISC   LANTUS  SOLOSTAR 100 UNIT/ML Solostar Pen   lisinopril  (PRINIVIL ,ZESTRIL ) 40 MG tablet   Netarsudil-Latanoprost  (ROCKLATAN)  0.02-0.005 % SOLN   omeprazole (PRILOSEC) 20 MG capsule   Probiotic Product (PROBIOTIC PO)   sertraline (ZOLOFT) 50 MG tablet   testosterone  cypionate (DEPOTESTOSTERONE CYPIONATE) 200 MG/ML injection   No current facility-administered medications for this encounter.    Isaiah Ruder, PA-C Surgical Short Stay/Anesthesiology [facility] Phone [phone] [facility] Phone [phone] 11/16/2023 3:34 PM

## 2023-11-16 NOTE — Anesthesia Preprocedure Evaluation (Signed)
 Anesthesia Evaluation  Patient identified by MRN, date of birth, ID band Patient awake    Reviewed: Allergy & Precautions, NPO status , Patient's Chart, lab work & pertinent test results  Airway Mallampati: II  TM Distance: >3 FB Neck ROM: Full    Dental  (+) Dental Advisory Given   Pulmonary neg pulmonary ROS   breath sounds clear to auscultation       Cardiovascular hypertension, Pt. on medications + dysrhythmias Atrial Fibrillation  Rhythm:Regular Rate:Normal     Neuro/Psych CVA, Residual Symptoms    GI/Hepatic negative GI ROS, Neg liver ROS,,,  Endo/Other  diabetes, Type 2, Insulin  Dependent    Renal/GU negative Renal ROS     Musculoskeletal   Abdominal   Peds  Hematology negative hematology ROS (+)   Anesthesia Other Findings   Reproductive/Obstetrics                              Anesthesia Physical Anesthesia Plan  ASA: 3  Anesthesia Plan: General   Post-op Pain Management: Tylenol  PO (pre-op)*   Induction: Intravenous  PONV Risk Score and Plan: 2 and Dexamethasone, Ondansetron  and Treatment may vary due to age or medical condition  Airway Management Planned: Oral ETT  Additional Equipment:   Intra-op Plan:   Post-operative Plan: Extubation in OR  Informed Consent: I have reviewed the patients History and Physical, chart, labs and discussed the procedure including the risks, benefits and alternatives for the proposed anesthesia with the patient or authorized representative who has indicated his/her understanding and acceptance.     Dental advisory given  Plan Discussed with: CRNA  Anesthesia Plan Comments: (  )         Anesthesia Quick Evaluation

## 2023-11-17 ENCOUNTER — Ambulatory Visit: Payer: Self-pay | Admitting: General Surgery

## 2023-11-17 NOTE — H&P (Signed)
 History of Present Illness: [name] is a 63 y.o. male who is seen today as an office consultation at the request of Dr. Lazoff for evaluation of Post Operative Visit .     History of Present Illness [name] is a 63 year old male with a history of stroke on Eliquis  who presents for evaluation of a hernia.   He has had a hernia for approximately six years, which has been gradually increasing in size without associated pain. Previous attempts to evaluate the hernia were hindered by scheduling conflicts and anticoagulation management issues.   He is on Eliquis , 5 mg twice daily, following a stroke. There was a previous incident of incorrect anticoagulation therapy, which has since been corrected.   His past surgical history includes a groin hernia repair. He is also diabetic.       Review of Systems: A complete review of systems was obtained from the patient.  I have reviewed this information and discussed as appropriate with the patient.  See HPI as well for other ROS.   Review of Systems  Constitutional:  Negative for fever.  HENT:  Negative for congestion.   Eyes:  Negative for blurred vision.  Respiratory:  Negative for cough, shortness of breath and wheezing.   Cardiovascular:  Negative for chest pain and palpitations.  Gastrointestinal:  Negative for heartburn.  Genitourinary:  Negative for dysuria.  Musculoskeletal:  Negative for myalgias.  Skin:  Negative for rash.  Neurological:  Negative for dizziness and headaches.  Psychiatric/Behavioral:  Negative for depression and suicidal ideas.   All other systems reviewed and are negative.       Medical History:     Past Medical History:  Diagnosis Date   Aneurysm ()     Arthritis     Diabetes mellitus without complication (CMS/HHS-HCC)     DVT (deep venous thrombosis) (CMS/HHS-HCC)     GERD (gastroesophageal reflux disease)     Glaucoma (increased eye pressure)     History of cancer     History of stroke      Hypertension        There is no problem list on file for this patient.     History reviewed. No pertinent surgical history.         Allergies  Allergen Reactions   Hydrocodone-Acetaminophen  Unknown, Nausea And Vomiting and Other (See Comments)      Makes patient sick            Current Outpatient Medications on File Prior to Visit  Medication Sig Dispense Refill   acetaminophen  (TYLENOL ) 650 MG ER tablet Take 650 mg by mouth every 8 (eight) hours as needed for Pain       aspirin  81 MG EC tablet Take 81 mg by mouth once daily       atorvastatin  (LIPITOR) 40 MG tablet Take 40 mg by mouth at bedtime       ELIQUIS  5 mg tablet Take 5 mg by mouth 2 (two) times daily       insulin  LISPRO (HUMALOG  KWIKPEN) pen injector (concentration 100 units/mL) CHECK BLOOD SUGARS BEFORE EACH MEAL AND FOR BLOOD SUGARS 150-200 INJECT 2 UNITS, 200-250 INJECT 4 UNITS, 250-300 INJECT 6 UNITS, 300-350 INJECT 8 UNITS, >350 INJECT 10 UNITS       LANTUS  SOLOSTAR U-100 INSULIN  pen injector (concentration 100 units/mL) Inject 24 Units subcutaneously       lisinopriL  (ZESTRIL ) 40 MG tablet         methocarbamoL (ROBAXIN)  500 MG tablet Take 500 mg by mouth       omeprazole (PRILOSEC OTC) 20 MG EC tablet 1 tablet 30 minutes before morning meal Orally Once a day; Duration: 30 day(s)       omeprazole (PRILOSEC) 20 MG DR capsule Take 20 mg by mouth once daily       sertraline (ZOLOFT) 50 MG tablet Take 50 mg by mouth once daily       XARELTO  20 mg tablet         dextroamphetamine-amphetamine (ADDERALL) 5 mg tablet TAKE 1 TABLET BY MOUTH THREE TIMES A DAY FOR 30 DAYS       glipiZIDE  (GLUCOTROL ) 5 MG tablet         tamsulosin  (FLOMAX ) 0.4 mg capsule         TRADJENTA  5 mg tablet         valACYclovir  (VALTREX ) 1000 MG tablet Take 1 tablet (1,000 mg total) by mouth once daily        No current facility-administered medications on file prior to visit.           Family History  Problem Relation Age of Onset   Skin  cancer Father     High blood pressure (Hypertension) Father     Hyperlipidemia (Elevated cholesterol) Father     Diabetes Father        Social History       Tobacco Use  Smoking Status Never  Smokeless Tobacco Never      Social History        Socioeconomic History   Marital status: Married  Tobacco Use   Smoking status: Never   Smokeless tobacco: Never  Substance and Sexual Activity   Alcohol  use: Yes   Drug use: Never    Social Drivers of Health        Food Insecurity: Low Risk  (06/03/2023)    Received from [facility]    Hunger Vital Sign     Within the past 12 months, you worried that your food would run out before you got money to buy more: Never true     Within the past 12 months, the food you bought just didn't last and you didn't have money to get more. : Never true  Transportation Needs: No Transportation Needs (06/03/2023)    Received from [employer]     In the past 12 months, has lack of reliable transportation kept you from medical appointments, meetings, work or from getting things needed for daily living? : No  Housing Stability: Unknown (09/09/2023)    Housing Stability Vital Sign     Homeless in the Last Year: No      Objective:         Vitals:    09/09/23 1351  PainSc: 0-No pain    There is no height or weight on file to calculate BMI.   Physical Exam Constitutional:      General: He is not in acute distress.    Appearance: Normal appearance.  HENT:     Head: Normocephalic.     Nose: No rhinorrhea.     Mouth/Throat:     Mouth: Mucous membranes are moist.     Pharynx: Oropharynx is clear.  Eyes:     General: No scleral icterus.    Pupils: Pupils are equal, round, and reactive to light.  Cardiovascular:     Rate and Rhythm: Normal rate.     Pulses: Normal pulses.  Pulmonary:  Effort: Pulmonary effort is normal. No respiratory distress.     Breath sounds: No stridor. No wheezing.  Abdominal:     General:  Abdomen is flat. There is no distension.     Tenderness: There is no abdominal tenderness. There is no guarding or rebound.     Hernia: A hernia is present. Hernia is present in the umbilical area.  Musculoskeletal:        General: Normal range of motion.     Cervical back: Normal range of motion and neck supple.  Skin:    General: Skin is warm and dry.     Capillary Refill: Capillary refill takes less than 2 seconds.     Coloration: Skin is not jaundiced.  Neurological:     General: No focal deficit present.     Mental Status: He is alert and oriented to person, place, and time. Mental status is at baseline.  Psychiatric:        Mood and Affect: Mood normal.        Thought Content: Thought content normal.        Judgment: Judgment normal.       Hernia Size:3cm Incarcerated: no Initial Hernia     Assessment and Plan:  Diagnoses and all orders for this visit:   Umbilical hernia without obstruction or gangrene     [name] is a 63 y.o. male    Needs eliquis  clkearance    We will proceed to the OR for a lap ventral hernia repair with mesh. All risks and benefits were discussed with the patient, to generally include infection, bleeding, damage to surrounding structures, acute and chronic nerve pain, and recurrence. Alternatives were offered and described.  All questions were answered and the patient voiced understanding of the procedure and wishes to proceed at this point.             No follow-ups on file.   Lynda Leos, MD, [facility], [place] [facility]

## 2023-11-18 ENCOUNTER — Ambulatory Visit (HOSPITAL_COMMUNITY)
Admission: RE | Admit: 2023-11-18 | Discharge: 2023-11-18 | Disposition: A | Discharge status: Home / Self Care | Attending: General Surgery | Admitting: General Surgery

## 2023-11-18 ENCOUNTER — Ambulatory Visit (HOSPITAL_COMMUNITY): Admitting: Vascular Surgery

## 2023-11-18 ENCOUNTER — Encounter (HOSPITAL_COMMUNITY)
Admission: RE | Disposition: A | Discharge status: Home / Self Care | Payer: Self-pay | Source: Home / Self Care | Attending: General Surgery

## 2023-11-18 ENCOUNTER — Encounter (HOSPITAL_COMMUNITY): Payer: Self-pay | Admitting: General Surgery

## 2023-11-18 ENCOUNTER — Ambulatory Visit (HOSPITAL_COMMUNITY): Admitting: Anesthesiology

## 2023-11-18 DIAGNOSIS — Z8673 Personal history of transient ischemic attack (TIA), and cerebral infarction without residual deficits: Secondary | ICD-10-CM | POA: Diagnosis not present

## 2023-11-18 DIAGNOSIS — Z01818 Encounter for other preprocedural examination: Secondary | ICD-10-CM

## 2023-11-18 DIAGNOSIS — K429 Umbilical hernia without obstruction or gangrene: Secondary | ICD-10-CM

## 2023-11-18 DIAGNOSIS — Z794 Long term (current) use of insulin: Secondary | ICD-10-CM | POA: Insufficient documentation

## 2023-11-18 DIAGNOSIS — Z7901 Long term (current) use of anticoagulants: Secondary | ICD-10-CM | POA: Insufficient documentation

## 2023-11-18 DIAGNOSIS — I4891 Unspecified atrial fibrillation: Secondary | ICD-10-CM

## 2023-11-18 DIAGNOSIS — I10 Essential (primary) hypertension: Secondary | ICD-10-CM

## 2023-11-18 DIAGNOSIS — E119 Type 2 diabetes mellitus without complications: Secondary | ICD-10-CM | POA: Insufficient documentation

## 2023-11-18 HISTORY — PX: UMBILICAL HERNIA REPAIR: SHX196

## 2023-11-18 LAB — GLUCOSE, CAPILLARY
Glucose-Capillary: 228 mg/dL — ABNORMAL HIGH (ref 70–99)
Glucose-Capillary: 196 mg/dL — ABNORMAL HIGH (ref 70–99)

## 2023-11-18 SURGERY — REPAIR, HERNIA, UMBILICAL, LAPAROSCOPIC
Anesthesia: General

## 2023-11-18 MED ORDER — LIDOCAINE 2% (20 MG/ML) 5 ML SYRINGE
INTRAMUSCULAR | Status: DC | PRN
  Administered 2023-11-18: 6 mg via INTRAVENOUS

## 2023-11-18 MED ORDER — DEXAMETHASONE SOD PHOSPHATE PF 10 MG/ML IJ SOLN
INTRAMUSCULAR | Status: DC | PRN
  Administered 2023-11-18: 5 mg via INTRAVENOUS

## 2023-11-18 MED ORDER — CEFAZOLIN SODIUM-DEXTROSE 2-4 GM/100ML-% IV SOLN
2.0000 g | INTRAVENOUS | Status: AC
Start: 2023-11-18 — End: 2023-11-18
  Administered 2023-11-18: 2 g via INTRAVENOUS
  Filled 2023-11-18: qty 100

## 2023-11-18 MED ORDER — FENTANYL CITRATE (PF) 100 MCG/2ML IJ SOLN
INTRAMUSCULAR | Status: AC
  Filled 2023-11-18: qty 2

## 2023-11-18 MED ORDER — LACTATED RINGERS IV SOLN: INTRAVENOUS | Status: DC

## 2023-11-18 MED ORDER — INSULIN ASPART 100 UNIT/ML IJ SOLN
INTRAMUSCULAR | Status: AC
  Filled 2023-11-18: qty 1

## 2023-11-18 MED ORDER — EPHEDRINE 5 MG/ML INJ
INTRAVENOUS | Status: AC
  Filled 2023-11-18: qty 5

## 2023-11-18 MED ORDER — ONDANSETRON HCL 4 MG/2ML IJ SOLN
INTRAMUSCULAR | Status: DC | PRN
  Administered 2023-11-18: 4 mg via INTRAVENOUS

## 2023-11-18 MED ORDER — HYDROMORPHONE HCL 1 MG/ML IJ SOLN
0.2500 mg | INTRAMUSCULAR | Status: DC | PRN
  Administered 2023-11-18: 0.25 mg via INTRAVENOUS

## 2023-11-18 MED ORDER — SUGAMMADEX SODIUM 200 MG/2ML IV SOLN
INTRAVENOUS | Status: DC | PRN
  Administered 2023-11-18: 400 mg via INTRAVENOUS

## 2023-11-18 MED ORDER — AMISULPRIDE (ANTIEMETIC) 5 MG/2ML IV SOLN: 10.0000 mg | Freq: Once | INTRAVENOUS | Status: DC | PRN

## 2023-11-18 MED ORDER — LIDOCAINE 2% (20 MG/ML) 5 ML SYRINGE
INTRAMUSCULAR | Status: AC
  Filled 2023-11-18: qty 5

## 2023-11-18 MED ORDER — BUPIVACAINE HCL 0.25 % IJ SOLN
INTRAMUSCULAR | Status: DC | PRN
  Administered 2023-11-18: 8 mL

## 2023-11-18 MED ORDER — FENTANYL CITRATE (PF) 250 MCG/5ML IJ SOLN
INTRAMUSCULAR | Status: DC | PRN
  Administered 2023-11-18 (×2): 50 ug via INTRAVENOUS
  Administered 2023-11-18: 100 ug via INTRAVENOUS

## 2023-11-18 MED ORDER — 0.9 % SODIUM CHLORIDE (POUR BTL) OPTIME
TOPICAL | Status: DC | PRN
  Administered 2023-11-18: 1000 mL

## 2023-11-18 MED ORDER — PROPOFOL 10 MG/ML IV BOLUS
INTRAVENOUS | Status: DC | PRN
  Administered 2023-11-18: 120 mg via INTRAVENOUS

## 2023-11-18 MED ORDER — CHLORHEXIDINE GLUCONATE CLOTH 2 % EX PADS: 6.0000 | MEDICATED_PAD | Freq: Once | CUTANEOUS | Status: DC

## 2023-11-18 MED ORDER — CHLORHEXIDINE GLUCONATE 0.12 % MT SOLN
15.0000 mL | Freq: Once | OROMUCOSAL | Status: AC
  Administered 2023-11-18: 15 mL via OROMUCOSAL
  Filled 2023-11-18: qty 15

## 2023-11-18 MED ORDER — BUPIVACAINE HCL (PF) 0.25 % IJ SOLN
INTRAMUSCULAR | Status: AC
  Filled 2023-11-18: qty 30

## 2023-11-18 MED ORDER — ROCURONIUM BROMIDE 10 MG/ML (PF) SYRINGE
PREFILLED_SYRINGE | INTRAVENOUS | Status: AC
  Filled 2023-11-18: qty 10

## 2023-11-18 MED ORDER — FENTANYL CITRATE (PF) 100 MCG/2ML IJ SOLN
25.0000 ug | INTRAMUSCULAR | Status: DC | PRN
  Administered 2023-11-18 (×3): 50 ug via INTRAVENOUS

## 2023-11-18 MED ORDER — ACETAMINOPHEN 500 MG PO TABS
1000.0000 mg | ORAL_TABLET | ORAL | Status: AC
Start: 2023-11-18 — End: 2023-11-18
  Administered 2023-11-18: 1000 mg via ORAL
  Filled 2023-11-18: qty 2

## 2023-11-18 MED ORDER — HYDROMORPHONE HCL 1 MG/ML IJ SOLN
INTRAMUSCULAR | Status: AC
  Filled 2023-11-18: qty 1

## 2023-11-18 MED ORDER — TRAMADOL HCL 50 MG PO TABS: 50.0000 mg | ORAL_TABLET | Freq: Four times a day (QID) | ORAL | 0 refills | Status: DC | PRN

## 2023-11-18 MED ORDER — INSULIN ASPART 100 UNIT/ML IJ SOLN: 0.0000 [IU] | INTRAMUSCULAR | Status: DC | PRN

## 2023-11-18 MED ORDER — MIDAZOLAM HCL 2 MG/2ML IJ SOLN
INTRAMUSCULAR | Status: AC
  Filled 2023-11-18: qty 2

## 2023-11-18 MED ORDER — PROPOFOL 10 MG/ML IV BOLUS
INTRAVENOUS | Status: AC
  Filled 2023-11-18: qty 20

## 2023-11-18 MED ORDER — PHENYLEPHRINE 80 MCG/ML (10ML) SYRINGE FOR IV PUSH (FOR BLOOD PRESSURE SUPPORT)
PREFILLED_SYRINGE | INTRAVENOUS | Status: AC
  Filled 2023-11-18: qty 10

## 2023-11-18 MED ORDER — ROCURONIUM BROMIDE 10 MG/ML (PF) SYRINGE
PREFILLED_SYRINGE | INTRAVENOUS | Status: DC | PRN
  Administered 2023-11-18: 60 mg via INTRAVENOUS
  Administered 2023-11-18: 20 mg via INTRAVENOUS

## 2023-11-18 MED ORDER — MIDAZOLAM HCL 2 MG/2ML IJ SOLN
INTRAMUSCULAR | Status: DC | PRN
  Administered 2023-11-18: 2 mg via INTRAVENOUS

## 2023-11-18 MED ORDER — ONDANSETRON HCL 4 MG/2ML IJ SOLN
INTRAMUSCULAR | Status: AC
  Filled 2023-11-18: qty 2

## 2023-11-18 MED ORDER — ORAL CARE MOUTH RINSE: 15.0000 mL | Freq: Once | OROMUCOSAL | Status: AC

## 2023-11-18 SURGICAL SUPPLY — 42 items
BAG COUNTER SPONGE SURGICOUNT (BAG) ×2 IMPLANT
BLADE CLIPPER SURG (BLADE) IMPLANT
CANISTER SUCTION 3000ML PPV (SUCTIONS) IMPLANT
CHLORAPREP W/TINT 26 (MISCELLANEOUS) ×2 IMPLANT
COVER SURGICAL LIGHT HANDLE (MISCELLANEOUS) ×2 IMPLANT
DERMABOND ADVANCED .7 DNX12 (GAUZE/BANDAGES/DRESSINGS) ×2 IMPLANT
DEVICE SECURE STRAP 25 ABSORB (INSTRUMENTS) ×2 IMPLANT
DEVICE TROCAR PUNCTURE CLOSURE (ENDOMECHANICALS) ×2 IMPLANT
ELECTRODE REM PT RTRN 9FT ADLT (ELECTROSURGICAL) ×2 IMPLANT
GLOVE BIO SURGEON STRL SZ7.5 (GLOVE) ×4 IMPLANT
GOWN STRL REUS W/ TWL LRG LVL3 (GOWN DISPOSABLE) ×4 IMPLANT
GOWN STRL REUS W/ TWL XL LVL3 (GOWN DISPOSABLE) ×2 IMPLANT
IRRIGATION SUCT STRKRFLW 2 WTP (MISCELLANEOUS) IMPLANT
KIT BASIN OR (CUSTOM PROCEDURE TRAY) ×2 IMPLANT
KIT TURNOVER KIT B (KITS) ×2 IMPLANT
MARKER SKIN DUAL TIP RULER LAB (MISCELLANEOUS) ×2 IMPLANT
MESH VENTRALIGHT ST 4X6IN (Mesh General) IMPLANT
NDL INSUFFLATION 14GA 120MM (NEEDLE) ×2 IMPLANT
NDL SPNL 22GX3.5 QUINCKE BK (NEEDLE) IMPLANT
NEEDLE INSUFFLATION 14GA 120MM (NEEDLE) ×1 IMPLANT
NEEDLE SPNL 22GX3.5 QUINCKE BK (NEEDLE) IMPLANT
PAD ARMBOARD POSITIONER FOAM (MISCELLANEOUS) ×4 IMPLANT
SCISSORS LAP 5X35 DISP (ENDOMECHANICALS) ×2 IMPLANT
SET TUBE SMOKE EVAC HIGH FLOW (TUBING) ×2 IMPLANT
SLEEVE Z-THREAD 5X100MM (TROCAR) ×2 IMPLANT
SOLN 0.9% NACL 1000 ML (IV SOLUTION) ×1 IMPLANT
SOLN 0.9% NACL POUR BTL 1000ML (IV SOLUTION) ×2 IMPLANT
SOLN STERILE WATER 1000 ML (IV SOLUTION) ×1 IMPLANT
SOLN STERILE WATER BTL 1000 ML (IV SOLUTION) ×2 IMPLANT
SUT CHROMIC 2 0 SH (SUTURE) ×2 IMPLANT
SUT ETHIBOND 0 MO6 C/R (SUTURE) IMPLANT
SUT MNCRL AB 4-0 PS2 18 (SUTURE) ×2 IMPLANT
SUT NOVA 1 T20/GS 25DT (SUTURE) IMPLANT
SUT PROLENE 2 0 KS (SUTURE) ×4 IMPLANT
SUT VICRYL 0 UR6 27IN ABS (SUTURE) IMPLANT
TOWEL GREEN STERILE (TOWEL DISPOSABLE) ×2 IMPLANT
TOWEL GREEN STERILE FF (TOWEL DISPOSABLE) ×2 IMPLANT
TRAY LAPAROSCOPIC MC (CUSTOM PROCEDURE TRAY) ×2 IMPLANT
TROCAR 11X100 Z THREAD (TROCAR) IMPLANT
TROCAR BALLN 12MMX100 BLUNT (TROCAR) IMPLANT
TROCAR Z-THREAD OPTICAL 5X100M (TROCAR) ×2 IMPLANT
WARMER LAPAROSCOPE (MISCELLANEOUS) ×2 IMPLANT

## 2023-11-18 NOTE — Transfer of Care (Signed)
 Immediate Anesthesia Transfer of Care Note  Patient: [name]  Procedure(s) Performed: REPAIR, HERNIA, UMBILICAL, LAPAROSCOPIC  Patient Location: PACU  Anesthesia Type:General  Level of Consciousness: awake, alert , and oriented  Airway & Oxygen Therapy: Patient Spontanous Breathing and Patient connected to face mask oxygen  Post-op Assessment: Report given to RN and Post -op Vital signs reviewed and stable  Post vital signs: Reviewed and stable  Last Vitals:  Vitals Value Taken Time  BP 194/131 11/18/23 08:40  Temp 36.7 C 11/18/23 08:40  Pulse 71 11/18/23 08:44  Resp 21 11/18/23 08:44  SpO2 98 % 11/18/23 08:44  Vitals shown include unfiled device data.  Last Pain:  Vitals:   11/18/23 0840  TempSrc:   PainSc: Asleep      Patients Stated Pain Goal: 4 (11/18/23 9392)  Complications: No notable events documented.

## 2023-11-18 NOTE — Op Note (Signed)
 11/18/2023  8:17 AM  PATIENT:  [name]  63 y.o. male  PRE-OPERATIVE DIAGNOSIS:  UMBILICAL HERNIA  POST-OPERATIVE DIAGNOSIS:  UMBILICAL HERNIA 2cm  PROCEDURE:  Procedure(s): REPAIR, HERNIA, UMBILICAL, LAPAROSCOPIC (N/A)  SURGEON:  Surgeons and Role:    DEWAINE Rubin Calamity, MD - Primary  ASSISTANTS: Burnard Conner, RNFA  ANESTHESIA:   local and general  EBL:  minimal   BLOOD ADMINISTERED:none  DRAINS: none   LOCAL MEDICATIONS USED:  MARCAINE     SPECIMEN:  No Specimen  DISPOSITION OF SPECIMEN:  N/A  COUNTS:  YES  TOURNIQUET:  * No tourniquets in log *  DICTATION: .Dragon Dictation Findings: 2cm UH  Details of the procedure:  After the patient was consented patient was taken back to the operating room patient was then placed in supine position bilateral SCDs in place.  The patient was prepped and draped in the usual sterile fashion. After antibiotics were confirmed a timeout was called and all facts were verified. The Veress needle technique was used to insuflate the abdomen at Palmer's point. The abdomen was insufflated to 14 mm mercury. Subsequently a 5 mm trocar was placed a camera inserted there was no injury to any intra-abdominal organs.    There was seen to be an non-incarcerated  2cm umbilical hernia.  A second camera port was in placed into the left lower quadrant.   At this the Falicform ligament was taken down with Bovie cautery maintaining hemostasis.  I proceeded to reduce the hernia contents.  The hernia sac was dissected out of the hernia and disposed.  The fascia at the hernia was reapproximated using a #0 Ethibonds x 3.  Once the hernia was cleared away, a Bard Ventralight 10x15cm  mesh was inserted into the abdomen.  The mesh was secured circumferentially with am Securestrap tacker in a double crown fashion.  The omentum was brought over the area of the mesh. The fascia at the left lower quadrant port site was reapproximated with a 0 vicryl and an  endoclose device.  The pneumoperitoneum was evacuated & all trocars  were removed. The skin was reapproximated with 4-0  Monocryl sutures in a subcuticular fashion. The skin was dressed with Dermabond.  The patient was taken to the recovery room in stable condition.  Type of repair -primary suture & mesh  Mesh overlap - 4cm  Placement of mesh -  beneath fascia and into peritoneal cavity  Size: 3cm, Primary Hernia, and Reducible Hernia    PLAN OF CARE: Discharge to home after PACU  PATIENT DISPOSITION:  PACU - hemodynamically stable.   Delay start of Pharmacological VTE agent (>24hrs) due to surgical blood loss or risk of bleeding: not applicable

## 2023-11-18 NOTE — Anesthesia Postprocedure Evaluation (Signed)
 Anesthesia Post Note  Patient: [name]  Procedure(s) Performed: REPAIR, HERNIA, UMBILICAL, LAPAROSCOPIC     Patient location during evaluation: PACU Anesthesia Type: General Level of consciousness: awake and alert Pain management: pain level controlled Vital Signs Assessment: post-procedure vital signs reviewed and stable Respiratory status: spontaneous breathing, nonlabored ventilation, respiratory function stable and patient connected to nasal cannula oxygen Cardiovascular status: blood pressure returned to baseline and stable Postop Assessment: no apparent nausea or vomiting Anesthetic complications: no   No notable events documented.  Last Vitals:  Vitals:   11/18/23 0945 11/18/23 1000  BP: (!) 171/101 (!) 176/97  Pulse: 78 80  Resp: 13 18  Temp:  36.6 C  SpO2: 93% 92%    Last Pain:  Vitals:   11/18/23 1000  TempSrc:   PainSc: 6                  Epifanio Lamar BRAVO

## 2023-11-18 NOTE — Interval H&P Note (Signed)
 History and Physical Interval Note:  11/18/2023 7:32 AM  [name]  has presented today for surgery, with the diagnosis of UMBILICAL HERNIA.  The various methods of treatment have been discussed with the patient and family. After consideration of risks, benefits and other options for treatment, the patient has consented to  Procedure(s): REPAIR, HERNIA, UMBILICAL, LAPAROSCOPIC (N/A) as a surgical intervention.  The patient's history has been reviewed, patient examined, no change in status, stable for surgery.  I have reviewed the patient's chart and labs.  Questions were answered to the patient's satisfaction.     Laynee Lockamy

## 2023-11-18 NOTE — Anesthesia Procedure Notes (Signed)
 Procedure Name: Intubation Date/Time: 11/18/2023 7:48 AM  Performed by: Elby Raelene SAUNDERS, CRNAPre-anesthesia Checklist: Patient identified, Emergency Drugs available, Suction available and Patient being monitored Patient Re-evaluated:Patient Re-evaluated prior to induction Oxygen Delivery Method: Circle System Utilized Preoxygenation: Pre-oxygenation with 100% oxygen Induction Type: IV induction Ventilation: Mask ventilation without difficulty Laryngoscope Size: Miller and 2 Grade View: Grade II Tube type: Oral Tube size: 7.5 mm Number of attempts: 1 Airway Equipment and Method: Stylet, Oral airway and Bite block Placement Confirmation: ETT inserted through vocal cords under direct vision, positive ETCO2 and breath sounds checked- equal and bilateral Secured at: 23 cm Tube secured with: Tape Dental Injury: Teeth and Oropharynx as per pre-operative assessment

## 2023-11-18 NOTE — Discharge Instructions (Signed)

## 2023-11-19 ENCOUNTER — Encounter (HOSPITAL_COMMUNITY): Payer: Self-pay | Admitting: General Surgery

## 2023-11-22 ENCOUNTER — Emergency Department (HOSPITAL_COMMUNITY)

## 2023-11-22 ENCOUNTER — Other Ambulatory Visit: Payer: Self-pay

## 2023-11-22 ENCOUNTER — Emergency Department (HOSPITAL_COMMUNITY)
Admission: EM | Admit: 2023-11-22 | Discharge: 2023-11-29 | Disposition: A | Discharge status: Skilled Nursing Facility | Attending: Emergency Medicine | Admitting: Emergency Medicine

## 2023-11-22 DIAGNOSIS — M79605 Pain in left leg: Secondary | ICD-10-CM | POA: Insufficient documentation

## 2023-11-22 DIAGNOSIS — R531 Weakness: Secondary | ICD-10-CM | POA: Diagnosis not present

## 2023-11-22 DIAGNOSIS — Z8673 Personal history of transient ischemic attack (TIA), and cerebral infarction without residual deficits: Secondary | ICD-10-CM | POA: Insufficient documentation

## 2023-11-22 LAB — URINALYSIS, ROUTINE W REFLEX MICROSCOPIC
Bacteria, UA: NONE SEEN
Bilirubin Urine: NEGATIVE
Glucose, UA: >=500 mg/dL — AB
Hgb urine dipstick: NEGATIVE
Ketones, ur: NEGATIVE mg/dL
Leukocytes,Ua: NEGATIVE
Nitrite: NEGATIVE
Protein, ur: 30 mg/dL — AB
Specific Gravity, Urine: 1.026 (ref 1.005–1.030)
pH: 5 (ref 5.0–8.0)

## 2023-11-22 LAB — CBC WITH DIFFERENTIAL/PLATELET
Abs Immature Granulocytes: 0.05 K/uL (ref 0.00–0.07)
Basophils Absolute: 0 K/uL (ref 0.0–0.1)
Basophils Relative: 0 %
Eosinophils Absolute: 0.1 K/uL (ref 0.0–0.5)
Eosinophils Relative: 1 %
HCT: 51.1 % (ref 39.0–52.0)
Hemoglobin: 17 g/dL (ref 13.0–17.0)
Immature Granulocytes: 0 %
Lymphocytes Relative: 13 %
Lymphs Abs: 1.5 K/uL (ref 0.7–4.0)
MCH: 30.6 pg (ref 26.0–34.0)
MCHC: 33.3 g/dL (ref 30.0–36.0)
MCV: 92.1 fL (ref 80.0–100.0)
Monocytes Absolute: 0.7 K/uL (ref 0.1–1.0)
Monocytes Relative: 6 %
Neutro Abs: 9 K/uL — ABNORMAL HIGH (ref 1.7–7.7)
Neutrophils Relative %: 80 %
Platelets: 218 K/uL (ref 150–400)
RBC: 5.55 MIL/uL (ref 4.22–5.81)
RDW: 13.7 % (ref 11.5–15.5)
WBC: 11.2 K/uL — ABNORMAL HIGH (ref 4.0–10.5)
nRBC: 0 % (ref 0.0–0.2)

## 2023-11-22 LAB — LIPASE, BLOOD: Lipase: 29 U/L (ref 11–51)

## 2023-11-22 LAB — COMPREHENSIVE METABOLIC PANEL WITH GFR
ALT: 22 U/L (ref 0–44)
AST: 23 U/L (ref 15–41)
Albumin: 4 g/dL (ref 3.5–5.0)
Alkaline Phosphatase: 50 U/L (ref 38–126)
Anion gap: 11 (ref 5–15)
BUN: 15 mg/dL (ref 8–23)
CO2: 23 mmol/L (ref 22–32)
Calcium: 9.7 mg/dL (ref 8.9–10.3)
Chloride: 99 mmol/L (ref 98–111)
Creatinine, Ser: 0.82 mg/dL (ref 0.61–1.24)
GFR, Estimated: >60 mL/min (ref >60)
Glucose, Bld: 147 mg/dL — ABNORMAL HIGH (ref 70–99)
Potassium: 4.4 mmol/L (ref 3.5–5.1)
Sodium: 133 mmol/L — ABNORMAL LOW (ref 135–145)
Total Bilirubin: 1.7 mg/dL — ABNORMAL HIGH (ref 0.0–1.2)
Total Protein: 7.6 g/dL (ref 6.5–8.1)

## 2023-11-22 LAB — RESP PANEL BY RT-PCR (RSV, FLU A&B, COVID)  RVPGX2
Influenza A by PCR: NEGATIVE
Influenza B by PCR: NEGATIVE
Resp Syncytial Virus by PCR: NEGATIVE
SARS Coronavirus 2 by RT PCR: NEGATIVE

## 2023-11-22 LAB — I-STAT CG4 LACTIC ACID, ED: Lactic Acid, Venous: 1.7 mmol/L (ref 0.5–1.9)

## 2023-11-22 NOTE — ED Triage Notes (Addendum)
 Patient arrives via [facility] for weakness after UTI x few weeks. Patient endorses finishing course of abx. Patient endorses weakness has not gotten better. Patient slid out of chair today and needed help getting up 1000 today. Patient has been off blood thinner for a week. GCS 15. Bruising noted on abdomen but per provider patient was told this was normal after procedure. Patient noted to have intermittent slurring, patient endorses this is normal since stroke in march.   EMS vitals BP 152 palp HR 88 RR 16 98 on room air

## 2023-11-22 NOTE — ED Provider Triage Note (Signed)
 Emergency Medicine Provider Triage Evaluation Note  NEVIN GRIZZLE , a 63 y.o. male  was evaluated in triage.  Pt complains of weakness.  Patient complains about diffuse weakness of both upper and lower extremities.  No focal neurodeficits I can appreciate.  Does endorses weakness very consistent with his previous UTI.  Patient does endorse a recent hernia surgery.  Has had no nausea or vomiting or diarrhea.  Bowel moods been normal.  Denies any new abdominal pain.  No back pain.  No difficulty with urination.  No bowel or bladder incontinence.  No saddle anesthesia..  Review of Systems  Positive: weakness Negative: CP/SOB/Abd pain  Physical Exam  BP (!) 164/102 (BP Location: Left Arm)   Pulse 100   Temp 99.5 F (37.5 C)   Resp 18   Ht 6' 1 (1.854 m)   Wt 100.2 kg   SpO2 96%   BMI 29.16 kg/m  Gen:   Awake, no distress   Resp:  Normal effort  MSK:   Moves extremities without difficulty  Other:    Medical Decision Making  Medically screening exam initiated at 4:55 PM.  Appropriate orders placed.  DATHAN ATTIA was informed that the remainder of the evaluation will be completed by another provider, this initial triage assessment does not replace that evaluation, and the importance of remaining in the ED until their evaluation is complete.  Obtain screening laboratory workup.  Obtain chest x-ray as well.  Sending off UA   Simon Lavonia SAILOR, MD 11/22/23 [phone]

## 2023-11-23 ENCOUNTER — Emergency Department (HOSPITAL_COMMUNITY)

## 2023-11-23 LAB — CBG MONITORING, ED
Glucose-Capillary: 213 mg/dL — ABNORMAL HIGH (ref 70–99)
Glucose-Capillary: 200 mg/dL — ABNORMAL HIGH (ref 70–99)

## 2023-11-23 MED ORDER — ASPIRIN 81 MG PO TBEC
81.0000 mg | DELAYED_RELEASE_TABLET | Freq: Every day | ORAL | Status: DC
  Administered 2023-11-23 – 2023-11-29 (×7): 81 mg via ORAL
  Filled 2023-11-23 (×7): qty 1

## 2023-11-23 MED ORDER — LISINOPRIL 20 MG PO TABS
40.0000 mg | ORAL_TABLET | Freq: Every day | ORAL | Status: DC
  Administered 2023-11-23 – 2023-11-28 (×6): 40 mg via ORAL
  Filled 2023-11-23 (×4): qty 2
  Filled 2023-11-23: qty 4
  Filled 2023-11-23 (×2): qty 2

## 2023-11-23 MED ORDER — ACETAMINOPHEN 500 MG PO TABS
1000.0000 mg | ORAL_TABLET | Freq: Once | ORAL | Status: AC
  Administered 2023-11-23: 1000 mg via ORAL
  Filled 2023-11-23: qty 2

## 2023-11-23 MED ORDER — ATORVASTATIN CALCIUM 40 MG PO TABS
40.0000 mg | ORAL_TABLET | Freq: Every day | ORAL | Status: DC
  Administered 2023-11-23 – 2023-11-29 (×7): 40 mg via ORAL
  Filled 2023-11-23 (×7): qty 1

## 2023-11-23 MED ORDER — INSULIN ASPART 100 UNIT/ML IJ SOLN
0.0000 [IU] | Freq: Three times a day (TID) | INTRAMUSCULAR | Status: DC
  Administered 2023-11-23 – 2023-11-24 (×2): 3 [IU] via SUBCUTANEOUS
  Administered 2023-11-24: 2 [IU] via SUBCUTANEOUS
  Administered 2023-11-24 – 2023-11-25 (×2): 7 [IU] via SUBCUTANEOUS
  Administered 2023-11-25: 2 [IU] via SUBCUTANEOUS
  Administered 2023-11-25: 7 [IU] via SUBCUTANEOUS
  Administered 2023-11-26 (×3): 2 [IU] via SUBCUTANEOUS
  Administered 2023-11-27 (×2): 3 [IU] via SUBCUTANEOUS
  Administered 2023-11-27 – 2023-11-29 (×5): 2 [IU] via SUBCUTANEOUS

## 2023-11-23 MED ORDER — APIXABAN 5 MG PO TABS
5.0000 mg | ORAL_TABLET | Freq: Two times a day (BID) | ORAL | Status: DC
  Administered 2023-11-23 – 2023-11-29 (×12): 5 mg via ORAL
  Filled 2023-11-23 (×12): qty 1

## 2023-11-23 MED ORDER — DORZOLAMIDE HCL-TIMOLOL MAL 2-0.5 % OP SOLN
1.0000 [drp] | Freq: Two times a day (BID) | OPHTHALMIC | Status: DC
  Administered 2023-11-23 – 2023-11-28 (×10): 1 [drp] via OPHTHALMIC
  Filled 2023-11-23 (×3): qty 10

## 2023-11-23 MED ORDER — LACTATED RINGERS IV BOLUS
1000.0000 mL | Freq: Once | INTRAVENOUS | Status: AC
  Administered 2023-11-23: 1000 mL via INTRAVENOUS

## 2023-11-23 MED ORDER — OXYCODONE HCL 5 MG PO TABS
5.0000 mg | ORAL_TABLET | Freq: Once | ORAL | Status: AC
  Administered 2023-11-23: 5 mg via ORAL
  Filled 2023-11-23: qty 1

## 2023-11-23 MED ORDER — SERTRALINE HCL 50 MG PO TABS
50.0000 mg | ORAL_TABLET | Freq: Every day | ORAL | Status: DC
  Administered 2023-11-23 – 2023-11-29 (×7): 50 mg via ORAL
  Filled 2023-11-23 (×7): qty 1

## 2023-11-23 MED ORDER — INSULIN ASPART 100 UNIT/ML IJ SOLN
0.0000 [IU] | Freq: Every day | INTRAMUSCULAR | Status: DC
  Administered 2023-11-24: 2 [IU] via SUBCUTANEOUS

## 2023-11-23 NOTE — ED Notes (Signed)
 IV team consult ordered , multiple unsuccessful venipuncture attempts.

## 2023-11-23 NOTE — ED Notes (Signed)
IV team RN at bedside.  

## 2023-11-23 NOTE — ED Notes (Signed)
 Patient transported to X-ray

## 2023-11-23 NOTE — TOC Initial Note (Signed)
 Transition of Care [facility]) - Initial/Assessment Note    Patient Details  Name: [name] MRN: [id number] Date of Birth: 1960/02/22  [facility] CM/SW Contact:    Hartley KATHEE Robertson, LCSWA Phone Number: 11/23/2023, 9:01 PM  Clinical Narrative:                  CSW spoke with pt via phone, pt states he originally was interested in [facility] services but when CSW clarified that home PT was only for one to two times a week, he stated he may see more benefit in STR at SNF, CSW explained SNF process, explained pt would need to be evaluated by PT first that this would occur tomorrow. Pt states he is from home alone but has support from friends that stop by and check on him. TOC will continue to follow, CSW requested MD place order for PT to evaluate pt.         Patient Goals and CMS Choice            Expected Discharge Plan and Services                                              Prior Living Arrangements/Services                       Activities of Daily Living      Permission Sought/Granted                  Emotional Assessment              Admission diagnosis:  laceration to rt leg Patient Active Problem List   Diagnosis Date Noted   UTI (urinary tract infection) 10/08/2023   Weakness 10/08/2023   Dysarthria 10/08/2023   Acute ischemic stroke (HCC) 04/09/2023   History of DVT (deep vein thrombosis) 04/09/2023   Primary hypertension 04/09/2023   Basilar artery stenosis 04/07/2023   Diabetes mellitus (HCC) 09/05/2019   Acute lower UTI 09/05/2019   Ataxia 09/04/2019   Acute cystitis with hematuria    Hyperlipidemia 08/21/2014   Chest pain 08/21/2014   Bronchitis 05/08/2014   Candida rash of groin 05/08/2014   Attention deficit hyperactivity disorder (ADHD) 03/04/2014   Depression 03/04/2014   Erectile dysfunction 03/04/2014   Tinea 03/04/2014   Hypotestosteronism 01/20/2013   HTN (hypertension), benign 08/31/2011    Polycythemia, secondary 08/31/2011   Abnormal liver function test 08/31/2011   Burkitt's lymphoma (HCC) 02/23/2011   PCP:  Macarthur Elouise SQUIBB, DO Pharmacy:   [facility] [phone] - [place],  - [address] [place] [address] Phone: [phone] Fax: [phone]  [facility] [address] [place] [address] Phone: [phone] Fax: [phone]     Social Drivers of Health (SDOH) Social History: SDOH Screenings   Food Insecurity: Low Risk  (10/21/2023)   Received from [facility]  Housing: Low Risk  (10/21/2023)   Received from [facility]  Transportation Needs: No Transportation Needs (10/21/2023)   Received from [facility]  Utilities: Low Risk  (10/21/2023)   Received from [facility]  Tobacco Use: Low Risk  (11/18/2023)   SDOH Interventions:     Readmission Risk Interventions     No data to display

## 2023-11-23 NOTE — Discharge Instructions (Addendum)
 Continue your medications as prescribed. You will complete rehab at [facility].

## 2023-11-23 NOTE — ED Notes (Addendum)
 Attending contacted for pain medication and pain relief cream for left knee per patient request.

## 2023-11-23 NOTE — ED Provider Notes (Signed)
 [place] EMERGENCY DEPARTMENT AT [facility] Provider Note   CSN: [id number] Arrival date & time: 11/22/23  1604     History Chief Complaint  Patient presents with   Weakness    HPI [name] is a 63 y.o. male presenting for chief complaint of diffuse weakness.  He is a 63 year old male with a history of CVA.  He had a recent hernia repair.  He has substantial abdominal bruising but otherwise states the pain has recovered and he has been feeling well.  However over the last 3 weeks has had progressively worsening lower extremity weakness bilaterally.  History of CVA but states that was mostly localized to just his left lower extremity.  He has had 4 falls over the past few weeks with a fall earlier today onto his left knee.  Has difficulty getting up without family coming and helping him.  Lives at home alone.  Family visits occasionally. Notably patient states he does not want referral to skilled nursing facility, states that he would refuse that level care at this time.  States he just wants to evaluate for any new acute pathologies.  He was told by his PCP that it may be a UTI and finished a course of antibiotics without any improvement..   Patient's recorded medical, surgical, social, medication list and allergies were reviewed in the Snapshot window as part of the initial history.   Review of Systems   Review of Systems  Constitutional:  Positive for fatigue. Negative for chills and fever.  HENT:  Negative for ear pain and sore throat.   Eyes:  Negative for pain and visual disturbance.  Respiratory:  Negative for cough and shortness of breath.   Cardiovascular:  Negative for chest pain and palpitations.  Gastrointestinal:  Negative for abdominal pain and vomiting.  Genitourinary:  Negative for dysuria and hematuria.  Musculoskeletal:  Negative for arthralgias and back pain.  Skin:  Negative for color change and rash.  Neurological:  Positive for weakness.  Negative for seizures and syncope.  All other systems reviewed and are negative.   Physical Exam Updated Vital Signs BP (!) 146/95 (BP Location: Right Arm)   Pulse 82   Temp 97.7 F (36.5 C) (Oral)   Resp 18   Ht 6' 1 (1.854 m)   Wt 100.2 kg   SpO2 93%   BMI 29.16 kg/m  Physical Exam Vitals and nursing note reviewed.  Constitutional:      General: He is not in acute distress.    Appearance: He is well-developed.  HENT:     Head: Normocephalic and atraumatic.  Eyes:     Conjunctiva/sclera: Conjunctivae normal.  Cardiovascular:     Rate and Rhythm: Normal rate and regular rhythm.     Heart sounds: No murmur heard. Pulmonary:     Effort: Pulmonary effort is normal. No respiratory distress.     Breath sounds: Normal breath sounds.  Abdominal:     Palpations: Abdomen is soft.     Tenderness: There is no abdominal tenderness.  Musculoskeletal:        General: Tenderness (TTP left leg.) present. No swelling.     Cervical back: Neck supple.  Skin:    General: Skin is warm and dry.     Capillary Refill: Capillary refill takes less than 2 seconds.  Neurological:     Mental Status: He is alert.  Psychiatric:        Mood and Affect: Mood normal.  ED Course/ Medical Decision Making/ A&P    Procedures Procedures   Medications Ordered in ED Medications  lactated ringers  bolus 1,000 mL (0 mLs Intravenous Stopped 11/23/23 0510)  acetaminophen  (TYLENOL ) tablet 1,000 mg (1,000 mg Oral Given 11/23/23 0534)  oxyCODONE (Oxy IR/ROXICODONE) immediate release tablet 5 mg (5 mg Oral Given 11/23/23 0534)   Medical Decision Making:   [name] is a 63 y.o. male who presented to the ED today with systemic weakness fatigue detailed above.    Patient placed on continuous vitals and telemetry monitoring while in ED which was reviewed periodically.  Complete initial physical exam performed, notably the patient  was HDS in NAD.    Reviewed and confirmed nursing documentation  for past medical history, family history, social history.    Initial Assessment:   With the patient's presentation of neurologic symptoms, most likely diagnosis is delerium 2/2 infectious etiology (UTI/CAP/URI) vs metabolic abnormality (Na/K/Mg/Ca) vs nonspecific etiology. Other diagnoses were considered including (but not limited to) CVA, ICH, intracranial mass, critical dehydration, heptatic dysfunction, uremia, hypercarbia, intoxication, endrocrine abnormality, toxidrome. These are considered less likely due to history of present illness and physical exam findings.   This is most consistent with an acute life/limb threatening illness complicated by underlying chronic conditions.  Initial Plan:  CTH to evaluate for intracranial etiology of patient's symptoms  Screening labs including CBC and Metabolic panel to evaluate for infectious or metabolic etiology of disease.  Urinalysis with reflex culture ordered to evaluate for UTI or relevant urologic/nephrologic pathology.  CXR to evaluate for structural/infectious intrathoracic pathology.  EKG to evaluate for cardiac pathology Objective evaluation as below reviewed   Initial Study Results:   Laboratory  All laboratory results reviewed without evidence of clinically relevant pathology.     EKG EKG was reviewed independently. Rate, rhythm, axis, intervals all examined and without medically relevant abnormality. ST segments without concerns for elevations.    Radiology:  All images reviewed independently. Agree with radiology report at this time.   CT HEAD WO CONTRAST ( ) Result Date: 11/23/2023 CLINICAL DATA:  Stroke-like symptoms EXAM: CT HEAD WITHOUT CONTRAST TECHNIQUE: Contiguous axial images were obtained from the base of the skull through the vertex without intravenous contrast. RADIATION DOSE REDUCTION: This exam was performed according to the departmental dose-optimization program which includes automated exposure control, adjustment  of the mA and/or kV according to patient size and/or use of iterative reconstruction technique. COMPARISON:  10/08/2023 FINDINGS: Brain: No evidence of acute infarction, hemorrhage, hydrocephalus, extra-axial collection or mass lesion/mass effect. Chronic atrophic and ischemic changes are again seen. Scattered lacunar infarcts are again seen Vascular: No hyperdense vessel or unexpected calcification. Skull: Normal. Negative for fracture or focal lesion. Sinuses/Orbits: No acute finding. Other: None. IMPRESSION: Chronic atrophic and ischemic changes.  No acute abnormality noted. Electronically Signed   By: Oneil Devonshire [name]   On: 11/23/2023 03:09   DG Knee 2 Views Left Result Date: 11/23/2023 CLINICAL DATA:  Recent fall with knee pain, initial encounter EXAM: LEFT KNEE - 2 VIEW COMPARISON:  None Available. FINDINGS: Tricompartmental degenerative changes are noted. Small joint effusion is seen. No acute fracture or dislocation is seen. IMPRESSION: Degenerative change with small joint effusion. Electronically Signed   By: Oneil Devonshire [name]   On: 11/23/2023 02:46   DG Chest 1 View Result Date: 11/22/2023 CLINICAL DATA:  Infection EXAM: CHEST  1 VIEW COMPARISON:  10/08/2023 FINDINGS: The heart size and mediastinal contours are within normal limits. Both lungs are clear. The  visualized skeletal structures are unremarkable. IMPRESSION: No active disease. Electronically Signed   By: Ozell Daring [name]   On: 11/22/2023 18:21     Final Assessment and Plan:   Patient still feels very weak this morning.  With multiple falls, I would be worried about patient safety at home.  However he does not want to go to a rehabilitation center.  He is requesting evaluation for home health assistance and whenever physical therapy resources may be available.  He is in no acute distress at this time, feels comfortable waiting to speak with the social workers this morning who will be on shift in approximately 2 hours. Patient  placed in a social work hold protocol at this time.  Able to be safely dispositioned after consultation with social work.   Clinical Impression:  1. Weakness      Data Unavailable   Final Clinical Impression(s) / ED Diagnoses Final diagnoses:  Weakness    Rx / DC Orders ED Discharge Orders     None         Jerral Meth, MD 11/23/23 [phone]

## 2023-11-23 NOTE — ED Notes (Signed)
 Pt stating he is having increased pain in his left knee. Pt requesting pain medication. RN aware.

## 2023-11-24 ENCOUNTER — Encounter (HOSPITAL_COMMUNITY): Payer: Self-pay

## 2023-11-24 LAB — CBG MONITORING, ED
Glucose-Capillary: 175 mg/dL — ABNORMAL HIGH (ref 70–99)
Glucose-Capillary: 216 mg/dL — ABNORMAL HIGH (ref 70–99)
Glucose-Capillary: 350 mg/dL — ABNORMAL HIGH (ref 70–99)
Glucose-Capillary: 320 mg/dL — ABNORMAL HIGH (ref 70–99)
Glucose-Capillary: 227 mg/dL — ABNORMAL HIGH (ref 70–99)

## 2023-11-24 MED ORDER — ACETAMINOPHEN 325 MG PO TABS
650.0000 mg | ORAL_TABLET | Freq: Four times a day (QID) | ORAL | Status: DC | PRN
  Administered 2023-11-25 – 2023-11-26 (×2): 650 mg via ORAL
  Filled 2023-11-24 (×2): qty 2

## 2023-11-24 NOTE — NC FL2 (Signed)
 [place]  MEDICAID FL2 LEVEL OF CARE FORM     IDENTIFICATION  Patient Name: [name] Birthdate: 1960/04/21 Sex: male Admission Date (Current Location): 11/22/2023  [facility] and [place] Number:  Producer, television/film/video and Address:  [facility] [place], [address] [place], [place] [address]      Provider Number: [id number]  Attending Physician Name and Address:  Jerrol Agent, MD  Relative Name and Phone Number:  cline,becky (Mother)  [phone] [facility])    Current Level of Care: Hospital Recommended Level of Care: Skilled Nursing Facility Prior Approval Number:    Date Approved/Denied:   PASRR Number: [id number]  Discharge Plan: SNF    Current Diagnoses: Patient Active Problem List   Diagnosis Date Noted   UTI (urinary tract infection) 10/08/2023   Weakness 10/08/2023   Dysarthria 10/08/2023   Acute ischemic stroke (HCC) 04/09/2023   History of DVT (deep vein thrombosis) 04/09/2023   Primary hypertension 04/09/2023   Basilar artery stenosis 04/07/2023   Diabetes mellitus (HCC) 09/05/2019   Acute lower UTI 09/05/2019   Ataxia 09/04/2019   Acute cystitis with hematuria    Hyperlipidemia 08/21/2014   Chest pain 08/21/2014   Bronchitis 05/08/2014   Candida rash of groin 05/08/2014   Attention deficit hyperactivity disorder (ADHD) 03/04/2014   Depression 03/04/2014   Erectile dysfunction 03/04/2014   Tinea 03/04/2014   Hypotestosteronism 01/20/2013   HTN (hypertension), benign 08/31/2011   Polycythemia, secondary 08/31/2011   Abnormal liver function test 08/31/2011   Burkitt's lymphoma (HCC) 02/23/2011    Orientation RESPIRATION BLADDER Height & Weight     Self, Time, Situation, Place  Normal Continent Weight: 221 lb (100.2 kg) Height:  6' 1 (185.4 cm)  BEHAVIORAL SYMPTOMS/MOOD NEUROLOGICAL BOWEL NUTRITION STATUS      Continent Diet (see d/c summary)  AMBULATORY STATUS COMMUNICATION OF NEEDS Skin   Limited Assist Verbally  Surgical wounds (laproscopic abdomen)                       Personal Care Assistance Level of Assistance  Bathing, Feeding, Dressing Bathing Assistance: Limited assistance Feeding assistance: Independent Dressing Assistance: Limited assistance     Functional Limitations Info  Sight, Hearing, Speech Sight Info: Adequate Hearing Info: Adequate Speech Info: Adequate    SPECIAL CARE FACTORS FREQUENCY  OT (By licensed OT), PT (By licensed PT)     PT Frequency: 5x/week OT Frequency: 5x/week            Contractures Contractures Info: Not present    Additional Factors Info  Code Status, Allergies Code Status Info: full code Allergies Info: Vicodin (hydrocodone-acetaminophen ), codeine            Current Medications (11/24/2023):  This is the current hospital active medication list Current Facility-Administered Medications  Medication Dose Route Frequency Provider Last Rate Last Admin   acetaminophen  (TYLENOL ) tablet 650 mg  650 mg Oral Q6H PRN Jerrol Agent, MD       apixaban  (ELIQUIS ) tablet 5 mg  5 mg Oral BID Naasz, Hayley N, MD   5 mg at 11/24/23 0926   aspirin  EC tablet 81 mg  81 mg Oral Daily Naasz, Hayley N, MD   81 mg at 11/24/23 9073   atorvastatin  (LIPITOR) tablet 40 mg  40 mg Oral Daily Naasz, Hayley N, MD   40 mg at 11/24/23 9073   dorzolamide -timolol  (COSOPT ) 2-0.5 % ophthalmic solution 1 drop  1 drop Both Eyes BID Franklyn Sid SAILOR, MD  1 drop at 11/23/23 2157   insulin  aspart (novoLOG ) injection 0-5 Units  0-5 Units Subcutaneous QHS Franklyn Sid SAILOR, MD       insulin  aspart (novoLOG ) injection 0-9 Units  0-9 Units Subcutaneous TID WC Naasz, Hayley N, MD   2 Units at 11/24/23 0741   lisinopril  (ZESTRIL ) tablet 40 mg  40 mg Oral Daily Naasz, Hayley N, MD   40 mg at 11/24/23 0926   sertraline (ZOLOFT) tablet 50 mg  50 mg Oral Daily Naasz, Hayley N, MD   50 mg at 11/24/23 9073   Current Outpatient Medications  Medication Sig Dispense Refill   acetaminophen   (TYLENOL ) 650 MG CR tablet Take 1,300 mg by mouth in the morning.     apixaban  (ELIQUIS ) 5 MG TABS tablet Take 1 tablet (5 mg total) by mouth 2 (two) times daily. 60 tablet 0   aspirin  EC 81 MG tablet Take 81 mg by mouth in the morning. Swallow whole.     atorvastatin  (LIPITOR) 40 MG tablet Take 1 tablet (40 mg total) by mouth daily. 30 tablet 0   Continuous Glucose Sensor (DEXCOM G7 SENSOR) MISC Change every 10 days to monitor blood glucose continuously     dorzolamide -timolol  (COSOPT ) 22.3-6.8 MG/ML ophthalmic solution Place 1 drop into both eyes 2 (two) times daily.     insulin  lispro (HUMALOG ) 100 UNIT/ML KwikPen Inject 2-10 Units into the skin 3 (three) times daily as needed (with meals). Per sliding scale:   CHECK BLOOD SUGARS BEFORE EACH MEAL AND FOR BLOOD SUGARS 150-200 INJECT 2 UNITS, 200-250 INJECT 4 UNITS, 250-300 INJECT 6 UNITS, 300-350 INJECT 8 UNITS, >350 INJECT 10 UNITS CHECK BLOOD SUGARS BEFORE EACH MEAL AND FOR BLOOD SUGARS 150-200 INJECT 2 UNITS, 200-250 INJECT 4 UNITS, 250-300 INJECT 6 UNITS, 300-350 INJECT 8 UNITS, >350 INJECT 10 UNITS     LANTUS  SOLOSTAR 100 UNIT/ML Solostar Pen Inject 26 Units into the skin in the morning.     lisinopril  (PRINIVIL ,ZESTRIL ) 40 MG tablet Take 40 mg by mouth in the morning.     Netarsudil-Latanoprost  (ROCKLATAN) 0.02-0.005 % SOLN Place 1 drop into both eyes in the morning and at bedtime.     omeprazole (PRILOSEC) 20 MG capsule Take 20 mg by mouth daily before breakfast.     Probiotic Product (PROBIOTIC PO) Take 1 capsule by mouth in the morning. *10 strain probiotic* contains 10 strains of beneficial bacteria with 10 mg b infantis     sertraline (ZOLOFT) 50 MG tablet Take 50 mg by mouth in the morning.     testosterone  cypionate (DEPOTESTOSTERONE CYPIONATE) 200 MG/ML injection Inject 200 mg into the muscle every 14 (fourteen) days.     traMADol  (ULTRAM ) 50 MG tablet Take 1 tablet (50 mg total) by mouth every 6 (six) hours as needed. (Patient taking  differently: Take 50 mg by mouth every 6 (six) hours as needed for moderate pain (pain score 4-6) or severe pain (pain score 7-10).) 20 tablet 0   Blood Glucose Monitoring Suppl (BLOOD GLUCOSE MONITOR SYSTEM) w/Device KIT 1 each by Does not apply route 3 (three) times daily. May dispense any manufacturer covered by patient's insurance. 1 kit 0   Glucose Blood (BLOOD GLUCOSE TEST STRIPS) STRP Use 1 each 3 (three) times daily as directed to check blood sugar. May dispense any manufacturer covered by patient's insurance and fits patient's device. 100 strip 0   Insulin  Pen Needle 32G X 4 MM MISC Use 1 each 3 (three) times daily. May dispense any  manufacturer covered by [employer]. 100 each 0   Lancet Device MISC 1 each by Does not apply route 3 (three) times daily. May dispense any manufacturer covered by patient's insurance. 1 each 0   Lancets (ONETOUCH DELICA PLUS LANCET33G) MISC 1 each 3 (three) times daily. Use as directed to check blood sugar. May dispense any manufacturer covered by patient's insurance and fits patient's device. 100 each 0     Discharge Medications: Please see discharge summary for a list of discharge medications.  Relevant Imaging Results:  Relevant Lab Results:   Additional Information SSN [id number] [place], [place]

## 2023-11-24 NOTE — Evaluation (Signed)
 Physical Therapy Evaluation Patient Details Name: [name] MRN: [id number] DOB: 1960/11/04 Today's Date: 11/24/2023  History of Present Illness  63 y.o. male presents to [facility] hospital on 11/23/2023 with diffuse weakness after recent hernia repair. Pt with multiple recent falls. PMH includes: HLD, HTN, DVT on Xarelto , Burkitts lymphoma, and prior L cerebellar stroke with baseline dysequilibrium.  Clinical Impression  Pt presents to PT with deficits in strength, power, ROM, balance, functional mobility. Pt is limited by L knee pain throughout session, unable to tolerate weightbearing through LLE during standing. Pt requires significant physical assistance to perform all functional mobility tasks other than rolling currently. Pt remains at a high risk for falls due to weakness and imbalance. Patient will benefit from continued inpatient follow up therapy, <3 hours/day.        If plan is discharge home, recommend the following: A lot of help with bathing/dressing/bathroom;Assistance with cooking/housework;Two people to help with walking and/or transfers;Assist for transportation;Help with stairs or ramp for entrance;Supervision due to cognitive status   Can travel by private vehicle   No    Equipment Recommendations Wheelchair (measurements PT);Wheelchair cushion (measurements PT);Hospital bed  Recommendations for Other Services       Functional Status Assessment Patient has had a recent decline in their functional status and demonstrates the ability to make significant improvements in function in a reasonable and predictable amount of time.     Precautions / Restrictions Precautions Precautions: Fall Recall of Precautions/Restrictions: Intact Restrictions Weight Bearing Restrictions Per Provider Order: No      Mobility  Bed Mobility Overal bed mobility: Needs Assistance Bed Mobility: Rolling, Supine to Sit, Sit to Supine Rolling: Contact guard assist   Supine to sit: Mod  assist Sit to supine: Mod assist        Transfers Overall transfer level: Needs assistance Equipment used: Rolling walker (2 wheels) Transfers: Sit to/from Stand, Bed to chair/wheelchair/BSC Sit to Stand: Mod assist, From elevated surface Stand pivot transfers: Max assist, From elevated surface         General transfer comment: PT assists pt in 2 sit to stands with support of RW. Pt also performs SPT to and from [facility] with face to face assist and R knee block from PT    Ambulation/Gait Ambulation/Gait assistance:  (unable to ambulate due to poor tolerance for weightbearing through LLE)                Stairs            Wheelchair Mobility     Tilt Bed    Modified Rankin (Stroke Patients Only)       Balance Overall balance assessment: Needs assistance Sitting-balance support: Single extremity supported, Feet supported Sitting balance-Leahy Scale: Poor     Standing balance support: Bilateral upper extremity supported, Reliant on assistive device for balance Standing balance-Leahy Scale: Poor Standing balance comment: poor tolerance for standing                             Pertinent Vitals/Pain Pain Assessment Pain Assessment: 0-10 Pain Score: 10-Worst pain ever Pain Location: L knee Pain Descriptors / Indicators: Sore Pain Intervention(s): Monitored during session    Home Living Family/patient expects to be discharged to:: Private residence Living Arrangements: Parent Available Help at Discharge: Family;Available PRN/intermittently Type of Home: House Home Access: Stairs to enter Entrance Stairs-Rails: Right;Left;Can reach both Entrance Stairs-Number of Steps: 4   Home Layout: One level Home Equipment:  Cane - single point;Shower seat      Prior Function Prior Level of Function : Independent/Modified Independent;Driving;History of Falls (last six months)             Mobility Comments: Carrys cane for safety but mostly does not  use it. ADLs Comments: independent, driving.     Extremity/Trunk Assessment   Upper Extremity Assessment Upper Extremity Assessment: Generalized weakness    Lower Extremity Assessment Lower Extremity Assessment: Generalized weakness;LLE deficits/detail LLE Deficits / Details: L knee flexion limited by pain, able to flex knee to ~90 degrees, strength 4-/5 through available limited range    Cervical / Trunk Assessment Cervical / Trunk Assessment: Kyphotic  Communication   Communication Communication: Impaired Factors Affecting Communication: Reduced clarity of speech;Difficulty expressing self    Cognition Arousal: Alert Behavior During Therapy: WFL for tasks assessed/performed   PT - Cognitive impairments: Awareness, Problem solving, Safety/Judgement                         Following commands: Intact       Cueing Cueing Techniques: Verbal cues     General Comments General comments (skin integrity, edema, etc.): pt with small liquid bowel movement when on [facility]    Exercises     Assessment/Plan    PT Assessment Patient needs continued PT services  PT Problem List Decreased strength;Decreased activity tolerance;Decreased range of motion;Decreased balance;Decreased mobility;Decreased cognition;Decreased knowledge of use of DME;Decreased knowledge of precautions;Decreased safety awareness;Pain       PT Treatment Interventions DME instruction;Gait training;Stair training;Functional mobility training;Therapeutic activities;Therapeutic exercise;Balance training;Neuromuscular re-education;Cognitive remediation;Patient/family education;Wheelchair mobility training    PT Goals (Current goals can be found in the Care Plan section)  Acute Rehab PT Goals Patient Stated Goal: to return to ambulation, reduce pain PT Goal Formulation: With patient Time For Goal Achievement: 12/08/23 Potential to Achieve Goals: Fair    Frequency Min 2X/week     Co-evaluation                AM-PAC PT 6 Clicks Mobility  Outcome Measure Help needed turning from your back to your side while in a flat bed without using bedrails?: A Little Help needed moving from lying on your back to sitting on the side of a flat bed without using bedrails?: A Lot Help needed moving to and from a bed to a chair (including a wheelchair)?: A Lot Help needed standing up from a chair using your arms (e.g., wheelchair or bedside chair)?: A Lot Help needed to walk in hospital room?: Total Help needed climbing 3-5 steps with a railing? : Total 6 Click Score: 11    End of Session Equipment Utilized During Treatment: Gait belt Activity Tolerance: Patient tolerated treatment well Patient left: in bed;with call bell/phone within reach Nurse Communication: Mobility status;Need for lift equipment PT Visit Diagnosis: Other abnormalities of gait and mobility (R26.89);Muscle weakness (generalized) (M62.81);History of falling (Z91.81)    Time: 9166-9069 PT Time Calculation (min) (ACUTE ONLY): 57 min   Charges:   PT Evaluation $PT Eval Low Complexity: 1 Low PT Treatments $Therapeutic Activity: 8-22 mins PT General Charges $$ ACUTE PT VISIT: 1 Visit         Bernardino JINNY Ruth, PT, DPT Acute Rehabilitation Office [phone]   Bernardino JINNY Ruth 11/24/2023, 9:45 AM

## 2023-11-24 NOTE — Progress Notes (Addendum)
 CSW provided SNF bed offers to pt. He is interested in [facility]. [facility] initially offered but has to confirm that they can take him with his commercial insurance. Awaiting update from SNF.   1230: [facility] cannot offer bed anymore. CSW updated pt. He alternatively chooses [facility]. Awaiting confirmation from [facility].

## 2023-11-24 NOTE — ED Provider Notes (Signed)
 Emergency Medicine Observation Re-evaluation Note  [name] is a 63 y.o. male, seen on rounds today.  Pt initially presented to the ED for complaints of Weakness Currently, the patient is resting.  Physical Exam  BP (!) 147/93   Pulse 66   Temp 97.7 F (36.5 C) (Oral)   Resp 17   Ht 6' 1 (1.854 m)   Wt 100.2 kg   SpO2 95%   BMI 29.16 kg/m  Physical Exam General: NAD   ED Course / MDM  EKG:EKG Interpretation Date/Time:  Monday November 22 2023 16:16:32 EDT Ventricular Rate:  101 PR Interval:  152 QRS Duration:  88 QT Interval:  324 QTC Calculation: 420 R Axis:   -5  Text Interpretation: Sinus tachycardia Otherwise normal ECG When compared with ECG of 08-Oct-2023 17:31, PREVIOUS ECG IS PRESENT Confirmed by Jerral Meth [id number]) on 11/23/2023 12:37:50 AM  I have reviewed the labs performed to date as well as medications administered while in observation.  Recent changes in the last 24 hours include seen overnight for diffuse weakness yesterday, multiple falls, lives alone at home, currently now a boarder pending SNF placement.  Plan  Current plan is for SW for SNF placement.    Jerrol Agent, MD 11/24/23 [phone]

## 2023-11-24 NOTE — ED Notes (Signed)
PT in with patient at this time.

## 2023-11-25 LAB — CBG MONITORING, ED
Glucose-Capillary: 197 mg/dL — ABNORMAL HIGH (ref 70–99)
Glucose-Capillary: 332 mg/dL — ABNORMAL HIGH (ref 70–99)
Glucose-Capillary: 306 mg/dL — ABNORMAL HIGH (ref 70–99)
Glucose-Capillary: 153 mg/dL — ABNORMAL HIGH (ref 70–99)

## 2023-11-25 MED ORDER — INSULIN GLARGINE-YFGN 100 UNIT/ML ~~LOC~~ SOLN
26.0000 [IU] | Freq: Every day | SUBCUTANEOUS | Status: DC
Start: 2023-11-25 — End: 2023-11-29
  Administered 2023-11-25 – 2023-11-27 (×3): 26 [IU] via SUBCUTANEOUS
  Filled 2023-11-25 (×7): qty 0.26

## 2023-11-25 MED ORDER — INSULIN ASPART 100 UNIT/ML IJ SOLN
4.0000 [IU] | Freq: Three times a day (TID) | INTRAMUSCULAR | Status: DC
  Administered 2023-11-25 – 2023-11-29 (×8): 4 [IU] via SUBCUTANEOUS

## 2023-11-25 NOTE — ED Notes (Signed)
 Pt noted to be eating [employer] brought in by visitor at time of CBG check. Pt educated on carb modified diet and to avoid eating fast food.

## 2023-11-25 NOTE — Inpatient Diabetes Management (Signed)
 Inpatient Diabetes Program Recommendations  AACE/ADA: New Consensus Statement on Inpatient Glycemic Control (2015)  Target Ranges:  Prepandial:   less than 140 mg/dL      Peak postprandial:   less than 180 mg/dL (1-2 hours)      Critically ill patients:  140 - 180 mg/dL   Lab Results  Component Value Date   GLUCAP 332 (H) 11/25/2023   HGBA1C 8.2 (H) 11/15/2023    Latest Reference Range & Units 11/24/23 07:34 11/24/23 13:03 11/24/23 17:05 11/24/23 17:41 11/24/23 22:13 11/25/23 08:08 11/25/23 12:40  Glucose-Capillary 70 - 99 mg/dL 824 (H) 783 (H) 649 (H) 320 (H) 227 (H) 197 (H) 332 (H)  (H): Data is abnormally high  Diabetes history: DM2 Outpatient Diabetes medications:  Lantus  26 units daily Humalog  2-10 units tid meal coverage Current orders for Inpatient glycemic control: Novolog  0-9 units tid, 0-5 units hs  Inpatient Diabetes Program Recommendations:   Noted CBG 332. No basal or meal coverage ordered yet. Please consider: -Add Lantus  26 units now and daily -Add Novolog  4 units tid meal coverage if eats 50% meal -Add Carb mod to diet if appropriate  Thank you, Dagoberto E. Arnell Slivinski, RN, MSN, CNS, CDCES  Diabetes Coordinator Inpatient Glycemic Control Team Team Pager [phone] (8am-5pm) 11/25/2023 12:48 PM

## 2023-11-25 NOTE — ED Notes (Signed)
 Pt assisted into sitting position. Attempted to stand patient at bedside. Pt unable to bear weight or stay in unsupported seated position for more than a few seconds. Linens changed. Pt given partial bed bath and changed into hospital gown. Tolerated well.

## 2023-11-25 NOTE — ED Provider Notes (Signed)
 Emergency Medicine Observation Re-evaluation Note  [name] is a 63 y.o. male, seen on rounds today.  Pt initially presented to the ED for complaints of Weakness Currently, the patient is awaiting placement in skilled nursing facility.  Patient has a history of stroke and recently had umbilical hernia repair.  He presented to the ED with increased diffuse weakness and pain in his leg that was thought to be musculoskeletal.  X-rays were obtained and were negative.. Patient was felt to need increased assistance and currently is awaiting placement in a skilled nursing facility Today patient states that he feels like his knee is less painful.  In fact, he states he has no pain in his knee while laying in the bed.  He states he would like to try and put weight on it.  He says he has been in bed for several days and is not sure how it will feel when he puts weight on it.  However, if he can bear weight and walk he would prefer to go back to his home where he uses a cane.  Otherwise he feels like he is at baseline. Physical Exam  BP 114/77 (BP Location: Right Arm)   Pulse 66   Temp 97.6 F (36.4 C) (Oral)   Resp 17   Ht 1.854 m (6' 1)   Wt 100.2 kg   SpO2 95%   BMI 29.16 kg/m  Physical Exam General: Well-developed well-nourished male in no acute distress Cardiac: Normal blood pressure and heart rate Lungs: Normal oxygen saturation Psych: Sleeping at this time  ED Course / MDM  EKG:EKG Interpretation Date/Time:  Monday November 22 2023 16:16:32 EDT Ventricular Rate:  101 PR Interval:  152 QRS Duration:  88 QT Interval:  324 QTC Calculation: 420 R Axis:   -5  Text Interpretation: Sinus tachycardia Otherwise normal ECG When compared with ECG of 08-Oct-2023 17:31, PREVIOUS ECG IS PRESENT Confirmed by Jerral Meth [phone]) on 11/23/2023 12:37:50 AM  I have reviewed the labs performed to date as well as medications administered while in observation.  Recent changes in the last 24  hours include patient had initial skilled nursing facility bed offered and rescinded he is awaiting confirmation from additional facility.  Plan  Current plan is for skilled nursing facility placement. We will attempt to ambulate patient and disposition as appropriate Patient unable to ambulate and continue to appear off balance.  Plan to continue nursing home placement   Levander Houston, MD 11/25/23 1640    Levander Houston, MD 11/26/23 [phone]

## 2023-11-25 NOTE — Progress Notes (Signed)
 Per Tammy with [place], auth remains pending at this time.   Julien Das, MSW, LCSW [phone] (coverage)

## 2023-11-26 LAB — CBG MONITORING, ED
Glucose-Capillary: 177 mg/dL — ABNORMAL HIGH (ref 70–99)
Glucose-Capillary: 192 mg/dL — ABNORMAL HIGH (ref 70–99)
Glucose-Capillary: 175 mg/dL — ABNORMAL HIGH (ref 70–99)
Glucose-Capillary: 170 mg/dL — ABNORMAL HIGH (ref 70–99)

## 2023-11-26 NOTE — Progress Notes (Addendum)
 Per Debbie with [place], [facility] auth remains pending at this time.   1026: Auth received for [facility] but they are not able to admit until tomorrow due to bed availability.  [facility] contact for the weekend is Marval [phone] or Wanda [phone].   Julien Das, MSW, LCSW [phone] (coverage)

## 2023-11-26 NOTE — ED Provider Notes (Signed)
 Emergency Medicine Observation Re-evaluation Note  [name] is a 63 y.o. male, seen on rounds today.  Pt initially presented to the ED for complaints of Weakness Currently, the patient is currently waiting on placement into a skilled nursing facility.  Physical Exam  BP 131/81   Pulse 72   Temp 97.7 F (36.5 C) (Oral)   Resp 17   Ht 6' 1 (1.854 m)   Wt 100.2 kg   SpO2 96%   BMI 29.16 kg/m  Physical Exam General: No distress Cardiac: normal perfusion Lungs: normal RR Psych: calm  ED Course / MDM  EKG:EKG Interpretation Date/Time:  Monday November 22 2023 16:16:32 EDT Ventricular Rate:  101 PR Interval:  152 QRS Duration:  88 QT Interval:  324 QTC Calculation: 420 R Axis:   -5  Text Interpretation: Sinus tachycardia Otherwise normal ECG When compared with ECG of 08-Oct-2023 17:31, PREVIOUS ECG IS PRESENT Confirmed by Jerral Meth [phone]) on 11/23/2023 12:37:50 AM  I have reviewed the labs performed to date as well as medications administered while in observation.  Recent changes in the last 24 hours include N/A.  Plan  Current plan is for placement into a skilled nursing facility.    Scott Caras, DO 11/26/23 [phone]

## 2023-11-27 LAB — CBG MONITORING, ED
Glucose-Capillary: 193 mg/dL — ABNORMAL HIGH (ref 70–99)
Glucose-Capillary: 225 mg/dL — ABNORMAL HIGH (ref 70–99)
Glucose-Capillary: 220 mg/dL — ABNORMAL HIGH (ref 70–99)
Glucose-Capillary: 153 mg/dL — ABNORMAL HIGH (ref 70–99)

## 2023-11-27 MED ORDER — DIPHENHYDRAMINE HCL 25 MG PO CAPS
25.0000 mg | ORAL_CAPSULE | Freq: Once | ORAL | Status: AC
  Administered 2023-11-27: 25 mg via ORAL
  Filled 2023-11-27: qty 1

## 2023-11-27 MED ORDER — HYDROCORTISONE 1 % EX CREA
TOPICAL_CREAM | Freq: Three times a day (TID) | CUTANEOUS | Status: DC
  Filled 2023-11-27: qty 28

## 2023-11-27 NOTE — ED Notes (Signed)
 Family sitting with pt at bedside for lunch. Re adjusted pt in bed

## 2023-11-27 NOTE — ED Notes (Signed)
 Assisted pt with set up of meal tray, changed gown and adjusted in bed

## 2023-11-27 NOTE — ED Notes (Signed)
 Asked green provider for cream to help pt back. Pt reports back itches and is mildly red with rash. No abx given or suspect of allergic reaction. Washed skin with water and changed sheets

## 2023-11-27 NOTE — ED Notes (Signed)
 Assisted pt with breakfast tray set up

## 2023-11-27 NOTE — ED Notes (Signed)
 Provided pt with water.

## 2023-11-27 NOTE — ED Notes (Signed)
 Messaged pharm for medication delivery

## 2023-11-27 NOTE — ED Notes (Signed)
 Visitor at bedside.

## 2023-11-27 NOTE — Progress Notes (Signed)
 CSW contacted Debbie in admissions at [facility] [phone]. CSW was told that they have someone discharging from their facility at 9:30 AM and once they get the room cleaned then she will contact CSW back.    CSW received a call from Debbie at 10:51 AM stating that their resident will not be discharging until 4:00 PM today. CSW was told that patient will need to come tomorrow 11/28/23. CSW was told to contact admissions tomorrow morning and arrange the discharge.

## 2023-11-27 NOTE — ED Provider Notes (Signed)
 Emergency Medicine Observation Re-evaluation Note  [name] is a 63 y.o. male, seen on rounds today.  Pt initially presented to the ED for complaints of Weakness Currently, the patient is awaiting nursing home placement..  Physical Exam  BP (!) 141/99   Pulse 80   Temp 98.7 F (37.1 C)   Resp 18   Ht 1.854 m (6' 1)   Wt 100.2 kg   SpO2 97%   BMI 29.16 kg/m  Physical Exam General: Nontoxic Cardiac:  Lungs: No respiratory distress Psych: Baseline  ED Course / MDM  EKG:EKG Interpretation Date/Time:  Monday November 22 2023 16:16:32 EDT Ventricular Rate:  101 PR Interval:  152 QRS Duration:  88 QT Interval:  324 QTC Calculation: 420 R Axis:   -5  Text Interpretation: Sinus tachycardia Otherwise normal ECG When compared with ECG of 08-Oct-2023 17:31, PREVIOUS ECG IS PRESENT Confirmed by Jerral Meth [phone]) on 11/23/2023 12:37:50 AM  I have reviewed the labs performed to date as well as medications administered while in observation.  Recent changes in the last 24 hours include complaint of itching on the back without any significant rash.  Will provide some hydrocortisone cream..  Plan  Current plan is nursing home placement..    Keimora Swartout, MD 11/27/23 [phone]

## 2023-11-28 LAB — CBG MONITORING, ED
Glucose-Capillary: 180 mg/dL — ABNORMAL HIGH (ref 70–99)
Glucose-Capillary: 161 mg/dL — ABNORMAL HIGH (ref 70–99)
Glucose-Capillary: 162 mg/dL — ABNORMAL HIGH (ref 70–99)
Glucose-Capillary: 172 mg/dL — ABNORMAL HIGH (ref 70–99)

## 2023-11-28 MED ORDER — DIPHENHYDRAMINE HCL 25 MG PO CAPS
25.0000 mg | ORAL_CAPSULE | Freq: Once | ORAL | Status: AC
  Administered 2023-11-28: 25 mg via ORAL
  Filled 2023-11-28: qty 1

## 2023-11-28 MED ORDER — IBUPROFEN 400 MG PO TABS: 600.0000 mg | ORAL_TABLET | Freq: Once | ORAL | Status: DC

## 2023-11-28 NOTE — ED Provider Notes (Signed)
 Emergency Medicine Observation Re-evaluation Note  [name] is a 63 y.o. male, seen on rounds today.  Pt initially presented to the ED for complaints of Weakness Currently, the patient is awaiting rehab placement.  Patient has weakness patient has a left knee nonbony injury.  Best we can tell they are working on [name] for placement.  Physical Exam  BP (!) 139/110   Pulse 78   Temp 97.8 F (36.6 C) (Oral)   Resp 16   Ht 1.854 m (6' 1)   Wt 100.2 kg   SpO2 98%   BMI 29.16 kg/m  Physical Exam General: Nontoxic no acute distress Cardiac:  Lungs: No respiratory distress Psych: Baseline  ED Course / MDM  EKG:EKG Interpretation Date/Time:  Monday November 22 2023 16:16:32 EDT Ventricular Rate:  101 PR Interval:  152 QRS Duration:  88 QT Interval:  324 QTC Calculation: 420 R Axis:   -5  Text Interpretation: Sinus tachycardia Otherwise normal ECG When compared with ECG of 08-Oct-2023 17:31, PREVIOUS ECG IS PRESENT Confirmed by Jerral Meth [phone]) on 11/23/2023 12:37:50 AM  I have reviewed the labs performed to date as well as medications administered while in observation.  Recent changes in the last 24 hours include awaiting update from social worker on placement.  Plan  Current plan is for nursing facility placement.    Riyad Keena, MD 11/28/23 1244

## 2023-11-29 LAB — CBG MONITORING, ED: Glucose-Capillary: 186 mg/dL — ABNORMAL HIGH (ref 70–99)

## 2023-11-29 NOTE — ED Notes (Signed)
 PT discharged to [facility] facility. Breathing is even and unlabored.  IV removed.

## 2023-11-29 NOTE — ED Notes (Signed)
 Transport at bedside

## 2023-11-29 NOTE — ED Notes (Signed)
 Appropriate paperwork given to transport.

## 2023-11-29 NOTE — Progress Notes (Signed)
 CSW confirmed with Marval at [facility] that pt can admit today. Pt going to [facility]. RN to arrange [facility] transport and call report to [phone]

## 2023-11-29 NOTE — ED Notes (Signed)
 Report called Darold LPN to facility. No further questions at this time.

## 2023-11-29 NOTE — ED Provider Notes (Signed)
 Emergency Medicine Observation Re-evaluation Note  [name] is a 63 y.o. male, seen on rounds today.  Pt initially presented to the ED for complaints of Weakness Currently, the patient is awake, eating and talking on the phone.   Physical Exam  BP 129/87   Pulse 67   Temp 98 F (36.7 C)   Resp 18   Ht 6' 1 (1.854 m)   Wt 100.2 kg   SpO2 99%   BMI 29.16 kg/m  Physical Exam General: NAD Lungs: No respiratory distress Psych: Calm cooperative  ED Course / MDM  EKG:EKG Interpretation Date/Time:  Monday November 22 2023 16:16:32 EDT Ventricular Rate:  101 PR Interval:  152 QRS Duration:  88 QT Interval:  324 QTC Calculation: 420 R Axis:   -5  Text Interpretation: Sinus tachycardia Otherwise normal ECG When compared with ECG of 08-Oct-2023 17:31, PREVIOUS ECG IS PRESENT Confirmed by Jerral Meth [phone]) on 11/23/2023 12:37:50 AM  I have reviewed the labs performed to date as well as medications administered while in observation.  Recent changes in the last 24 hours include discharge today.   Plan  Current plan is for d/c to [facility] today    [name], [place] 11/29/23 [phone]

## 2023-12-09 ENCOUNTER — Other Ambulatory Visit: Payer: Self-pay

## 2023-12-09 ENCOUNTER — Ambulatory Visit (INDEPENDENT_AMBULATORY_CARE_PROVIDER_SITE_OTHER): Admitting: Physician Assistant

## 2023-12-09 DIAGNOSIS — M25562 Pain in left knee: Secondary | ICD-10-CM | POA: Diagnosis not present

## 2023-12-09 DIAGNOSIS — G8929 Other chronic pain: Secondary | ICD-10-CM

## 2023-12-09 MED ORDER — BUPIVACAINE HCL 0.25 % IJ SOLN
5.0000 mL | INTRAMUSCULAR | Status: AC | PRN
  Administered 2023-12-09: 5 mL via INTRA_ARTICULAR

## 2023-12-09 MED ORDER — METHYLPREDNISOLONE ACETATE 40 MG/ML IJ SUSP
40.0000 mg | INTRAMUSCULAR | Status: AC | PRN
  Administered 2023-12-09: 40 mg via INTRA_ARTICULAR

## 2023-12-09 MED ORDER — LIDOCAINE HCL 1 % IJ SOLN
5.0000 mL | INTRAMUSCULAR | Status: AC | PRN
  Administered 2023-12-09: 5 mL

## 2023-12-09 NOTE — Progress Notes (Addendum)
 Office Visit Note   Patient: [name]           Date of Birth: 12/31/1960           MRN: [id number] Visit Date: 12/09/2023              Requested by: Macarthur Elouise SQUIBB, DO [address] [place],  [place] [address] PCP: Lazoff, Shawn P, DO   Assessment & Plan: Visit Diagnoses:  1. Chronic pain of left knee     Plan: Impression is aggravation of underlying left knee osteoarthritis.  Today, we discussed left knee aspiration followed by cortisone injection.  He is agreeable to this plan.  Approximately 15 cc of blood-tinged fluid was aspirated.  Cortisone was then injected.  He tolerated this well.  Follow-up as needed.  Follow-Up Instructions: Return if symptoms worsen or fail to improve.   Orders:  Orders Placed This Encounter  Procedures   Large Joint Inj: L knee   Meds ordered this encounter  Medications   bupivacaine (MARCAINE) 0.25 % (with pres) injection 5 mL   lidocaine  (XYLOCAINE ) 1 % (with pres) injection 5 mL   methylPREDNISolone acetate (DEPO-MEDROL) injection 40 mg      Procedures: Large Joint Inj: L knee on 12/09/2023 9:54 AM Indications: pain Details: 22 G needle, anterolateral approach Medications: 5 mL lidocaine  1 %; 5 mL bupivacaine 0.25 %; 40 mg methylPREDNISolone acetate 40 MG/ML      Clinical Data: No additional findings.   Subjective: Chief Complaint  Patient presents with   Left Knee - Pain    HPI patient is a pleasant 63 year old gentleman who currently resides at a skilled nursing facility who comes in today with acute on chronic left knee pain.  He has a history of underlying arthritis and has undergone what sounds like viscosupplementation injections in the past with good relief.  Last injections were years ago.  Unsure whether he is undergone cortisone injections.  Currently ambulating with a cane but notes he fell about 2 weeks ago with his left foot hyperflexed.  He underwent x-rays which were unremarkable for fracture.  He is  having pain with certain movements of the knee as well as with bearing weight.  Review of Systems as detailed in HPI.  All others reviewed and are negative.   Objective: Vital Signs: There were no vitals taken for this visit.  Physical Exam well-developed and well-nourished gentleman in no acute distress.  Alert and oriented x 3.  Ortho Exam left knee exam: Small effusion.  Range of motion: He can actively extend his knee to about 30 degrees.  Unable to passively extended almost all the way.  He has flexion to about 100 degrees.  Medial joint line tenderness.  Collaterals are stable.  He is neurovascular intact distally.  Specialty Comments:  No specialty comments available.  Imaging: No new imaging   PMFS History: Patient Active Problem List   Diagnosis Date Noted   UTI (urinary tract infection) 10/08/2023   Weakness 10/08/2023   Dysarthria 10/08/2023   Acute ischemic stroke (HCC) 04/09/2023   History of DVT (deep vein thrombosis) 04/09/2023   Primary hypertension 04/09/2023   Basilar artery stenosis 04/07/2023   Diabetes mellitus (HCC) 09/05/2019   Acute lower UTI 09/05/2019   Ataxia 09/04/2019   Acute cystitis with hematuria    Hyperlipidemia 08/21/2014   Chest pain 08/21/2014   Bronchitis 05/08/2014   Candida rash of groin 05/08/2014   Attention deficit hyperactivity disorder (ADHD) 03/04/2014  Depression 03/04/2014   Erectile dysfunction 03/04/2014   Tinea 03/04/2014   Hypotestosteronism 01/20/2013   HTN (hypertension), benign 08/31/2011   Polycythemia, secondary 08/31/2011   Abnormal liver function test 08/31/2011   Burkitt's lymphoma (HCC) 02/23/2011   Past Medical History:  Diagnosis Date   Abnormal liver function test 08/31/2011   Mild elevation bilirubin, normal/high hemoglobin, normal LDH   Adult ADHD    Aneurysm 10/23/2014   4mm left anterior communicating artery region, noted on MRI head   Anxiety    Burkitt's lymphoma (HCC)    followed by Dr.  Arvella Hof   Cerebellar infarct [facility]) 10/23/2014   Chronic, noted on MRI head   Chest pain    Depression    Diabetes mellitus without complication (HCC)    DVT (deep venous thrombosis) (HCC) 12/2015   Left leg   Elevated PSA    Glaucoma    Headache    History of double vision 2016   HTN (hypertension), benign 08/31/2011   Hyperlipidemia    Hypotestosteronemia    Polycythemia, secondary 08/31/2011   Likely due to testosterone  injections   Stroke (HCC)    White matter disease 10/23/2014   noted on MRI head, extensive    Family History  Problem Relation Age of Onset   Stroke Father        Deceased   Hypertension Mother        Living    Past Surgical History:  Procedure Laterality Date   CEREBRAL ANGIOGRAM  11/23/2014   HERNIA REPAIR     KNEE SURGERY Left    SHOULDER SURGERY     UMBILICAL HERNIA REPAIR N/A 11/18/2023   Procedure: REPAIR, HERNIA, UMBILICAL, LAPAROSCOPIC;  Surgeon: Rubin Calamity, MD;  Location: [facility] OR;  Service: General;  Laterality: N/A;   Social History   Occupational History   Not on file  Tobacco Use   Smoking status: Never   Smokeless tobacco: Never  Vaping Use   Vaping status: Never Used  Substance and Sexual Activity   Alcohol  use: Yes    Comment: occassionaly   Drug use: No   Sexual activity: Not Currently

## 2023-12-31 ENCOUNTER — Emergency Department (HOSPITAL_COMMUNITY)

## 2023-12-31 ENCOUNTER — Other Ambulatory Visit: Payer: Self-pay

## 2023-12-31 ENCOUNTER — Encounter (HOSPITAL_COMMUNITY): Payer: Self-pay

## 2023-12-31 ENCOUNTER — Emergency Department (HOSPITAL_COMMUNITY)
Admission: EM | Admit: 2023-12-31 | Discharge: 2023-12-31 | Disposition: A | Discharge status: Skilled Nursing Facility | Attending: Emergency Medicine | Admitting: Emergency Medicine

## 2023-12-31 DIAGNOSIS — Z794 Long term (current) use of insulin: Secondary | ICD-10-CM | POA: Diagnosis not present

## 2023-12-31 DIAGNOSIS — Z7982 Long term (current) use of aspirin: Secondary | ICD-10-CM | POA: Diagnosis not present

## 2023-12-31 DIAGNOSIS — W050XXA Fall from non-moving wheelchair, initial encounter: Secondary | ICD-10-CM | POA: Diagnosis not present

## 2023-12-31 DIAGNOSIS — M25562 Pain in left knee: Secondary | ICD-10-CM | POA: Insufficient documentation

## 2023-12-31 DIAGNOSIS — Z7901 Long term (current) use of anticoagulants: Secondary | ICD-10-CM | POA: Diagnosis not present

## 2023-12-31 DIAGNOSIS — S0083XA Contusion of other part of head, initial encounter: Secondary | ICD-10-CM | POA: Insufficient documentation

## 2023-12-31 DIAGNOSIS — S0990XA Unspecified injury of head, initial encounter: Secondary | ICD-10-CM

## 2023-12-31 DIAGNOSIS — E119 Type 2 diabetes mellitus without complications: Secondary | ICD-10-CM | POA: Insufficient documentation

## 2023-12-31 DIAGNOSIS — S0993XA Unspecified injury of face, initial encounter: Secondary | ICD-10-CM | POA: Diagnosis present

## 2023-12-31 DIAGNOSIS — Z8572 Personal history of non-Hodgkin lymphomas: Secondary | ICD-10-CM | POA: Insufficient documentation

## 2023-12-31 DIAGNOSIS — G8929 Other chronic pain: Secondary | ICD-10-CM | POA: Insufficient documentation

## 2023-12-31 DIAGNOSIS — I10 Essential (primary) hypertension: Secondary | ICD-10-CM | POA: Diagnosis not present

## 2023-12-31 DIAGNOSIS — W19XXXA Unspecified fall, initial encounter: Secondary | ICD-10-CM

## 2023-12-31 MED ORDER — OXYCODONE-ACETAMINOPHEN 5-325 MG PO TABS: 1.0000 | ORAL_TABLET | Freq: Four times a day (QID) | ORAL | 0 refills | Status: AC | PRN

## 2023-12-31 MED ORDER — OXYCODONE-ACETAMINOPHEN 5-325 MG PO TABS
1.0000 | ORAL_TABLET | Freq: Once | ORAL | Status: AC
  Administered 2023-12-31: 1 via ORAL
  Filled 2023-12-31: qty 1

## 2023-12-31 MED ORDER — TRAMADOL HCL 50 MG PO TABS
50.0000 mg | ORAL_TABLET | Freq: Once | ORAL | Status: AC
  Administered 2023-12-31: 50 mg via ORAL
  Filled 2023-12-31: qty 1

## 2023-12-31 NOTE — ED Notes (Signed)
 ED Provider at bedside.

## 2023-12-31 NOTE — Discharge Instructions (Signed)
 Your x-rays and CT scans are stable with no new or acute injuries.  You do have some arthritis in that left knee which is probably the source of your pain.  I prescribed you a short course of pain medication that you may take in place of your tramadol  which is a stronger pain reliever.  I recommend follow-up with your primary provider for further evaluation of your symptoms if not resolved over the next week.

## 2023-12-31 NOTE — ED Triage Notes (Signed)
 Pt arrived via [facility] from [facility] where Pt resides receiving rehab and therapy following recent mini-strokes. Pt reports he is on baby ASA and Eliquis  daily. Pt reports he is unsure how he fell forward out of his wheelchair and does present with swelling to his left face below his eye. Pt reports dysphagia and left side weakness at baseline following recent strokes. Pt also reports recurrent left knee pain as well.

## 2023-12-31 NOTE — ED Notes (Signed)
 EMS called for transport back to [facility].

## 2024-01-03 NOTE — ED Provider Notes (Signed)
 [place] EMERGENCY DEPARTMENT AT [facility] Provider Note   CSN: [id number] Arrival date & time: 12/31/23  1537     Patient presents with: [name] is a 63 y.o. male with a history including Burkitt's lymphoma, hypertension, hyperlipidemia, type 2 diabetes, who is currently in rehab at [facility] secondary to an acute ischemic stroke presenting for evaluation of injury sustained in a fall when he fell out of his wheelchair prior to arrival.  He does take baby aspirin  and Eliquis  daily.  He hit the left side of his face in the fall on a table he was sitting next to.  He also reports acute on chronic pain in his left knee since he fell.  He has had no treatment prior to arrival.  He denies nausea or vomiting, he does have mild headache, denies neck pain, vision changes, denies other injury.  He has had no treatment prior to arrival.   The history is provided by the patient.       Prior to Admission medications   Medication Sig Start Date End Date Taking? Authorizing Provider  oxyCODONE -acetaminophen  (PERCOCET/ROXICET) 5-325 MG tablet Take 1 tablet by mouth every 6 (six) hours as needed for severe pain (pain score 7-10). 12/31/23  Yes Dailah Opperman, PA-C  acetaminophen  (TYLENOL ) 650 MG CR tablet Take 1,300 mg by mouth in the morning.    [provider]  apixaban  (ELIQUIS ) 5 MG TABS tablet Take 1 tablet (5 mg total) by mouth 2 (two) times daily. 04/09/23   Remi Pippin, NP  aspirin  EC 81 MG tablet Take 81 mg by mouth in the morning. Swallow whole.    [provider]  atorvastatin  (LIPITOR) 40 MG tablet Take 1 tablet (40 mg total) by mouth daily. 04/10/23   Remi Pippin, NP  Blood Glucose Monitoring Suppl (BLOOD GLUCOSE MONITOR SYSTEM) w/Device KIT 1 each by Does not apply route 3 (three) times daily. May dispense any manufacturer covered by patient's insurance. 04/09/23   Remi Pippin, NP  Continuous Glucose Sensor (DEXCOM G7 SENSOR) MISC Change  every 10 days to monitor blood glucose continuously 04/02/23   [provider]  dorzolamide -timolol  (COSOPT ) 22.3-6.8 MG/ML ophthalmic solution Place 1 drop into both eyes 2 (two) times daily. 08/13/19   [provider]  Glucose Blood (BLOOD GLUCOSE TEST STRIPS) STRP Use 1 each 3 (three) times daily as directed to check blood sugar. May dispense any manufacturer covered by patient's insurance and fits patient's device. 04/09/23   Remi Pippin, NP  insulin  lispro (HUMALOG ) 100 UNIT/ML KwikPen Inject 2-10 Units into the skin 3 (three) times daily as needed (with meals). Per sliding scale:   CHECK BLOOD SUGARS BEFORE EACH MEAL AND FOR BLOOD SUGARS 150-200 INJECT 2 UNITS, 200-250 INJECT 4 UNITS, 250-300 INJECT 6 UNITS, 300-350 INJECT 8 UNITS, >350 INJECT 10 UNITS CHECK BLOOD SUGARS BEFORE EACH MEAL AND FOR BLOOD SUGARS 150-200 INJECT 2 UNITS, 200-250 INJECT 4 UNITS, 250-300 INJECT 6 UNITS, 300-350 INJECT 8 UNITS, >350 INJECT 10 UNITS 08/26/23   [provider]  Insulin  Pen Needle 32G X 4 MM MISC Use 1 each 3 (three) times daily. May dispense any manufacturer covered by patient's insurance. 04/09/23   Remi Pippin, NP  Lancet Device MISC 1 each by Does not apply route 3 (three) times daily. May dispense any manufacturer covered by patient's insurance. 04/09/23   Remi Pippin, NP  Lancets [facility] DELICA PLUS [place]) MISC 1 each 3 (three) times daily. Use as  directed to check blood sugar. May dispense any manufacturer covered by patient's insurance and fits patient's device. 04/09/23   Remi Pippin, NP  LANTUS  SOLOSTAR 100 UNIT/ML Solostar Pen Inject 26 Units into the skin in the morning.    [provider]  lisinopril  (PRINIVIL ,ZESTRIL ) 40 MG tablet Take 40 mg by mouth in the morning. 08/07/14   [provider]  Netarsudil-Latanoprost  (ROCKLATAN) 0.02-0.005 % SOLN Place 1 drop into both eyes in the morning and at bedtime.    [provider]  omeprazole  (PRILOSEC) 20 MG capsule Take 20 mg by mouth daily before breakfast.    [provider]  Probiotic Product (PROBIOTIC PO) Take 1 capsule by mouth in the morning. *10 strain probiotic* contains 10 strains of beneficial bacteria with 10 mg b infantis    [provider]  sertraline  (ZOLOFT ) 50 MG tablet Take 50 mg by mouth in the morning. 09/30/23   [provider]  testosterone  cypionate (DEPOTESTOSTERONE CYPIONATE) 200 MG/ML injection Inject 200 mg into the muscle every 14 (fourteen) days.    [provider]    Allergies: Codeine  and Vicodin [hydrocodone-acetaminophen ]    Review of Systems  Constitutional:  Negative for fever.  HENT:  Positive for facial swelling. Negative for congestion and sore throat.   Eyes: Negative.   Respiratory:  Negative for chest tightness and shortness of breath.   Cardiovascular:  Negative for chest pain.  Gastrointestinal:  Negative for abdominal pain and nausea.  Genitourinary: Negative.   Musculoskeletal:  Positive for arthralgias. Negative for joint swelling and neck pain.  Skin: Negative.  Negative for rash and wound.  Neurological:  Negative for dizziness, weakness, light-headedness, numbness and headaches.  Psychiatric/Behavioral: Negative.    All other systems reviewed and are negative.   Updated Vital Signs BP 137/87 (BP Location: Right Arm)   Pulse 87   Temp 98.3 F (36.8 C) (Oral)   Resp 17   Ht 6' 1 (1.854 m)   Wt 100.2 kg   SpO2 97%   BMI 29.14 kg/m   Physical Exam Vitals and nursing note reviewed.  Constitutional:      Appearance: He is well-developed.  HENT:     Head: Normocephalic.     Comments: Tender to palpation with mild edema and early bruising of his left cheek.  There is no palpable bony deformity, no hematoma, there is no eye globe involvement in this injury. Eyes:     Conjunctiva/sclera: Conjunctivae normal.  Cardiovascular:     Rate and Rhythm: Normal rate and regular rhythm.      Heart sounds: Normal heart sounds.  Pulmonary:     Effort: Pulmonary effort is normal.     Breath sounds: Normal breath sounds. No wheezing.  Abdominal:     General: Bowel sounds are normal.     Palpations: Abdomen is soft.     Tenderness: There is no abdominal tenderness.  Musculoskeletal:        General: Tenderness present.     Cervical back: Normal range of motion.     Left knee: Bony tenderness present. Decreased range of motion.     Comments: Left knee is tender to palpation both medial and lateral joint spaces without palpable deformity.  Patient holds the knee joint in flexion, he cannot actively or nor can I passively extend the knee to full extension.  Of note review of chart and prior exams of this knee, this appears to be his baseline.  Skin:    General: Skin  is warm and dry.  Neurological:     Mental Status: He is alert.     (all labs ordered are listed, but only abnormal results are displayed) Labs Reviewed - No data to display  EKG: None  Radiology: No results found. Results for orders placed or performed during the hospital encounter of 11/22/23  Urinalysis, Routine w reflex microscopic -Urine, Clean Catch   Collection Time: 11/22/23  4:55 PM  Result Value Ref Range   Color, Urine YELLOW YELLOW   APPearance CLEAR CLEAR   Specific Gravity, Urine 1.026 1.005 - 1.030   pH 5.0 5.0 - 8.0   Glucose, UA >=500 (A) NEGATIVE mg/dL   Hgb urine dipstick NEGATIVE NEGATIVE   Bilirubin Urine NEGATIVE NEGATIVE   Ketones, ur NEGATIVE NEGATIVE mg/dL   Protein, ur 30 (A) NEGATIVE mg/dL   Nitrite NEGATIVE NEGATIVE   Leukocytes,Ua NEGATIVE NEGATIVE   RBC / HPF 0-5 0 - 5 RBC/hpf   WBC, UA 0-5 0 - 5 WBC/hpf   Bacteria, UA NONE SEEN NONE SEEN   Squamous Epithelial / HPF 0-5 0 - 5 /HPF   Mucus PRESENT   Resp panel by RT-PCR (RSV, Flu A&B, Covid) Urine, Clean Catch   Collection Time: 11/22/23  5:18 PM   Specimen: Urine, Clean Catch; Nasal Swab  Result Value Ref Range   SARS  Coronavirus 2 by RT PCR NEGATIVE NEGATIVE   Influenza A by PCR NEGATIVE NEGATIVE   Influenza B by PCR NEGATIVE NEGATIVE   Resp Syncytial Virus by PCR NEGATIVE NEGATIVE  CBC with Differential   Collection Time: 11/22/23  5:41 PM  Result Value Ref Range   WBC 11.2 (H) 4.0 - 10.5 K/uL   RBC 5.55 4.22 - 5.81 MIL/uL   Hemoglobin 17.0 13.0 - 17.0 g/dL   HCT 48.8 60.9 - 47.9 %   MCV 92.1 80.0 - 100.0 fL   MCH 30.6 26.0 - 34.0 pg   MCHC 33.3 30.0 - 36.0 g/dL   RDW 86.2 88.4 - 84.4 %   Platelets 218 150 - 400 K/uL   nRBC 0.0 0.0 - 0.2 %   Neutrophils Relative % 80 %   Neutro Abs 9.0 (H) 1.7 - 7.7 K/uL   Lymphocytes Relative 13 %   Lymphs Abs 1.5 0.7 - 4.0 K/uL   Monocytes Relative 6 %   Monocytes Absolute 0.7 0.1 - 1.0 K/uL   Eosinophils Relative 1 %   Eosinophils Absolute 0.1 0.0 - 0.5 K/uL   Basophils Relative 0 %   Basophils Absolute 0.0 0.0 - 0.1 K/uL   Immature Granulocytes 0 %   Abs Immature Granulocytes 0.05 0.00 - 0.07 K/uL  Comprehensive metabolic panel   Collection Time: 11/22/23  5:41 PM  Result Value Ref Range   Sodium 133 (L) 135 - 145 mmol/L   Potassium 4.4 3.5 - 5.1 mmol/L   Chloride 99 98 - 111 mmol/L   CO2 23 22 - 32 mmol/L   Glucose, Bld 147 (H) 70 - 99 mg/dL   BUN 15 8 - 23 mg/dL   Creatinine, Ser 9.17 0.61 - 1.24 mg/dL   Calcium  9.7 8.9 - 10.3 mg/dL   Total Protein 7.6 6.5 - 8.1 g/dL   Albumin 4.0 3.5 - 5.0 g/dL   AST 23 15 - 41 U/L   ALT 22 0 - 44 U/L   Alkaline Phosphatase 50 38 - 126 U/L   Total Bilirubin 1.7 (H) 0.0 - 1.2 mg/dL   GFR, Estimated >39 >39 mL/min  Anion gap 11 5 - 15  Lipase, blood   Collection Time: 11/22/23  5:41 PM  Result Value Ref Range   Lipase 29 11 - 51 U/L  I-Stat CG4 Lactic Acid   Collection Time: 11/22/23  5:54 PM  Result Value Ref Range   Lactic Acid, Venous 1.7 0.5 - 1.9 mmol/L  CBG monitoring, ED   Collection Time: 11/23/23  7:07 PM  Result Value Ref Range   Glucose-Capillary 213 (H) 70 - 99 mg/dL  CBG monitoring,  ED   Collection Time: 11/23/23  9:37 PM  Result Value Ref Range   Glucose-Capillary 200 (H) 70 - 99 mg/dL  CBG monitoring, ED   Collection Time: 11/24/23  7:34 AM  Result Value Ref Range   Glucose-Capillary 175 (H) 70 - 99 mg/dL  CBG monitoring, ED   Collection Time: 11/24/23  1:03 PM  Result Value Ref Range   Glucose-Capillary 216 (H) 70 - 99 mg/dL  CBG monitoring, ED   Collection Time: 11/24/23  5:05 PM  Result Value Ref Range   Glucose-Capillary 350 (H) 70 - 99 mg/dL  CBG monitoring, ED   Collection Time: 11/24/23  5:41 PM  Result Value Ref Range   Glucose-Capillary 320 (H) 70 - 99 mg/dL  CBG monitoring, ED   Collection Time: 11/24/23 10:13 PM  Result Value Ref Range   Glucose-Capillary 227 (H) 70 - 99 mg/dL  CBG monitoring, ED   Collection Time: 11/25/23  8:08 AM  Result Value Ref Range   Glucose-Capillary 197 (H) 70 - 99 mg/dL  CBG monitoring, ED   Collection Time: 11/25/23 12:40 PM  Result Value Ref Range   Glucose-Capillary 332 (H) 70 - 99 mg/dL  CBG monitoring, ED   Collection Time: 11/25/23  5:05 PM  Result Value Ref Range   Glucose-Capillary 306 (H) 70 - 99 mg/dL  CBG monitoring, ED   Collection Time: 11/25/23  9:09 PM  Result Value Ref Range   Glucose-Capillary 153 (H) 70 - 99 mg/dL  CBG monitoring, ED   Collection Time: 11/26/23  7:28 AM  Result Value Ref Range   Glucose-Capillary 177 (H) 70 - 99 mg/dL  CBG monitoring, ED   Collection Time: 11/26/23  1:12 PM  Result Value Ref Range   Glucose-Capillary 192 (H) 70 - 99 mg/dL  CBG monitoring, ED   Collection Time: 11/26/23  6:32 PM  Result Value Ref Range   Glucose-Capillary 175 (H) 70 - 99 mg/dL  CBG monitoring, ED   Collection Time: 11/26/23  9:33 PM  Result Value Ref Range   Glucose-Capillary 170 (H) 70 - 99 mg/dL  CBG monitoring, ED   Collection Time: 11/27/23  7:34 AM  Result Value Ref Range   Glucose-Capillary 193 (H) 70 - 99 mg/dL  CBG monitoring, ED   Collection Time: 11/27/23 12:02 PM   Result Value Ref Range   Glucose-Capillary 225 (H) 70 - 99 mg/dL  CBG monitoring, ED   Collection Time: 11/27/23  5:29 PM  Result Value Ref Range   Glucose-Capillary 220 (H) 70 - 99 mg/dL  CBG monitoring, ED   Collection Time: 11/27/23  9:18 PM  Result Value Ref Range   Glucose-Capillary 153 (H) 70 - 99 mg/dL  CBG monitoring, ED   Collection Time: 11/28/23  7:53 AM  Result Value Ref Range   Glucose-Capillary 180 (H) 70 - 99 mg/dL  CBG monitoring, ED   Collection Time: 11/28/23 11:53 AM  Result Value Ref Range   Glucose-Capillary 161 (H) 70 -  99 mg/dL  CBG monitoring, ED   Collection Time: 11/28/23  5:50 PM  Result Value Ref Range   Glucose-Capillary 162 (H) 70 - 99 mg/dL  CBG monitoring, ED   Collection Time: 11/28/23  9:24 PM  Result Value Ref Range   Glucose-Capillary 172 (H) 70 - 99 mg/dL  CBG monitoring, ED   Collection Time: 11/29/23  8:15 AM  Result Value Ref Range   Glucose-Capillary 186 (H) 70 - 99 mg/dL   CT Maxillofacial Wo Contrast Result Date: 12/31/2023 EXAM: CT OF THE FACE WITHOUT CONTRAST 12/31/2023 04:27:50 PM TECHNIQUE: CT of the face was performed without the administration of intravenous contrast. Multiplanar reformatted images are provided for review. Automated exposure control, iterative reconstruction, and/or weight based adjustment of the mA/kV was utilized to reduce the radiation dose to as low as reasonably achievable. COMPARISON: None available. CLINICAL HISTORY: Facial trauma, blunt. FINDINGS: FACIAL BONES: No acute maxillofacial fracture. No mandibular condyle dislocation. There is rightward bowing of the anterior nasal septum. No suspicious bone lesion. ORBITS: Globes are intact. No acute traumatic injury. No inflammatory change. SINUSES AND MASTOIDS: Focal stranding and soft tissue swelling involving the left maxillary sinus likely related to trauma. No acute abnormality. SOFT TISSUES: Focal stranding and soft tissue swelling involving the left facial  soft tissues overlying the anterior zygomatic arch likely related to trauma. No acute abnormality. IMPRESSION: 1. Focal edema of the left facial soft tissues overlying the anterior zygomatic arch and left maxillary sinus, likely related to trauma. 2. No acute maxillofacial fracture or mandibular condyle dislocation. Electronically signed by: Donnice Mania MD 12/31/2023 04:46 PM EST RP Workstation: [id number]   CT Cervical Spine Wo Contrast Result Date: 12/31/2023 EXAM: CT CERVICAL SPINE WITHOUT CONTRAST 12/31/2023 04:27:50 PM TECHNIQUE: CT of the cervical spine was performed without the administration of intravenous contrast. Multiplanar reformatted images are provided for review. Automated exposure control, iterative reconstruction, and/or weight based adjustment of the mA/kV was utilized to reduce the radiation dose to as low as reasonably achievable. COMPARISON: 10/08/2023 CLINICAL HISTORY: Neck trauma, midline tenderness (Age 44-64y) FINDINGS: CERVICAL SPINE: BONES AND ALIGNMENT: Straightening of the normal cervical lordosis. No evidence of traumatic malalignment. No acute fracture. DEGENERATIVE CHANGES: No high grade osseous spinal canal stenosis. Mild degenerative endplate osteophytes at multiple levels. Facet arthrosis and uncovertebral hypertrophy at multiple levels. Foraminal stenosis greatest on the left at C3-C4 and C4-C5. SOFT TISSUES: No prevertebral soft tissue swelling. IMPRESSION: 1. No acute abnormality of the cervical spine. Electronically signed by: Donnice Mania MD 12/31/2023 04:41 PM EST RP Workstation: [id number]   CT Head Wo Contrast Result Date: 12/31/2023 EXAM: CT HEAD WITHOUT 12/31/2023 04:27:50 PM TECHNIQUE: CT of the head was performed without the administration of intravenous contrast. Automated exposure control, iterative reconstruction, and/or weight based adjustment of the mA/kV was utilized to reduce the radiation dose to as low as reasonably achievable. COMPARISON: 11/23/2023  CLINICAL HISTORY: Head trauma, coagulopathy (Age 74-64y) FINDINGS: BRAIN AND VENTRICLES: No acute intracranial hemorrhage. No mass effect or midline shift. No extra-axial fluid collection. No evidence of acute infarct. Periventricular white matter decreased attenuation consistent with small vessel ischemic changes. Remote left corona radiata infarct. Infarcts in the superior cerebellar vermis. Prominent ventricles, sulci and cisterns consistent with age-related involutional changes. ORBITS: No acute abnormality. SINUSES AND MASTOIDS: No acute abnormality. SOFT TISSUES AND SKULL: No acute skull fracture. No acute soft tissue abnormality. IMPRESSION: 1. No acute intracranial abnormality. 2. Remote left corona radiata infarct. 3. Infarcts in the superior cerebellar vermis. 4.  Chronic small vessel ischemic changes. Electronically signed by: Donnice Mania MD 12/31/2023 04:38 PM EST RP Workstation: [id number]   DG Knee Complete 4 Views Left Result Date: 12/31/2023 CLINICAL DATA:  Left knee pain. EXAM: LEFT KNEE - COMPLETE 4+ VIEW COMPARISON:  11/23/2023. FINDINGS: Positioning suboptimal within the held in flexion. No convincing fracture.  No bone lesion. Small marginal osteophytes from all 3 compartments. Knee joint normally spaced. Small joint effusion. Soft tissues are unremarkable. IMPRESSION: 1. No convincing fracture.  No dislocation. 2. Mild tricompartmental osteoarthritis.  Small joint effusion. Electronically Signed   By: Alm Parkins [name]   On: 12/31/2023 16:31     Procedures   Medications Ordered in the ED  traMADol  (ULTRAM ) tablet 50 mg (50 mg Oral Given 12/31/23 1558)  oxyCODONE -acetaminophen  (PERCOCET/ROXICET) 5-325 MG per tablet 1 tablet (1 tablet Oral Given 12/31/23 1656)                                    Medical Decision Making Patient presenting with mechanical fall out of his wheelchair just prior to arrival striking his left cheek on furniture, also has acute on chronic pain in the  left knee with his fall.  His exam is otherwise unremarkable.  Imaging as outlined below, no acute fractures or intracranial injury from today's fall.  He is currently taking as needed tramadol  according to his MAR, patient states it has not been effective for his new and his chronic pain.  I will prescribe him a short course of oxycodone  in place of the tramadol .  Imaging today is reassuring.  Advised follow-up care with his PCP for any persistent or worsening symptoms.  Amount and/or Complexity of Data Reviewed Radiology: ordered.    Details: Radiology was reviewed including CT of his head, C-spine, maxillofacial, no acute injury except for soft tissue swelling of the left cheek.  His knee x-ray was also reviewed, there is some mild to moderate osteoarthritis, no acute findings.  Risk Prescription drug management.        Final diagnoses:  Fall, initial encounter  Minor head injury, initial encounter  Chronic pain of left knee    ED Discharge Orders          Ordered    oxyCODONE -acetaminophen  (PERCOCET/ROXICET) 5-325 MG tablet  Every 6 hours PRN        12/31/23 1706               Roberta Kelly, PA-C 01/03/24 CELESTER Cleotilde Rogue, MD 01/05/24 2033
